# Patient Record
Sex: Female | Born: 1945
Health system: Southern US, Community
[De-identification: ages and names within clinical notes are randomized; demographics above are authoritative.]

## PROBLEM LIST (undated history)

## (undated) DIAGNOSIS — K529 Noninfective gastroenteritis and colitis, unspecified: Secondary | ICD-10-CM

## (undated) DIAGNOSIS — F419 Anxiety disorder, unspecified: Secondary | ICD-10-CM

## (undated) DIAGNOSIS — K8020 Calculus of gallbladder without cholecystitis without obstruction: Secondary | ICD-10-CM

## (undated) DIAGNOSIS — E785 Hyperlipidemia, unspecified: Secondary | ICD-10-CM

## (undated) DIAGNOSIS — R55 Syncope and collapse: Secondary | ICD-10-CM

## (undated) DIAGNOSIS — M545 Low back pain: Secondary | ICD-10-CM

## (undated) DIAGNOSIS — M8580 Other specified disorders of bone density and structure, unspecified site: Secondary | ICD-10-CM

## (undated) DIAGNOSIS — Z974 Presence of external hearing-aid: Secondary | ICD-10-CM

## (undated) DIAGNOSIS — I739 Peripheral vascular disease, unspecified: Secondary | ICD-10-CM

## (undated) DIAGNOSIS — I779 Disorder of arteries and arterioles, unspecified: Secondary | ICD-10-CM

## (undated) DIAGNOSIS — K589 Irritable bowel syndrome without diarrhea: Secondary | ICD-10-CM

## (undated) DIAGNOSIS — I10 Essential (primary) hypertension: Secondary | ICD-10-CM

## (undated) DIAGNOSIS — J302 Other seasonal allergic rhinitis: Secondary | ICD-10-CM

## (undated) DIAGNOSIS — IMO0001 Reserved for inherently not codable concepts without codable children: Secondary | ICD-10-CM

## (undated) DIAGNOSIS — Z91018 Allergy to other foods: Secondary | ICD-10-CM

## (undated) HISTORY — DX: Peripheral vascular disease, unspecified: I73.9

## (undated) HISTORY — DX: Essential (primary) hypertension: I10

## (undated) HISTORY — DX: Low back pain: M54.5

## (undated) HISTORY — DX: Hyperlipidemia, unspecified: E78.5

## (undated) HISTORY — DX: Anxiety disorder, unspecified: F41.9

## (undated) HISTORY — PX: CARPAL TUNNEL RELEASE: SHX101

## (undated) HISTORY — DX: Noninfective gastroenteritis and colitis, unspecified: K52.9

## (undated) HISTORY — DX: Other specified disorders of bone density and structure, unspecified site: M85.80

## (undated) HISTORY — PX: CHOLECYSTECTOMY: SHX55

## (undated) HISTORY — DX: Reserved for inherently not codable concepts without codable children: IMO0001

## (undated) HISTORY — DX: Irritable bowel syndrome, unspecified: K58.9

## (undated) HISTORY — DX: Syncope and collapse: R55

## (undated) HISTORY — DX: Disorder of arteries and arterioles, unspecified: I77.9

## (undated) HISTORY — DX: Calculus of gallbladder without cholecystitis without obstruction: K80.20

## (undated) HISTORY — DX: Allergy to other foods: Z91.018

---

## 2008-03-04 ENCOUNTER — Encounter: Payer: Self-pay | Admitting: Internal Medicine

## 2008-03-04 LAB — CONVERTED CEMR LAB: Pap Smear: NORMAL

## 2008-03-22 ENCOUNTER — Encounter: Payer: Self-pay | Admitting: Internal Medicine

## 2008-04-18 ENCOUNTER — Encounter: Payer: Self-pay | Admitting: Internal Medicine

## 2008-04-18 LAB — HM COLONOSCOPY: HM Colonoscopy: NORMAL

## 2008-04-30 ENCOUNTER — Emergency Department (HOSPITAL_BASED_OUTPATIENT_CLINIC_OR_DEPARTMENT_OTHER): Admission: EM | Admit: 2008-04-30 | Discharge: 2008-04-30 | Payer: Self-pay | Admitting: Emergency Medicine

## 2008-05-19 ENCOUNTER — Encounter: Payer: Self-pay | Admitting: Internal Medicine

## 2008-05-19 LAB — HM MAMMOGRAPHY: HM Mammogram: NORMAL

## 2008-05-20 ENCOUNTER — Ambulatory Visit: Payer: Self-pay | Admitting: Internal Medicine

## 2008-05-20 DIAGNOSIS — I10 Essential (primary) hypertension: Secondary | ICD-10-CM | POA: Insufficient documentation

## 2008-05-23 LAB — CONVERTED CEMR LAB
BUN: 17 mg/dL (ref 6–23)
CO2: 31 meq/L (ref 19–32)
Calcium: 10.2 mg/dL (ref 8.4–10.5)
Chloride: 95 meq/L — ABNORMAL LOW (ref 96–112)
Creatinine, Ser: 0.9 mg/dL (ref 0.4–1.2)
GFR calc Af Amer: 81 mL/min
GFR calc non Af Amer: 67 mL/min
Glucose, Bld: 105 mg/dL — ABNORMAL HIGH (ref 70–99)
Potassium: 3.9 meq/L (ref 3.5–5.1)
Sodium: 136 meq/L (ref 135–145)

## 2008-06-22 ENCOUNTER — Telehealth (INDEPENDENT_AMBULATORY_CARE_PROVIDER_SITE_OTHER): Payer: Self-pay | Admitting: *Deleted

## 2008-08-19 ENCOUNTER — Encounter (INDEPENDENT_AMBULATORY_CARE_PROVIDER_SITE_OTHER): Payer: Self-pay | Admitting: *Deleted

## 2008-09-06 ENCOUNTER — Telehealth (INDEPENDENT_AMBULATORY_CARE_PROVIDER_SITE_OTHER): Payer: Self-pay | Admitting: *Deleted

## 2008-09-07 ENCOUNTER — Ambulatory Visit: Payer: Self-pay | Admitting: Internal Medicine

## 2008-09-09 ENCOUNTER — Encounter (INDEPENDENT_AMBULATORY_CARE_PROVIDER_SITE_OTHER): Payer: Self-pay | Admitting: *Deleted

## 2008-09-09 LAB — CONVERTED CEMR LAB: TSH: 1.35 microintl units/mL (ref 0.35–5.50)

## 2009-01-03 ENCOUNTER — Ambulatory Visit: Payer: Self-pay | Admitting: Internal Medicine

## 2009-01-05 DIAGNOSIS — R55 Syncope and collapse: Secondary | ICD-10-CM

## 2009-01-05 HISTORY — DX: Syncope and collapse: R55

## 2009-01-09 LAB — CONVERTED CEMR LAB
ALT: 24 units/L (ref 0–35)
AST: 32 units/L (ref 0–37)
Albumin: 3.9 g/dL (ref 3.5–5.2)
Alkaline Phosphatase: 57 units/L (ref 39–117)
BUN: 19 mg/dL (ref 6–23)
Basophils Absolute: 0 10*3/uL (ref 0.0–0.1)
Basophils Relative: 0.2 % (ref 0.0–3.0)
Bilirubin, Direct: 0 mg/dL (ref 0.0–0.3)
CO2: 34 meq/L — ABNORMAL HIGH (ref 19–32)
Calcium: 10.3 mg/dL (ref 8.4–10.5)
Chloride: 96 meq/L (ref 96–112)
Cholesterol: 198 mg/dL (ref 0–200)
Creatinine, Ser: 0.7 mg/dL (ref 0.4–1.2)
Eosinophils Absolute: 0.2 10*3/uL (ref 0.0–0.7)
Eosinophils Relative: 4.8 % (ref 0.0–5.0)
GFR calc non Af Amer: 89.58 mL/min (ref >60)
Glucose, Bld: 87 mg/dL (ref 70–99)
HCT: 36.8 % (ref 36.0–46.0)
HDL: 69.6 mg/dL (ref >39.00)
Hemoglobin: 12.5 g/dL (ref 12.0–15.0)
LDL Cholesterol: 114 mg/dL — ABNORMAL HIGH (ref 0–99)
Lymphocytes Relative: 21.1 % (ref 12.0–46.0)
Lymphs Abs: 0.7 10*3/uL (ref 0.7–4.0)
MCHC: 34.1 g/dL (ref 30.0–36.0)
MCV: 91.7 fL (ref 78.0–100.0)
Monocytes Absolute: 0.2 10*3/uL (ref 0.1–1.0)
Monocytes Relative: 5 % (ref 3.0–12.0)
Neutro Abs: 2.3 10*3/uL (ref 1.4–7.7)
Neutrophils Relative %: 68.9 % (ref 43.0–77.0)
Platelets: 343 10*3/uL (ref 150.0–400.0)
Potassium: 3.7 meq/L (ref 3.5–5.1)
RBC: 4.01 M/uL (ref 3.87–5.11)
RDW: 12.2 % (ref 11.5–14.6)
Sodium: 138 meq/L (ref 135–145)
Total Bilirubin: 0.5 mg/dL (ref 0.3–1.2)
Total CHOL/HDL Ratio: 3
Total Protein: 7.3 g/dL (ref 6.0–8.3)
Triglycerides: 74 mg/dL (ref 0.0–149.0)
VLDL: 14.8 mg/dL (ref 0.0–40.0)
WBC: 3.4 10*3/uL — ABNORMAL LOW (ref 4.5–10.5)

## 2009-01-31 ENCOUNTER — Encounter: Payer: Self-pay | Admitting: Internal Medicine

## 2009-01-31 ENCOUNTER — Inpatient Hospital Stay (HOSPITAL_COMMUNITY): Admission: AD | Admit: 2009-01-31 | Discharge: 2009-02-02 | Payer: Self-pay | Admitting: Internal Medicine

## 2009-01-31 ENCOUNTER — Ambulatory Visit: Payer: Self-pay | Admitting: Internal Medicine

## 2009-01-31 ENCOUNTER — Encounter: Payer: Self-pay | Admitting: Emergency Medicine

## 2009-01-31 ENCOUNTER — Ambulatory Visit: Payer: Self-pay | Admitting: Diagnostic Radiology

## 2009-01-31 DIAGNOSIS — R55 Syncope and collapse: Secondary | ICD-10-CM | POA: Insufficient documentation

## 2009-02-01 ENCOUNTER — Encounter: Payer: Self-pay | Admitting: Internal Medicine

## 2009-02-01 ENCOUNTER — Ambulatory Visit: Payer: Self-pay | Admitting: Vascular Surgery

## 2009-02-07 ENCOUNTER — Encounter (INDEPENDENT_AMBULATORY_CARE_PROVIDER_SITE_OTHER): Payer: Self-pay | Admitting: *Deleted

## 2009-02-07 ENCOUNTER — Ambulatory Visit: Payer: Self-pay | Admitting: Internal Medicine

## 2009-02-07 LAB — CONVERTED CEMR LAB: Hemoglobin: 10.4 g/dL

## 2009-02-08 ENCOUNTER — Telehealth: Payer: Self-pay | Admitting: Internal Medicine

## 2009-02-15 ENCOUNTER — Ambulatory Visit: Payer: Self-pay | Admitting: Diagnostic Radiology

## 2009-02-15 ENCOUNTER — Ambulatory Visit (HOSPITAL_BASED_OUTPATIENT_CLINIC_OR_DEPARTMENT_OTHER): Admission: RE | Admit: 2009-02-15 | Discharge: 2009-02-15 | Payer: Self-pay | Admitting: Internal Medicine

## 2009-02-17 ENCOUNTER — Ambulatory Visit: Payer: Self-pay | Admitting: Internal Medicine

## 2009-02-17 DIAGNOSIS — I779 Disorder of arteries and arterioles, unspecified: Secondary | ICD-10-CM | POA: Insufficient documentation

## 2009-02-17 DIAGNOSIS — I739 Peripheral vascular disease, unspecified: Secondary | ICD-10-CM

## 2009-02-18 DIAGNOSIS — E785 Hyperlipidemia, unspecified: Secondary | ICD-10-CM | POA: Insufficient documentation

## 2009-02-20 ENCOUNTER — Telehealth (INDEPENDENT_AMBULATORY_CARE_PROVIDER_SITE_OTHER): Payer: Self-pay | Admitting: *Deleted

## 2009-02-20 LAB — CONVERTED CEMR LAB
BUN: 12 mg/dL (ref 6–23)
Basophils Absolute: 0 10*3/uL (ref 0.0–0.1)
Basophils Relative: 0.1 % (ref 0.0–3.0)
CO2: 32 meq/L (ref 19–32)
Calcium: 9.7 mg/dL (ref 8.4–10.5)
Chloride: 97 meq/L (ref 96–112)
Creatinine, Ser: 0.8 mg/dL (ref 0.4–1.2)
Eosinophils Absolute: 0.3 10*3/uL (ref 0.0–0.7)
Eosinophils Relative: 6.1 % — ABNORMAL HIGH (ref 0.0–5.0)
GFR calc non Af Amer: 76.75 mL/min (ref >60)
Glucose, Bld: 73 mg/dL (ref 70–99)
HCT: 34.7 % — ABNORMAL LOW (ref 36.0–46.0)
Hemoglobin: 11.8 g/dL — ABNORMAL LOW (ref 12.0–15.0)
Lymphocytes Relative: 19.3 % (ref 12.0–46.0)
Lymphs Abs: 0.8 10*3/uL (ref 0.7–4.0)
MCHC: 34 g/dL (ref 30.0–36.0)
MCV: 90.9 fL (ref 78.0–100.0)
Monocytes Absolute: 0.2 10*3/uL (ref 0.1–1.0)
Monocytes Relative: 3.5 % (ref 3.0–12.0)
Neutro Abs: 3 10*3/uL (ref 1.4–7.7)
Neutrophils Relative %: 71 % (ref 43.0–77.0)
Platelets: 367 10*3/uL (ref 150.0–400.0)
Potassium: 3 meq/L — ABNORMAL LOW (ref 3.5–5.1)
RBC: 3.82 M/uL — ABNORMAL LOW (ref 3.87–5.11)
RDW: 12.8 % (ref 11.5–14.6)
Sodium: 136 meq/L (ref 135–145)
WBC: 4.3 10*3/uL — ABNORMAL LOW (ref 4.5–10.5)

## 2009-02-21 ENCOUNTER — Ambulatory Visit: Payer: Self-pay | Admitting: Internal Medicine

## 2009-02-27 ENCOUNTER — Telehealth: Payer: Self-pay | Admitting: Internal Medicine

## 2009-03-01 ENCOUNTER — Encounter: Payer: Self-pay | Admitting: Internal Medicine

## 2009-03-02 ENCOUNTER — Telehealth: Payer: Self-pay | Admitting: Internal Medicine

## 2009-03-08 ENCOUNTER — Ambulatory Visit: Payer: Self-pay | Admitting: Internal Medicine

## 2009-03-09 ENCOUNTER — Telehealth (INDEPENDENT_AMBULATORY_CARE_PROVIDER_SITE_OTHER): Payer: Self-pay | Admitting: *Deleted

## 2009-03-09 ENCOUNTER — Ambulatory Visit (HOSPITAL_BASED_OUTPATIENT_CLINIC_OR_DEPARTMENT_OTHER): Admission: RE | Admit: 2009-03-09 | Discharge: 2009-03-09 | Payer: Self-pay | Admitting: Internal Medicine

## 2009-03-09 ENCOUNTER — Ambulatory Visit: Payer: Self-pay | Admitting: Interventional Radiology

## 2009-03-10 ENCOUNTER — Ambulatory Visit: Payer: Self-pay | Admitting: Internal Medicine

## 2009-03-10 LAB — CONVERTED CEMR LAB
BUN: 14 mg/dL (ref 6–23)
CO2: 32 meq/L (ref 19–32)
Calcium: 9.8 mg/dL (ref 8.4–10.5)
Chloride: 100 meq/L (ref 96–112)
Creatinine, Ser: 0.7 mg/dL (ref 0.4–1.2)
GFR calc non Af Amer: 89.53 mL/min (ref >60)
Glucose, Bld: 98 mg/dL (ref 70–99)
Hemoglobin: 12.6 g/dL (ref 12.0–15.0)
Potassium: 3.1 meq/L — ABNORMAL LOW (ref 3.5–5.1)
Sodium: 138 meq/L (ref 135–145)

## 2009-03-22 ENCOUNTER — Telehealth: Payer: Self-pay | Admitting: Internal Medicine

## 2009-03-24 ENCOUNTER — Ambulatory Visit: Payer: Self-pay | Admitting: Internal Medicine

## 2009-03-28 LAB — CONVERTED CEMR LAB: Potassium: 3.1 meq/L — ABNORMAL LOW (ref 3.5–5.1)

## 2009-04-06 DIAGNOSIS — IMO0001 Reserved for inherently not codable concepts without codable children: Secondary | ICD-10-CM

## 2009-04-06 HISTORY — DX: Reserved for inherently not codable concepts without codable children: IMO0001

## 2009-04-07 ENCOUNTER — Ambulatory Visit: Payer: Self-pay | Admitting: Internal Medicine

## 2009-04-07 ENCOUNTER — Telehealth: Payer: Self-pay | Admitting: Internal Medicine

## 2009-04-07 LAB — CONVERTED CEMR LAB: Potassium: 5 meq/L (ref 3.5–5.1)

## 2009-04-17 ENCOUNTER — Telehealth (INDEPENDENT_AMBULATORY_CARE_PROVIDER_SITE_OTHER): Payer: Self-pay | Admitting: *Deleted

## 2009-04-17 ENCOUNTER — Ambulatory Visit: Payer: Self-pay | Admitting: Internal Medicine

## 2009-04-17 DIAGNOSIS — I498 Other specified cardiac arrhythmias: Secondary | ICD-10-CM | POA: Insufficient documentation

## 2009-04-20 ENCOUNTER — Telehealth (INDEPENDENT_AMBULATORY_CARE_PROVIDER_SITE_OTHER): Payer: Self-pay | Admitting: *Deleted

## 2009-04-21 ENCOUNTER — Ambulatory Visit: Payer: Self-pay | Admitting: Internal Medicine

## 2009-04-24 ENCOUNTER — Encounter: Payer: Self-pay | Admitting: Cardiovascular Disease

## 2009-04-24 ENCOUNTER — Ambulatory Visit: Payer: Self-pay

## 2009-04-26 ENCOUNTER — Telehealth: Payer: Self-pay | Admitting: Internal Medicine

## 2009-04-26 LAB — CONVERTED CEMR LAB: Potassium: 3.6 meq/L (ref 3.5–5.1)

## 2009-05-11 ENCOUNTER — Ambulatory Visit: Payer: Self-pay | Admitting: Internal Medicine

## 2009-05-22 ENCOUNTER — Ambulatory Visit: Payer: Self-pay | Admitting: Internal Medicine

## 2009-05-25 LAB — CONVERTED CEMR LAB
BUN: 12 mg/dL (ref 6–23)
CO2: 33 meq/L — ABNORMAL HIGH (ref 19–32)
Calcium: 10.2 mg/dL (ref 8.4–10.5)
Chloride: 97 meq/L (ref 96–112)
Creatinine, Ser: 0.7 mg/dL (ref 0.4–1.2)
GFR calc non Af Amer: 89.47 mL/min (ref >60)
Glucose, Bld: 87 mg/dL (ref 70–99)
Potassium: 4.5 meq/L (ref 3.5–5.1)
Sodium: 137 meq/L (ref 135–145)

## 2009-05-29 ENCOUNTER — Encounter: Payer: Self-pay | Admitting: Internal Medicine

## 2009-07-03 ENCOUNTER — Encounter: Payer: Self-pay | Admitting: Internal Medicine

## 2009-07-19 ENCOUNTER — Ambulatory Visit: Payer: Self-pay | Admitting: Internal Medicine

## 2009-07-28 ENCOUNTER — Telehealth: Payer: Self-pay | Admitting: Internal Medicine

## 2009-08-04 ENCOUNTER — Ambulatory Visit: Payer: Self-pay | Admitting: Internal Medicine

## 2009-08-07 LAB — CONVERTED CEMR LAB
ALT: 23 units/L (ref 0–35)
AST: 31 units/L (ref 0–37)
Cholesterol: 148 mg/dL (ref 0–200)
HDL: 58.9 mg/dL (ref >39.00)
LDL Cholesterol: 75 mg/dL (ref 0–99)
Total CHOL/HDL Ratio: 3
Triglycerides: 70 mg/dL (ref 0.0–149.0)
VLDL: 14 mg/dL (ref 0.0–40.0)

## 2009-09-13 IMAGING — US US AORTA SCREENING (MEDICARE)
1 series · 14 of 25 positions shown · non-contrast
Comparison: 04/30/2008

CLINICAL DATA: Abdominal mass

AORTA ULTRASOUND SCREENING

[Series 1: us aorta screening (medicare) · 0.22mm/px · 14 of 30 slices shown]
[im 1/30]
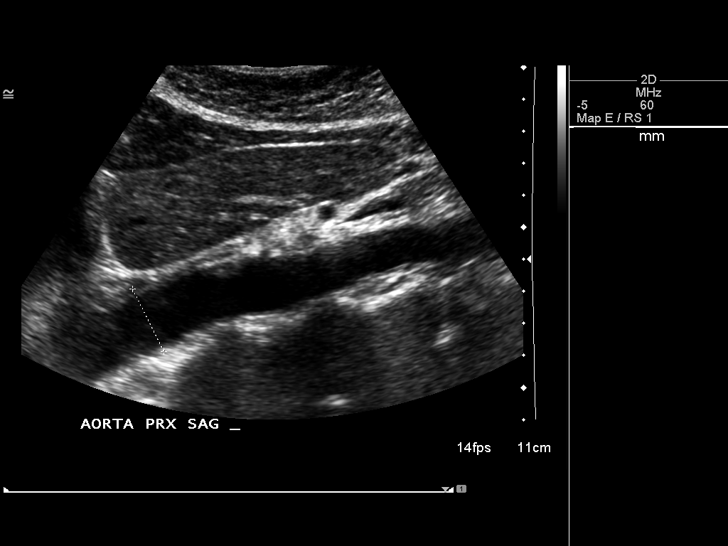
[im 3/30]
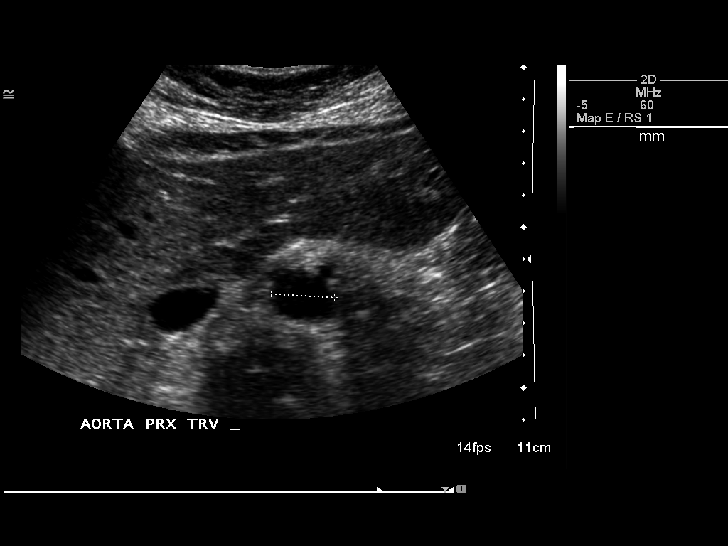
[im 5/30]
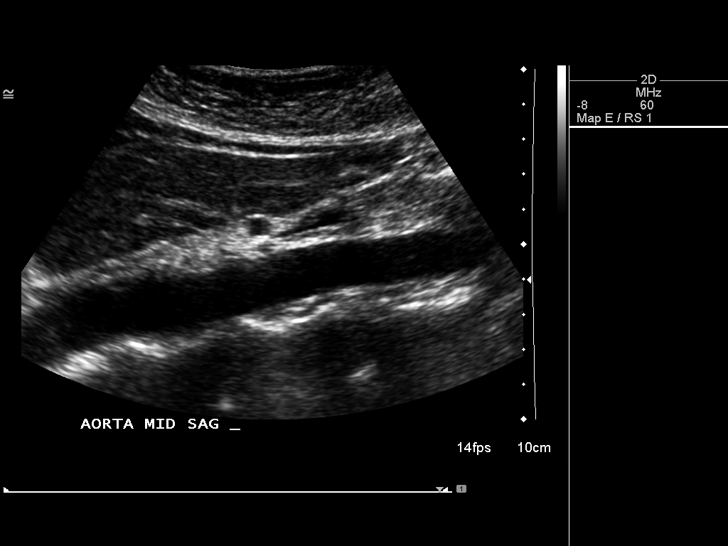
[im 8/30]
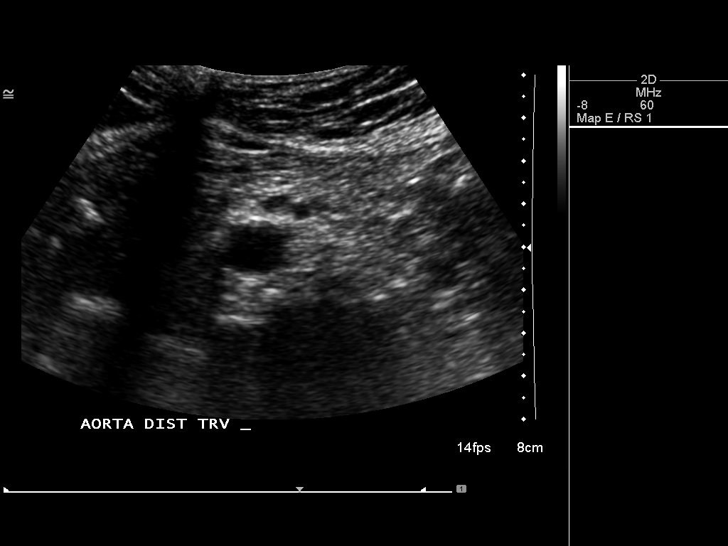
[im 10/30]
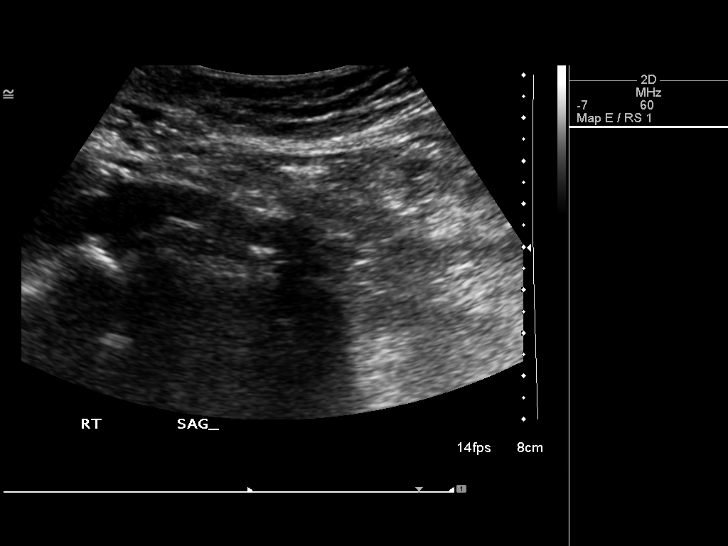
[im 11/30]
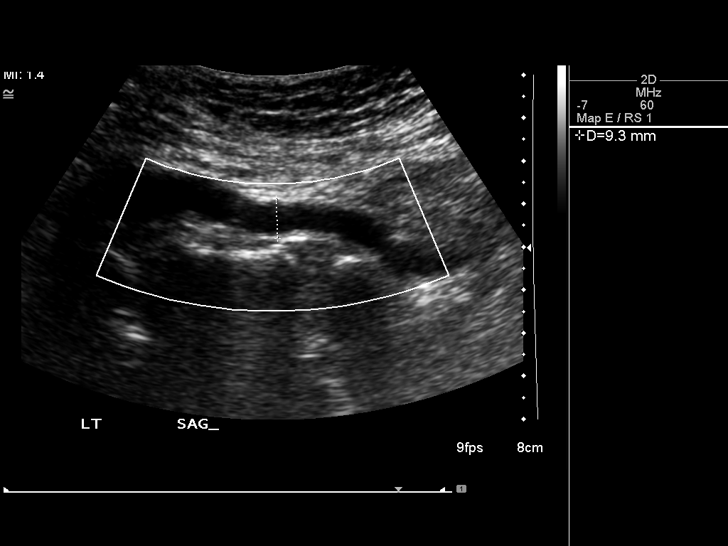
[im 14/30]
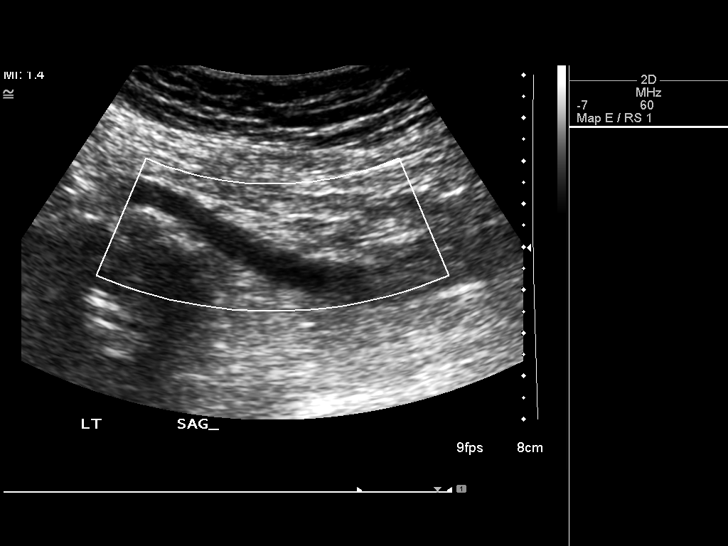
[im 16/30]
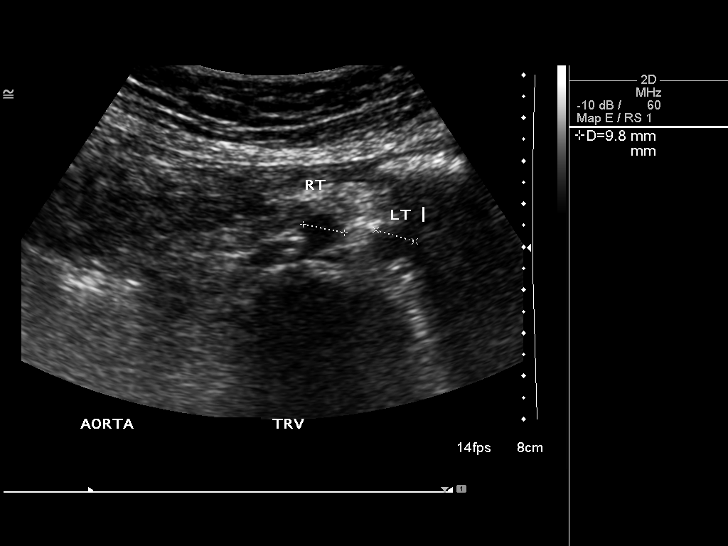
[im 19/30]
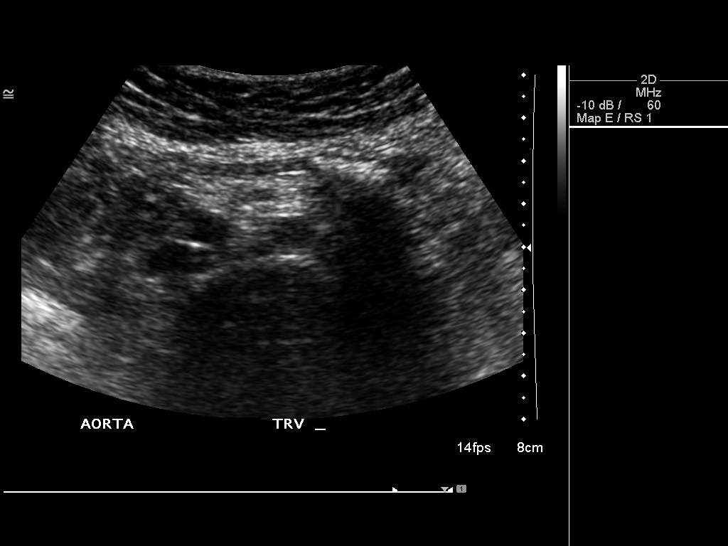
[im 20/30]
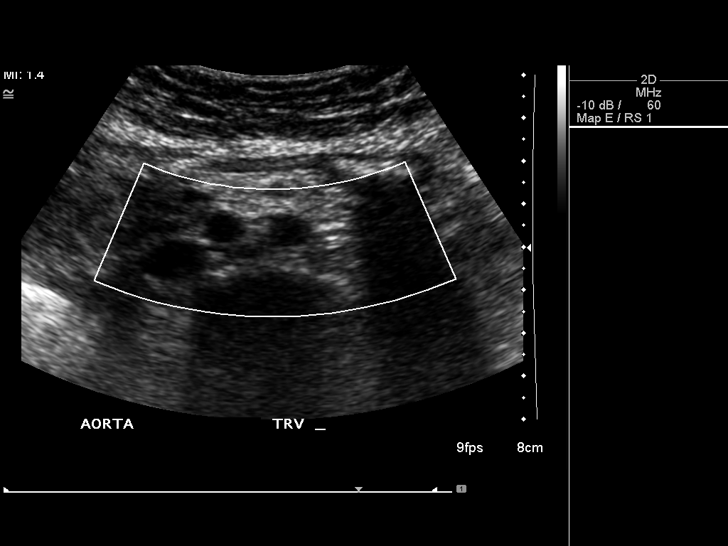
[im 22/30]
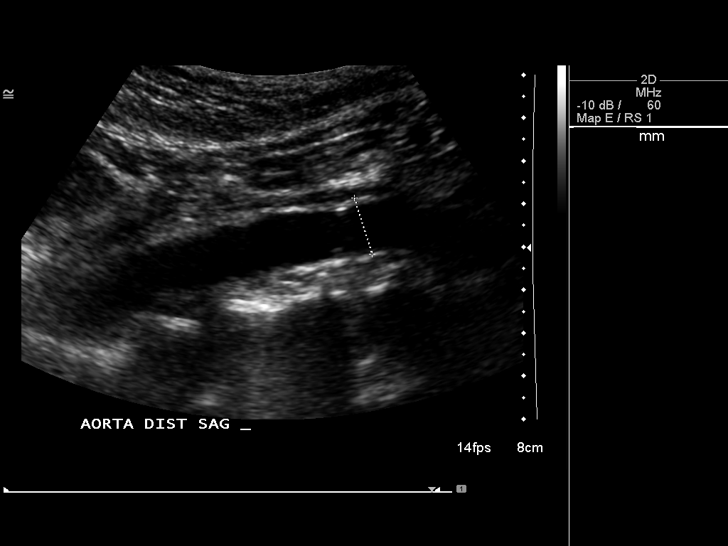
[im 25/30]
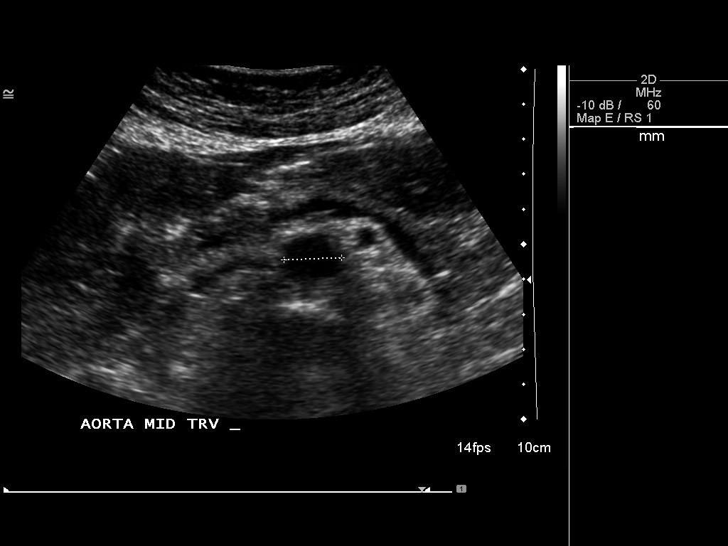
[im 27/30]
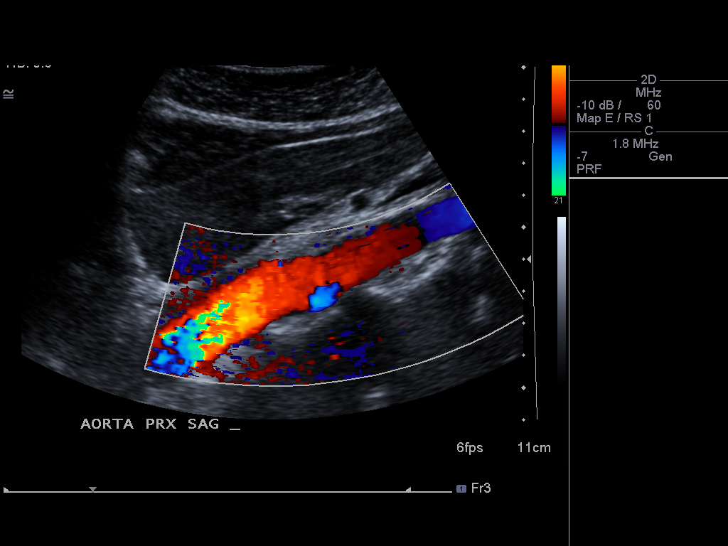
[im 30/30]
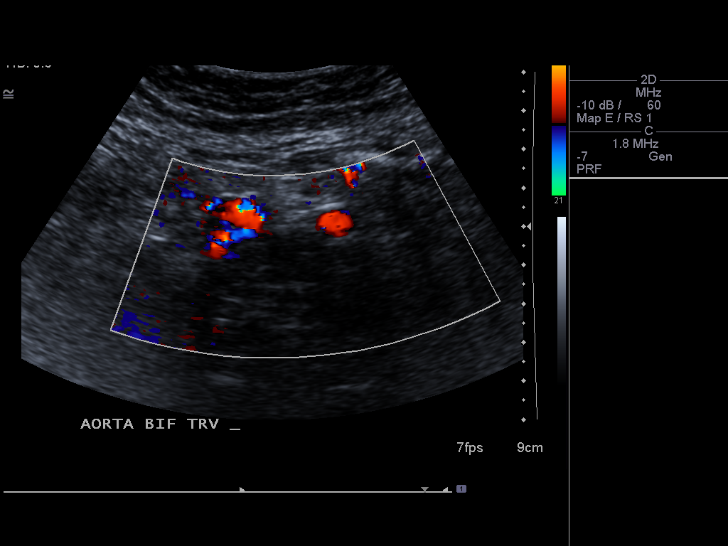

[14 of 25 positions shown; findings below may reference images not displayed]

FINDINGS: Abdominal Aorta:
              Maximum AP diameter:   2.2 cm

        Maximum TRV diameter:  2.0 cm.

Right Common Iliac Artery:  0.9 cm

Left Common Iliac Artery:  0.9 cm
IMPRESSION: Negative for abdominal aortic aneurysm

## 2009-10-02 ENCOUNTER — Telehealth: Payer: Self-pay | Admitting: Internal Medicine

## 2009-11-29 ENCOUNTER — Encounter (INDEPENDENT_AMBULATORY_CARE_PROVIDER_SITE_OTHER): Payer: Self-pay | Admitting: *Deleted

## 2009-11-29 ENCOUNTER — Telehealth: Payer: Self-pay | Admitting: Internal Medicine

## 2009-12-05 DIAGNOSIS — M545 Low back pain, unspecified: Secondary | ICD-10-CM

## 2009-12-05 HISTORY — DX: Low back pain, unspecified: M54.50

## 2009-12-11 ENCOUNTER — Telehealth: Payer: Self-pay | Admitting: Internal Medicine

## 2009-12-29 ENCOUNTER — Ambulatory Visit: Payer: Self-pay | Admitting: Internal Medicine

## 2009-12-29 ENCOUNTER — Telehealth: Payer: Self-pay | Admitting: Internal Medicine

## 2010-01-01 ENCOUNTER — Telehealth (INDEPENDENT_AMBULATORY_CARE_PROVIDER_SITE_OTHER): Payer: Self-pay | Admitting: *Deleted

## 2010-01-01 ENCOUNTER — Telehealth: Payer: Self-pay | Admitting: Internal Medicine

## 2010-01-02 ENCOUNTER — Ambulatory Visit: Payer: Self-pay | Admitting: Diagnostic Radiology

## 2010-01-02 ENCOUNTER — Telehealth (INDEPENDENT_AMBULATORY_CARE_PROVIDER_SITE_OTHER): Payer: Self-pay | Admitting: *Deleted

## 2010-01-02 ENCOUNTER — Ambulatory Visit (HOSPITAL_BASED_OUTPATIENT_CLINIC_OR_DEPARTMENT_OTHER): Admission: RE | Admit: 2010-01-02 | Discharge: 2010-01-02 | Payer: Self-pay | Admitting: Internal Medicine

## 2010-01-03 ENCOUNTER — Encounter (INDEPENDENT_AMBULATORY_CARE_PROVIDER_SITE_OTHER): Payer: Self-pay | Admitting: *Deleted

## 2010-01-10 ENCOUNTER — Telehealth: Payer: Self-pay | Admitting: Internal Medicine

## 2010-01-12 ENCOUNTER — Ambulatory Visit: Payer: Self-pay | Admitting: Diagnostic Radiology

## 2010-01-12 ENCOUNTER — Ambulatory Visit (HOSPITAL_BASED_OUTPATIENT_CLINIC_OR_DEPARTMENT_OTHER): Admission: RE | Admit: 2010-01-12 | Discharge: 2010-01-12 | Payer: Self-pay | Admitting: Internal Medicine

## 2010-01-19 ENCOUNTER — Telehealth (INDEPENDENT_AMBULATORY_CARE_PROVIDER_SITE_OTHER): Payer: Self-pay | Admitting: *Deleted

## 2010-02-02 ENCOUNTER — Telehealth (INDEPENDENT_AMBULATORY_CARE_PROVIDER_SITE_OTHER): Payer: Self-pay | Admitting: *Deleted

## 2010-02-10 ENCOUNTER — Ambulatory Visit (HOSPITAL_BASED_OUTPATIENT_CLINIC_OR_DEPARTMENT_OTHER): Admission: RE | Admit: 2010-02-10 | Discharge: 2010-02-10 | Payer: Self-pay | Admitting: Unknown Physician Specialty

## 2010-02-10 ENCOUNTER — Ambulatory Visit: Payer: Self-pay | Admitting: Diagnostic Radiology

## 2010-03-29 ENCOUNTER — Telehealth (INDEPENDENT_AMBULATORY_CARE_PROVIDER_SITE_OTHER): Payer: Self-pay | Admitting: *Deleted

## 2010-04-06 ENCOUNTER — Encounter: Payer: Self-pay | Admitting: Internal Medicine

## 2010-04-13 ENCOUNTER — Ambulatory Visit: Payer: Self-pay | Admitting: Internal Medicine

## 2010-04-16 ENCOUNTER — Encounter: Admission: RE | Admit: 2010-04-16 | Discharge: 2010-05-23 | Payer: Self-pay | Admitting: Sports Medicine

## 2010-04-17 ENCOUNTER — Ambulatory Visit (HOSPITAL_BASED_OUTPATIENT_CLINIC_OR_DEPARTMENT_OTHER): Admission: RE | Admit: 2010-04-17 | Discharge: 2010-04-17 | Payer: Self-pay | Admitting: Internal Medicine

## 2010-04-17 ENCOUNTER — Ambulatory Visit: Payer: Self-pay | Admitting: Diagnostic Radiology

## 2010-04-18 LAB — CONVERTED CEMR LAB
ALT: 28 units/L (ref 0–35)
AST: 39 units/L — ABNORMAL HIGH (ref 0–37)
BUN: 17 mg/dL (ref 6–23)
Basophils Absolute: 0 10*3/uL (ref 0.0–0.1)
Basophils Relative: 0 % (ref 0–1)
CO2: 24 meq/L (ref 19–32)
Calcium: 10 mg/dL (ref 8.4–10.5)
Chloride: 97 meq/L (ref 96–112)
Cholesterol: 181 mg/dL (ref 0–200)
Creatinine, Ser: 0.73 mg/dL (ref 0.40–1.20)
Eosinophils Absolute: 0.1 10*3/uL (ref 0.0–0.7)
Eosinophils Relative: 2 % (ref 0–5)
Glucose, Bld: 87 mg/dL (ref 70–99)
HCT: 36.9 % (ref 36.0–46.0)
HDL: 78 mg/dL (ref >39)
Hemoglobin: 12 g/dL (ref 12.0–15.0)
LDL Cholesterol: 86 mg/dL (ref 0–99)
Lymphocytes Relative: 22 % (ref 12–46)
Lymphs Abs: 1.3 10*3/uL (ref 0.7–4.0)
MCHC: 32.5 g/dL (ref 30.0–36.0)
MCV: 93.2 fL (ref 78.0–100.0)
Monocytes Absolute: 0.4 10*3/uL (ref 0.1–1.0)
Monocytes Relative: 6 % (ref 3–12)
Neutro Abs: 4.3 10*3/uL (ref 1.7–7.7)
Neutrophils Relative %: 70 % (ref 43–77)
Platelets: 359 10*3/uL (ref 150–400)
Potassium: 5 meq/L (ref 3.5–5.3)
RBC: 3.96 M/uL (ref 3.87–5.11)
RDW: 14.2 % (ref 11.5–15.5)
Sodium: 135 meq/L (ref 135–145)
TSH: 2.663 microintl units/mL (ref 0.350–4.500)
Total CHOL/HDL Ratio: 2.3
Triglycerides: 87 mg/dL (ref <150)
VLDL: 17 mg/dL (ref 0–40)
Vit D, 25-Hydroxy: 54 ng/mL (ref 30–89)
WBC: 6.1 10*3/uL (ref 4.0–10.5)

## 2010-04-26 ENCOUNTER — Encounter: Payer: Self-pay | Admitting: Internal Medicine

## 2010-04-26 ENCOUNTER — Encounter: Admission: RE | Admit: 2010-04-26 | Discharge: 2010-04-26 | Payer: Self-pay | Admitting: Internal Medicine

## 2010-05-09 ENCOUNTER — Telehealth: Payer: Self-pay | Admitting: Internal Medicine

## 2010-05-18 ENCOUNTER — Telehealth (INDEPENDENT_AMBULATORY_CARE_PROVIDER_SITE_OTHER): Payer: Self-pay | Admitting: *Deleted

## 2010-10-18 ENCOUNTER — Telehealth (INDEPENDENT_AMBULATORY_CARE_PROVIDER_SITE_OTHER): Payer: Self-pay | Admitting: *Deleted

## 2010-10-22 ENCOUNTER — Encounter: Payer: Self-pay | Admitting: Internal Medicine

## 2010-10-22 ENCOUNTER — Ambulatory Visit
Admission: RE | Admit: 2010-10-22 | Discharge: 2010-10-22 | Payer: Self-pay | Source: Home / Self Care | Attending: Internal Medicine | Admitting: Internal Medicine

## 2010-10-22 ENCOUNTER — Other Ambulatory Visit: Payer: Self-pay | Admitting: Internal Medicine

## 2010-10-22 DIAGNOSIS — M8580 Other specified disorders of bone density and structure, unspecified site: Secondary | ICD-10-CM | POA: Insufficient documentation

## 2010-10-22 LAB — BASIC METABOLIC PANEL
BUN: 24 mg/dL — ABNORMAL HIGH (ref 6–23)
CO2: 29 mEq/L (ref 19–32)
Calcium: 9.7 mg/dL (ref 8.4–10.5)
Chloride: 98 mEq/L (ref 96–112)
Creatinine, Ser: 0.9 mg/dL (ref 0.4–1.2)
GFR: 65.8 mL/min (ref >60.00)
Glucose, Bld: 92 mg/dL (ref 70–99)
Potassium: 4.7 mEq/L (ref 3.5–5.1)
Sodium: 134 mEq/L — ABNORMAL LOW (ref 135–145)

## 2010-10-29 ENCOUNTER — Encounter: Payer: Self-pay | Admitting: Internal Medicine

## 2010-11-06 NOTE — Assessment & Plan Note (Signed)
Summary: CPX//KN--Rm 11   Vital Signs:  Patient profile:   65 year old female Height:      65 inches Weight:      134.38 pounds BMI:     22.44 Temp:     97.2 degrees F oral Pulse rate:   84 / minute Pulse rhythm:   regular Resp:     16 per minute BP sitting:   148 / 98  (right arm) Cuff size:   regular  Vitals Entered By: Mervin Kung CMA Duncan Dull) (April 13, 2010 1:58 PM) CC: Room 11  Physical, fasting.   Comments Pt has completed Prednisone and Vicodin.  Pt states she starts physical therapy on Monday for her shoulder and back. Pt states she had bone density ?years ago. Does not remember date of last mammogram but it was sometime last year.  Just had pap smear in 04/11/10 but doesn't have result yet.   History of Present Illness: CPX was seen 3-11 w/  back pain, MRI 5-11, showed 2 disks w/  problems, s/p 2 shots pain is gone also had shoulder pain  will start PT for both soon      Allergies: 1)  ! * Nyquil 2)  ! * Flecainide  Past History:  Past Medical History: Syncope 01-2009 ----> holter atrial tachycardia normal stress test 04-2009 G3 P3 Hypertension Carotid Dz per u/s 01-2009 Hyperlipidemia LBP 3-11, better after a local injection  Past Surgical History: Reviewed history from 05/20/2008 and no changes required. Cholecystectomy Carpal tunnel release  Family History: Reviewed history from 01/03/2009 and no changes required. CAD - M onset on her 65s  HTN - M family Stroke - M DM - M breast Ca - M,  Aunt, M colon Ca - no father deceased  - cerebral hemorrhage  Social History: Married 3 children Never Smoked Alcohol use-yes occupation-- Engineer, site diet-- healthy  exerc ise-- limited d/t pain  Review of Systems CV:  Denies chest pain or discomfort and swelling of feet. Resp:  Denies shortness of breath; occasionally cough, better  usually after treatment for allergies . GI:  Denies bloody stools, nausea, and vomiting. GU:  no vag d/c or  bleed . Psych:  Denies anxiety and depression.  Physical Exam  General:  alert, well-developed, and well-nourished.   Neck:  no masses, no thyromegaly, and normal carotid upstroke.   Lungs:  normal respiratory effort, no intercostal retractions, no accessory muscle use, and normal breath sounds.   Heart:  normal rate, regular rhythm, and no murmur.   Abdomen:  soft, non-tender, normal bowel sounds, no distention, no masses, no guarding, and no rigidity.   Extremities:  no pretibial edema bilaterally  Neurologic:  alert & oriented X3, strength normal in all extremities, and gait normal.   Psych:  Cognition and judgment appear intact. Alert and cooperative with normal attention span and concentration.     Impression & Recommendations:  Problem # 1:  HEALTH SCREENING (ICD-V70.0) TD 5-10 pneumonia shot -- declined diet and  exercise discussed  PAP per gyn last MMG 9-10  Colonoscopy:  04/18/2008, hemorrhoids. GI rec iFOB starting 2012, Cscope 2019   last DEXA?  ordering one --- [facility] to go to [facility]  Orders: Venipuncture ([address]) T-Vitamin D (25-Hydroxy) [phone]) Radiology Referral (Radiology)  Problem # 2:  CAROTID ARTERY DISEASE (ICD-433.10) carotid ultrasound  01/2009------->   Right: Mild to moderate, homogeneous plaque noted in the proximal  ICA with 40-60% stenosis. Left: Mild, mixed plaque noted in the  proximal ICA with 60-80% (lower end of range) stenosis. ECA stenosis  noted.  plan: Ordered in a  carotid ultrasound----- likes it done at [facility]  Her updated medication list for this problem includes:    Aspirin Ec 325 Mg Tbec (Aspirin) .Marland Kitchen... 1 a day  Orders: Radiology Referral (Radiology)  Problem # 3:  chronic medical problems ordering labs Followup in 4 months  Complete Medication List: 1)  Lotrel 5-10 Mg Caps (Amlodipine besy-benazepril hcl) .Marland Kitchen.. 1 by mouth once daily 2)  Potassium Chloride 100  .... Take 1 tab once daily as directed 3)   Hydrochlorothiazide 12.5 Mg Caps (Hydrochlorothiazide) .Marland Kitchen.. 1 by mouth once daily 4)  Evista 60 Mg Tabs (Raloxifene hcl) .... Once daily 5)  Simvastatin 20 Mg Tabs (Simvastatin) .Marland Kitchen.. 1 by mouth qhs 6)  Clonazepam 0.5 Mg Tabs (Clonazepam) .... Take 1 tab p.o. t.i.d. p.r.n. 7)  Flonase 50 Mcg/act Susp (Fluticasone propionate) .... As needed 8)  Loratadine 10 Mg Tabs (Loratadine) .... As needed 9)  Aspirin Ec 325 Mg Tbec (Aspirin) .Marland Kitchen.. 1 a day 10)  One Source Mvi/d  11)  Propecia 1 Mg Tabs (Finasteride) .... Once daily 12)  Colon Health Probiotic Supp  13)  Anamantle Hc 3-0.5 % Crea (Lidocaine-hydrocortisone ace) .... Apply as needed  Patient Instructions: 1)  Please schedule a follow-up appointment in 4 months .   Current Allergies (reviewed today): ! * NYQUIL ! * FLECAINIDE   Preventive Care Screening  Last Tetanus Booster:    Date:  02/04/2009    Results:  Historical

## 2010-11-06 NOTE — Progress Notes (Signed)
Summary: MRI   Phone Note Call from Patient   Caller: Patient Complaint: Urinary/GYN Problems Summary of Call: Pt was seen by orth and was given a injection which help some but now pain has return. PT is request a MRI of rt hip to be order. pt states that she was told by dr kindle if pain return and it went into her back she would need a MRI. Pt states that our office is closer and would like to know if dr Drue Novel is willing to order test versus her having to drive all the way to orth. pls advise.................Marland KitchenFelecia Deloach CMA  February 02, 2010 1:27 PM    Follow-up for Phone Call        pt advise it would be better for orth to order MRI since it was suggested by them and they are aware of pt current condition. pt ok........................Marland KitchenFelecia Deloach CMA  February 02, 2010 1:33 PM

## 2010-11-06 NOTE — Progress Notes (Signed)
Summary: refill  Phone Note Refill Request Message from:  Fax from Pharmacy on May 09, 2010 9:30 AM  Refills Requested: Medication #1:  CLONAZEPAM 0.5 MG  TABS take 1 tab p.o. t.i.d. p.r.n. [facility] - fax [phone]  Initial call taken by: Dannielle Karvonen,  May 09, 2010 9:30 AM  Follow-up for Phone Call        okay #90, 3 refills Follow-up by: [facility] E. Paz MD,  May 09, 2010 1:13 PM    Prescriptions: CLONAZEPAM 0.5 MG  TABS (CLONAZEPAM) take 1 tab p.o. t.i.d. p.r.n.  #90 x 3   Entered by:   Army Fossa CMA   Authorized by:   Nolon Rod. Paz MD   Signed by:   Army Fossa CMA on 05/09/2010   Method used:   Printed then faxed to ...       [facility]  [address] [phone]* (retail)       [address]       [place], [place]  [address]       Ph: [phone] or [phone]       Fax: [phone]   RxID:   [id number]

## 2010-11-06 NOTE — Assessment & Plan Note (Signed)
Summary: pain in leg/kdc   Vital Signs:  Patient profile:   65 year old female Height:      65 inches Weight:      135.4 pounds Temp:     99 degrees F BP sitting:   112 / 80  Vitals Entered By: Shary Decamp (December 29, 2009 9:07 AM) CC: acute only - WALK IN Comments  - rt leg pain (thigh - front), left arm pain  - achy feeling  - no known injury Shary Decamp  December 29, 2009 9:21 AM    History of Present Illness: acute only - WALK IN one week history of pain in the right leg, like a ache  pain starts at the right hip  by the trochanteric bursa and run down to the anterior thigh   x the  last few days goes also down to the right pretibial area pain is worse with walking slightly getting worse Tylenol did not help  review of systems  no known injury, no recent overuse denies fever or rash no back pain no bladder or bowel incontinence  Current Medications (verified): 1)  Lotrel 10-20 Mg Caps (Amlodipine Besy-Benazepril Hcl) .Marland Kitchen.. 1 By Mouth Once Daily 2)  Potassium Chloride 100 .... Take 1 Tab Once Daily As Directed 3)  Hydrochlorothiazide 12.5 Mg Caps (Hydrochlorothiazide) .Marland Kitchen.. 1 By Mouth Once Daily 4)  Evista 60 Mg  Tabs (Raloxifene Hcl) .... Once Daily 5)  Simvastatin 20 Mg Tabs (Simvastatin) .Marland Kitchen.. 1 By Mouth Qhs 6)  Clonazepam 0.5 Mg  Tabs (Clonazepam) .... Take 1 Tab P.o. T.i.d. P.r.n. 7)  Flonase 50 Mcg/act  Susp (Fluticasone Propionate) .... As Needed 8)  Loratadine 10 Mg  Tabs (Loratadine) .... As Needed 9)  Aspirin Ec 325 Mg Tbec (Aspirin) .Marland Kitchen.. 1 A Day 10)  One Source Mvi/d 11)  Propecia 1 Mg  Tabs (Finasteride) .... Once Daily 12)  Colon Health Probiotic Supp 13)  Anamantle Hc 3-0.5 % Crea (Lidocaine-Hydrocortisone Ace) .... Apply As Needed  Allergies (verified): 1)  ! * Nyquil 2)  ! * Flecainide  Past History:  Past Medical History: Reviewed history from 05/11/2009 and no changes required. Syncope 01-2009 ----> holter atrial tachycardia normal stress  test 04-2009 G3 P3 Hypertension Carotid Dz per u/s 01-2009 Hyperlipidemia  Past Surgical History: Reviewed history from 05/20/2008 and no changes required. Cholecystectomy Carpal tunnel release  Social History: Reviewed history from 01/03/2009 and no changes required. Married 3 children Never Smoked Alcohol use-yes  Review of Systems      See HPI  Physical Exam  General:  alert and well-developed.   Abdomen:  soft and non-tender.   Msk:  inspection and palpation of the legs is normal except for mild tenderness in the anterior right thigh. no tender to palpation in the back of Extremities:  no lower extremity edema tender at the right trochanteric area Neurologic:  generalized hypo-reflexia except for the reasons motor exam symmetric and normal Skin:  no rash   Impression & Recommendations:  Problem # 1:  LEG PAIN, RIGHT (ICD-729.5) right leg pain with tenderness over the right trochanteric bursa neurological exam is symmetric, she does have generalized hyporeflexia plan treat as trochanteric bursitis, see instructions  Problem # 2:  HYPOREFLEXIA (ICD-796.1) likely not related to leg pain, unclear etiology neurology referral  Complete Medication List: 1)  Lotrel 10-20 Mg Caps (Amlodipine besy-benazepril hcl) .Marland Kitchen.. 1 by mouth once daily 2)  Potassium Chloride 100  .... Take 1 tab once daily as directed 3)  Hydrochlorothiazide 12.5 Mg Caps (Hydrochlorothiazide) .Marland Kitchen.. 1 by mouth once daily 4)  Evista 60 Mg Tabs (Raloxifene hcl) .... Once daily 5)  Simvastatin 20 Mg Tabs (Simvastatin) .Marland Kitchen.. 1 by mouth qhs 6)  Clonazepam 0.5 Mg Tabs (Clonazepam) .... Take 1 tab p.o. t.i.d. p.r.n. 7)  Flonase 50 Mcg/act Susp (Fluticasone propionate) .... As needed 8)  Loratadine 10 Mg Tabs (Loratadine) .... As needed 9)  Aspirin Ec 325 Mg Tbec (Aspirin) .Marland Kitchen.. 1 a day 10)  One Source Mvi/d  11)  Propecia 1 Mg Tabs (Finasteride) .... Once daily 12)  Colon Health Probiotic Supp  13)   Anamantle Hc 3-0.5 % Crea (Lidocaine-hydrocortisone ace) .... Apply as needed 14)  Prednisone 10 Mg Tabs (Prednisone) .... 4x2,3x2,2x2,1x2 15)  Vicodin 5-500 Mg Tabs (Hydrocodone-acetaminophen) .... One or two by mouth every 8 hours as needed for pain  Patient Instructions: 1)  rest 2)  cold compress 3)  prednisone as prescribed 4)  Vicodin as needed, watch for somnolence  5)  call if no better in two weeks Prescriptions: VICODIN 5-500 MG TABS (HYDROCODONE-ACETAMINOPHEN) one or two by mouth every 8 hours as needed for pain  #40 x 0   Entered and Authorized by:   Nolon Rod. Jahron Hunsinger MD   Signed by:   Nolon Rod. Deshane Cotroneo MD on 12/29/2009   Method used:   Print then Give to Patient   RxID:   [id number] PREDNISONE 10 MG TABS (PREDNISONE) 4x2,3x2,2x2,1x2  #20 x 0   Entered and Authorized by:   Nolon Rod. Derreon Consalvo MD   Signed by:   Nolon Rod. Lucely Leard MD on 12/29/2009   Method used:   Print then Give to Patient   RxID:   [id number]

## 2010-11-06 NOTE — Progress Notes (Signed)
Summary: fyi -   Phone Note Call from Patient   Summary of Call: Patient states that she has only been taking lotrel 5/10 not 10/20.  spoke with pharmacist -- has been getting 5/10 the last several refills.  BP ok - will leave @ 5/10 -- advised to monitor bp Shary Decamp  January 01, 2010 4:15 PM     New/Updated Medications: LOTREL 5-10 MG CAPS (AMLODIPINE BESY-BENAZEPRIL HCL) 1 by mouth once daily Prescriptions: LOTREL 5-10 MG CAPS (AMLODIPINE BESY-BENAZEPRIL HCL) 1 by mouth once daily  #30 x 3   Entered by:   Shary Decamp   Authorized by:   Nolon Rod. Simcha Farrington MD   Signed by:   Shary Decamp on 01/01/2010   Method used:   [facility]        [facility]  [address] [phone]* (retail)       [address]       [place], [place]  [address]       Ph: [phone] or [phone]       Fax: [phone]   RxID:   [id number]

## 2010-11-06 NOTE — Assessment & Plan Note (Signed)
Summary: new pt   Vital Signs:  Patient Profile:   65 Years Old Female Weight:      122.6 pounds Pulse rate:   90 / minute BP sitting:   120 / 70  Vitals Entered By: Shary Decamp (May 20, 2008 9:53 AM)                 Chief Complaint:  new pt to est; pt states that since having colonoscopy on 7/13 -- she has been having abd cramps/diarrhea -- she started taking probiotic supplement.  History of Present Illness: new pt to est;  pt states that since having colonoscopy on 04/18/08 she has been having abd cramps/diarrhea/gas/bloated   went to the ER 7-25....Marland KitchenCHART REVIEWED    CT abd-pelvis:  Abnormal appearance of the colon from the transverse to the   sigmoid is compatible with colitis likely infectious or   inflammatory.  Ischemic colitis is within the differential but felt   much less likely.  WBC was normal, Cdiff was neg  Sodium (NA)                              130        l      135-145          mEq/L  Potassium (K)                            2.8        l      3.5-5.1          mEq/L  Chloride                                 88         l      96-112           mEq/L  CO2                                      34         h      19-32            mEq/L  Glucose                                  110        h      70-99            mg/dL  BUN                                      10                6-23             mg/dL  Creatinine                               .8                0.4-1.2  mg/dL  Bilirubin, Total                         0.6               0.3-1.2          mg/dL  Alkaline Phosphatase                     99                39-117           U/L  SGOT (AST)                               46         h      0-37             U/L  SGPT (ALT)                               50         h      0-35             U/L  Total  Protein                           7.2               6.0-8.3          g/dL  Albumin-Blood                            4.2               3.5-5.2           g/dL  Calcium                                  9.7               8.4-10.5         mg/dL  was Rx Flagyl-cipro-K Saw GI, K was recheck and it was normal GI Rx a low fiber diet has used Lomotil, she started taking probiotic supplement    Updated Prior Medication List: EVISTA 60 MG  TABS (RALOXIFENE HCL) once daily LOTREL 10-20 MG  CAPS (AMLODIPINE BESY-BENAZEPRIL HCL) once daily HYDROCHLOROTHIAZIDE 25 MG  TABS (HYDROCHLOROTHIAZIDE) 2 once daily CLONAZEPAM 0.5 MG  TABS (CLONAZEPAM) 2-3 once daily FLONASE 50 MCG/ACT  SUSP (FLUTICASONE PROPIONATE) as needed LORATADINE 10 MG  TABS (LORATADINE) as needed BAYER ASPIRIN EC LOW DOSE 81 MG  TBEC (ASPIRIN) once daily * ONE SOURCE MVI/D  PROPECIA 1 MG  TABS (FINASTERIDE) once daily * COLON HEALTH PROBIOTIC SUPP   Current Allergies (reviewed today): ! * NYQUIL  Past Medical History:    G3 P3    Hypertension  Past Surgical History:    Reviewed history and no changes required:       Cholecystectomy       Carpal tunnel release   Family History:    CAD - M    HTN - M family    Stroke - M    DM - M  breast Ca - M. Aunt, M    colon Ca - no    father deceased  - cerebral hemorrhage  Social History:    Married    Never Smoked    Alcohol use-yes   Risk Factors:  Tobacco use:  never Alcohol use:  yes  Mammogram History:     Date of Last Mammogram:  05/19/2008    Results:  normal   Colonoscopy History:     Date of Last Colonoscopy:  04/18/2008    Results:  normal   PAP Smear History:     Date of Last PAP Smear:  03/04/2008    Results:  normal    Review of Systems       never had fever At this point appetite better, cramp resolved still has diarrhea: was bloody but now w/o blood, it is watery lonh h/o hemorrhoids, started w/ pain 1 wek ago, no bleed (Wonders if needs surgery for them?)  GI      Denies nausea.   Physical Exam  General:     alert, well-developed, and well-nourished.   Lungs:     normal  respiratory effort, no intercostal retractions, no accessory muscle use, and normal breath sounds.   Heart:     normal rate, regular rhythm, and no murmur.   Abdomen:     soft, non-tender, no distention, no hepatomegaly, and no splenomegaly.  Increased BS Rectal:     normal sphincter tone, no masses, no tenderness, no perianal rash, and  + external hemorrhoid(s), slt erythema , no bleed Extremities:     no pretibial edema bilaterally     Impression & Recommendations:  Problem # 1:  HYPERTENSION (ICD-401.9) no change Her updated medication list for this problem includes:    Lotrel 10-20 Mg Caps (Amlodipine besy-benazepril hcl) ..... Once daily    Hydrochlorothiazide 25 Mg Tabs (Hydrochlorothiazide) .Marland Kitchen... 2 once daily  BP today: 120/70  Orders: Venipuncture (04540) TLB-BMP (Basic Metabolic Panel-BMET) (80048-METABOL)   Problem # 2:  HEMORRHOIDS, EXTERNAL (ICD-455.3) pt concern about the need for surgery Rx anamantle no need for surgery at this point  Problem # 3:  ABDOMINAL PAIN (ICD-789.00) improved rec.  to d/w GI if sx increased but  pt states she will call me  Problem # 4:  multiple questions answer  Problem # 5:  HEALTH SCREENING (ICD-V70.0) Mammogram History:     Date of Last Mammogram:  05/19/2008    Results:  normal   Colonoscopy History:     Date of Last Colonoscopy:  04/18/2008    Results:  normal   PAP Smear History:     Date of Last PAP Smear:  03/04/2008    Results:  normal   Complete Medication List: 1)  Evista 60 Mg Tabs (Raloxifene hcl) .... Once daily 2)  Lotrel 10-20 Mg Caps (Amlodipine besy-benazepril hcl) .... Once daily 3)  Hydrochlorothiazide 25 Mg Tabs (Hydrochlorothiazide) .... 2 once daily 4)  Clonazepam 0.5 Mg Tabs (Clonazepam) .... 2-3 once daily 5)  Flonase 50 Mcg/act Susp (Fluticasone propionate) .... As needed 6)  Loratadine 10 Mg Tabs (Loratadine) .... As needed 7)  Bayer Aspirin Ec Low Dose 81 Mg Tbec (Aspirin) .... Once  daily 8)  One Source Mvi/d  9)  Propecia 1 Mg Tabs (Finasteride) .... Once daily 10)  Colon Health Probiotic Supp  11)  Anamantle Hc 3-0.5 % Crea (Lidocaine-hydrocortisone ace) .... Apply two times a day x 7 days   Patient Instructions: 1)  Please schedule a  follow-up appointment in 6 months.   Prescriptions: ANAMANTLE HC 3-0.5 %  CREA (LIDOCAINE-HYDROCORTISONE ACE) apply two times a day x 7 days  #1 x 0   Entered and Authorized by:   Nolon Rod. Kaige Whistler MD   Signed by:   Nolon Rod. Grettel Rames MD on 05/20/2008   Method used:   Print then Give to Patient   RxID:   [id number]  ]  Preventive Care Screening  Colonoscopy:    Date:  04/18/2008    Next Due:  04/2018    Results:  normal   Mammogram:    Date:  05/19/2008    Results:  normal   Pap Smear:    Date:  03/04/2008    Results:  normal

## 2010-11-06 NOTE — Progress Notes (Signed)
Summary:    FYI; WANTS TO HOLD OFF NEURO REFERRAL  Phone Note Call from Patient   Caller: Patient Summary of Call: pt called and left msg on VM; she wants to hold off on Neurology referral, has a cpx on 01/24/10 will discuss then. Kandice Hams  December 29, 2009 11:18 AM  Initial call taken by: Kandice Hams,  December 29, 2009 11:18 AM

## 2010-11-06 NOTE — Progress Notes (Signed)
Summary: Refill Request  Phone Note Refill Request Message from:  Pharmacy on [facility] [address] Valinda Hoar #: [phone]  Refills Requested: Medication #1:  CLONAZEPAM 0.5 MG  TABS take 1 tab p.o. t.i.d. p.r.n.   Dosage confirmed as above?Dosage Confirmed   Supply Requested: 3 months   Last Refilled: 10/30/2009 Initial call taken by: Harold Barban,  November 29, 2009 8:37 AM  Follow-up for Phone Call        was rx'd #90 with 1 refill on 10/02/09.  Last ov 08/04/09.... no pending appt.  Patient is due for a cpx - will mail letter to have pt contact office Shary Decamp  November 29, 2009 9:08 AM     Prescriptions: CLONAZEPAM 0.5 MG  TABS (CLONAZEPAM) take 1 tab p.o. t.i.d. p.r.n.  #90 x 0   Entered by:   Shary Decamp   Authorized by:   Nolon Rod. Negin Hegg MD   Signed by:   Shary Decamp on 11/29/2009   Method used:   Printed then faxed to ...       [facility]  [address] [phone]* (retail)       [address]       [place], [place]  [address]       Ph: [phone] or [phone]       Fax: [phone]   RxID:   [id number]

## 2010-11-06 NOTE — Progress Notes (Signed)
Summary: refill denied  Phone Note Refill Request Message from:  Fax from Pharmacy on December 11, 2009 8:42 AM  Refills Requested: Medication #1:  CLONAZEPAM 0.5 MG  TABS take 1 tab p.o. t.i.d. p.r.n. [facility] [address] fax [phone]   Method Requested: Fax to Local Pharmacy Next Appointment Scheduled: 01/24/2010 Initial call taken by: Barb Merino,  December 11, 2009 8:43 AM  Follow-up for Phone Call        DENIED Patient was rx'd #90 on 11/29/09 Linda Jefferson  December 11, 2009 9:34 AM

## 2010-11-06 NOTE — Assessment & Plan Note (Signed)
Summary: F/U PER PT/RH......   Vital Signs:  Patient profile:   65 year old female Height:      65 inches Weight:      134.4 pounds Pulse rate:   74 / minute Pulse rhythm:   regular BP sitting:   134 / 80  (left arm) Cuff size:   regular  Vitals Entered By: Shary Decamp (August 04, 2009 8:09 AM) CC: rov - fasting Is Patient Diabetic? No   History of Present Illness: cough-- better  h/o Atrial tachyc. note from cards [facility]) reviewed   Current Medications (verified): 1)  Lotrel 10-20 Mg Caps (Amlodipine Besy-Benazepril Hcl) .Marland Kitchen.. 1 By Mouth Once Daily 2)  Potassium Chloride 100 .... Take 1 Tab Once Daily As Directed 3)  Hydrochlorothiazide 12.5 Mg Caps (Hydrochlorothiazide) .Marland Kitchen.. 1 By Mouth Once Daily 4)  Evista 60 Mg  Tabs (Raloxifene Hcl) .... Once Daily 5)  Simvastatin 20 Mg Tabs (Simvastatin) .Marland Kitchen.. 1 By Mouth Qhs 6)  Clonazepam 0.5 Mg  Tabs (Clonazepam) .... Take 1 Tab P.o. T.i.d. P.r.n. 7)  Flonase 50 Mcg/act  Susp (Fluticasone Propionate) .... As Needed 8)  Loratadine 10 Mg  Tabs (Loratadine) .... As Needed 9)  Aspirin Ec 325 Mg Tbec (Aspirin) .Marland Kitchen.. 1 A Day 10)  One Source Mvi/d 11)  Propecia 1 Mg  Tabs (Finasteride) .... Once Daily 12)  Colon Health Probiotic Supp 13)  Anamantle Hc 3-0.5 % Crea (Lidocaine-Hydrocortisone Ace) .... Apply As Needed 14)  Guiatuss Ac 100-10 Mg/30ml Syrp (Guaifenesin-Codeine) .Marland Kitchen.. 1 or 2 Tsp Pp At Bedtime As Needed Severe Cough  Allergies (verified): 1)  ! * Nyquil 2)  ! * Flecainide  Comments:  Nurse/Medical Assistant: The patient's medications were reviewed with the patient and were updated in the Medication List. Shary Decamp (August 04, 2009 8:13 AM)  Past History:  Past Medical History: Reviewed history from 05/11/2009 and no changes required. Syncope 01-2009 ----> holter atrial tachycardia normal stress test 04-2009 G3 P3 Hypertension Carotid Dz per u/s 01-2009 Hyperlipidemia  Past Surgical History: Reviewed history  from 05/20/2008 and no changes required. Cholecystectomy Carpal tunnel release  Social History: Reviewed history from 01/03/2009 and no changes required. Married 3 children Never Smoked Alcohol use-yes  Review of Systems General:  Denies chills and fever. CV:  Denies chest pain or discomfort, near fainting, palpitations, and swelling of feet. Endo:  good medication compliance w/ chol meds .  Physical Exam  General:  alert, well-developed, and well-nourished.   Lungs:  normal respiratory effort, no intercostal retractions, no accessory muscle use, and normal breath sounds.   Heart:  normal rate, regular rhythm, and no murmur.   Extremities:  no pretibial edema bilaterally    Impression & Recommendations:  Problem # 1:  HYPERLIPIDEMIA (ICD-272.4)  Her updated medication list for this problem includes:    Simvastatin 20 Mg Tabs (Simvastatin) .Marland Kitchen... 1 by mouth qhs  Orders: Venipuncture (62703) TLB-ALT (SGPT) (84460-ALT) TLB-AST (SGOT) (84450-SGOT) TLB-Lipid Panel (80061-LIPID)  Problem # 2:  HYPERTENSION (ICD-401.9) no change, last K+ ok Her updated medication list for this problem includes:    Lotrel 10-20 Mg Caps (Amlodipine besy-benazepril hcl) .Marland Kitchen... 1 by mouth once daily    Hydrochlorothiazide 12.5 Mg Caps (Hydrochlorothiazide) .Marland Kitchen... 1 by mouth once daily  BP today: 134/80 Prior BP: 110/80 (07/19/2009)  Labs Reviewed: K+: 4.5 (05/22/2009) Creat: : 0.7 (05/22/2009)   Chol: 198 (01/03/2009)   HDL: 69.60 (01/03/2009)   LDL: 114 (01/03/2009)   TG: 74.0 (01/03/2009)  Problem #  3:  ATRIAL TACHYCARDIA (ICD-427.89) no further events s/p eval @ [facility], they rec observation/no change for now  Her updated medication list for this problem includes:    Aspirin Ec 325 Mg Tbec (Aspirin) .Marland Kitchen... 1 a day  Complete Medication List: 1)  Lotrel 10-20 Mg Caps (Amlodipine besy-benazepril hcl) .Marland Kitchen.. 1 by mouth once daily 2)  Potassium Chloride 100  .... Take 1 tab once daily as  directed 3)  Hydrochlorothiazide 12.5 Mg Caps (Hydrochlorothiazide) .Marland Kitchen.. 1 by mouth once daily 4)  Evista 60 Mg Tabs (Raloxifene hcl) .... Once daily 5)  Simvastatin 20 Mg Tabs (Simvastatin) .Marland Kitchen.. 1 by mouth qhs 6)  Clonazepam 0.5 Mg Tabs (Clonazepam) .... Take 1 tab p.o. t.i.d. p.r.n. 7)  Flonase 50 Mcg/act Susp (Fluticasone propionate) .... As needed 8)  Loratadine 10 Mg Tabs (Loratadine) .... As needed 9)  Aspirin Ec 325 Mg Tbec (Aspirin) .Marland Kitchen.. 1 a day 10)  One Source Mvi/d  11)  Propecia 1 Mg Tabs (Finasteride) .... Once daily 12)  Colon Health Probiotic Supp  13)  Anamantle Hc 3-0.5 % Crea (Lidocaine-hydrocortisone ace) .... Apply as needed 14)  Guiatuss Ac 100-10 Mg/5ml Syrp (Guaifenesin-codeine) .Marland Kitchen.. 1 or 2 tsp pp at bedtime as needed severe cough  Patient Instructions: 1)  Please schedule a follow-up appointment in 4 months (yearly)

## 2010-11-06 NOTE — Progress Notes (Signed)
Summary: would like xray/Dr Drue Novel see please  Call back at [phone] cell    Follow-up for Phone Call       Follow-up by: Kandice Hams,  January 01, 2010 8:29 AM  New Problems: ARM PAIN, LEFT (ICD-729.5)   Additional Follow-up for Phone Call Additional follow up Details #2::    Spoke with pt this am  who called nurse over weekend see below, who says  she would like to get an xray of her right hip and upper left arm, before the MRI and cpx on 01/24/10.  --would like to r/o arthritis, requesting the xray at [facility] [place] --pt is taking the prednisone which is helping the hip, not doing much for the arm, says vicodin makes her loopy --Pt is off tomorrow and if possible would like to go tomorrow. Follow-up by: Kandice Hams,  January 01, 2010 8:37 AM  Additional Follow-up for Phone Call Additional follow up Details #3:: Details for Additional Follow-up Action Taken: ok to do [employer] E. Paz MD  January 01, 2010 11:32 AM   xray order faxed to [facility] pt informed.Kandice Hams  January 01, 2010 12:18 PM  Additional Follow-up by: Kandice Hams,  January 01, 2010 12:20 PM  New Problems: ARM PAIN, LEFT (ICD-729.5) Triage Record Num: [id number] Operator: Geanie Berlin Patient Name: Linda Jefferson Call Date & Time: 12/31/2009 9:15:50AM Patient Phone: [phone] PCP: Marga Melnick Patient Gender: Female PCP Fax : Patient DOB: 12/06/1945 Practice Name: [facility] Reason for Call: 65 yo. Calling to request X-ray of R hip and L upper arm pain to r/o arthritis. Hx fall > 1 yo ago. Seen in office 12/29/09; diagnosed with bursitis. Currently on Prednisone X 8 days & Vicodan for pain. Declined traige. Advised to call office when office is open for routine appt for xray. OFFICE; REQUESTS XRAY BE ORDERED FOR TUESDAY. 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 Protocol(s) Used: PCP Calls, No Triage (Adult) Recommended Outcome per Protocol: Call Provider within 72 Hours Override  Outcome if Used in Protocol: Call Provider When Office is Open RN Reason for Override Outcome: Office Is Closed. Reason for Outcome: Caller requesting an appointment, triage offered and declined Care  Appended Document: would like xray/Dr Drue Novel see please    Clinical Lists Changes  Orders: Added new Test order of T-Humerus Left 2 Views (73060TC) - Signed

## 2010-11-06 NOTE — Progress Notes (Signed)
Summary: referral  Phone Note Call from Patient Call back at Home Phone [phone]   Summary of Call: patient has an appt for second opinion with dr Drue Stager [facility] - appt is [id number] @ 10:00 - phone # [phone] - she needs referral  Initial call taken by: Okey Regal [place],  March 29, 2010 10:43 AM  Follow-up for Phone Call        Does patient need a new referral or can I fax the old referral? Follow-up by: Harold Barban,  March 29, 2010 11:31 AM  Additional Follow-up for Phone Call Additional follow up Details #1::        ok to use old referral as long as it is not to old for new physician. pls check with facility to see if referral is ok to use...............Marland KitchenFelecia Deloach CMA  March 29, 2010 3:30 PM     Additional Follow-up for Phone Call Additional follow up Details #2::    Information faxed. Follow-up by: Harold Barban,  March 29, 2010 4:04 PM

## 2010-11-06 NOTE — Progress Notes (Signed)
Summary: requesting xray  Phone Note Call from Patient Call back at Home Phone [phone] Call back at Work Phone [phone]   Summary of Call: PATIENT LEFT MESSAGE ON VM: Patient now having rt heel pain & requesting xray Shary Decamp  January 10, 2010 2:00 PM   Follow-up for Phone Call        ok XR, if symptoms persist ----> RTC Jose E. Paz MD  January 10, 2010 4:47 PM   discussed with pt Shary Decamp  January 10, 2010 4:58 PM   New Problems: HEEL PAIN, RIGHT (ICD-729.5)   New Problems: HEEL PAIN, RIGHT (ICD-729.5)

## 2010-11-06 NOTE — Progress Notes (Signed)
Summary: how many calcium  Phone Note Call from Patient Call back at Home Phone [phone]   Caller: Patient Summary of Call: patient was told to take caltrate she wants to know how many to take she also takes centrum silver that has calcium Initial call taken by: Okey Regal [place],  May 18, 2010 1:46 PM  Follow-up for Phone Call        Spoke with pt and she is aware that the Caltrate with 600 mg is good. Army Fossa CMA  May 18, 2010 1:50 PM

## 2010-11-06 NOTE — Progress Notes (Signed)
Summary: xray-lmom  Phone Note Outgoing Call   Call placed by: Doristine Devoid,  January 02, 2010 1:56 PM Call placed to: Patient Summary of Call: advise patient: XR ok  Follow-up for Phone Call        left message on machine .....Marland KitchenMarland KitchenDoristine Devoid  January 02, 2010 1:56 PM   discussed with pt Shary Decamp  January 02, 2010 4:17 PM

## 2010-11-06 NOTE — Letter (Signed)
Summary: Primary Care Appointment Letter  [facility] [place]  [address] [place], [place] [address]   Phone: [phone]  Fax: [phone]    11/29/2009 MRN: [id number]  Linda Jefferson [address] [place], [place]  [address]  Dear [name],   Your Primary Care Physician [place] E. Paz MD has indicated that:    ___x____it is time to schedule an appointment.  Please call our office @ [phone] ext [phone] to schedule a complete physical with Dr. Drue Novel.      Thank you,    [place] Primary Care Scheduler

## 2010-11-06 NOTE — Progress Notes (Signed)
Summary: refill  Phone Note Refill Request Message from:  Fax from Pharmacy on January 19, 2010 2:15 PM  hydrocodon-acetaminophen 5-500/[facility] [address] fax [phone]   Method Requested: Fax to Local Pharmacy Next Appointment Scheduled: 03/20/2010 Initial call taken by: Barb Merino,  January 19, 2010 2:17 PM  Follow-up for Phone Call        #40 with no refills on 12/29/09 [facility]  January 19, 2010 3:12 PM     Prescriptions: VICODIN 5-500 MG TABS (HYDROCODONE-ACETAMINOPHEN) one or two by mouth every 8 hours as needed for pain  #40 x 0   Entered and Authorized by:   Shary Decamp   Signed by:   Shary Decamp on 01/19/2010   Method used:   Printed then faxed to ...       [facility]  [address] [phone]* (retail)       [address]       [place], [place]  [address]       Ph: [phone] or [phone]       Fax: [phone]   RxID:   [id number]

## 2010-11-06 NOTE — Progress Notes (Signed)
Summary: paz--refill  Phone Note Call from Patient Call back at Home Phone [phone]   Caller: Patient Reason for Call: Refill Medication Summary of Call: refill on evista, hydrochlorothiazide --pharm[facility] [address] -[phone] Initial call taken by: Freddy Jaksch,  June 22, 2008 10:55 AM      Prescriptions: HYDROCHLOROTHIAZIDE 25 MG  TABS (HYDROCHLOROTHIAZIDE) 2 once daily  #60 x 5   Entered by:   Kandice Hams   Authorized by:   Nolon Rod. Paz MD   Signed by:   Kandice Hams on 06/22/2008   Method used:   Faxed to ...       [facility]  [address] [phone]* (retail)       [address]       [place], [place]  [address]       Ph: [phone] or [phone]       Fax: [phone]   RxID:   [id number] EVISTA 60 MG  TABS (RALOXIFENE HCL) once daily  #30 x 5   Entered by:   Kandice Hams   Authorized by:   Nolon Rod. Paz MD   Signed by:   Kandice Hams on 06/22/2008   Method used:   Faxed to ...       [facility]  [address] [phone]* (retail)       [address]       [place], [place]  [address]       Ph: [phone] or [phone]       Fax: [phone]   RxID:   [id number]

## 2010-11-06 NOTE — Letter (Signed)
Summary: back pain evaluation----[facility]  [facility]   Imported By: Lanelle Bal 04/16/2010 12:32:15  _____________________________________________________________________  External Attachment:    Type:   Image     Comment:   External Document

## 2010-11-06 NOTE — Letter (Signed)
Summary: Primary Care Appointment Letter  [name] [place]  [address] [place], [place] [address]   Phone: [phone]  Fax: [phone]    01/03/2010 MRN: [id number]  [name] [address] [place], [place]  [address]  Dear [name],   Your Primary Care Physician [place] E. Paz MD has indicated that:    _______it is time to schedule an appointment.    _______you missed your appointment on______ and need to call and          reschedule.    _______you need to have lab work done.    _______you need to schedule an appointment discuss lab or test results.    ____x___you need to call to reschedule your appointment that is                       scheduled on __APRIL 20,2011 @ 12:40PM_______.     Please call our office as soon as possible. Our phone number is [phone]________. Please press option 1. Our office is open 8a-12noon and 1p-5p, Monday through Friday.     Thank you,    Linda Jefferson Primary Care Scheduler

## 2010-11-06 NOTE — Progress Notes (Signed)
Summary: REFILL  Phone Note Refill Request Message from:  Fax from Pharmacy on January 01, 2010 4:15 PM  Refills Requested: Medication #1:  CLONAZEPAM 0.5 MG  TABS take 1 tab p.o. t.i.d. p.r.n. [facility] [address] Val[facility] Hoar [phone]   Method Requested: Fax to Local Pharmacy Next Appointment Scheduled: NO APPT Initial call taken by: Barb Merino,  January 01, 2010 4:16 PM  Follow-up for Phone Call        #90 on 11/29/09 Shary Decamp  January 01, 2010 4:56 PM ok 90 and 3 RF Jose E. Paz MD  January 02, 2010 10:22 AM     Prescriptions: CLONAZEPAM 0.5 MG  TABS (CLONAZEPAM) take 1 tab p.o. t.i.d. p.r.n.  #90 x 3   Entered by:   Kandice Hams   Authorized by:   Nolon Rod. Paz MD   Signed by:   Kandice Hams on 01/02/2010   Method used:   Printed then faxed to ...       [facility]  [address] [phone]* (retail)       [address]       [place], [place]  [address]       Ph: [phone] or [phone]       Fax: [phone]   RxID:   [id number]

## 2010-11-06 NOTE — Progress Notes (Signed)
Summary: WANTS RX FOR HEMORRHOIDS GOING OUT OF TOWN  Phone Note Call from Patient Call back at Home Phone [phone]   Caller: Patient Call For: [place] E. Paz MD Reason for Call: Refill Medication Summary of Call: PT CALLING SAYS HEMORRHOIDS ARE VERY SWOLLEN, NO BLEEDING, PT REQUESTING TUBE OF ANAMANTLE HC, STRESSES SHE ONLY WANTS THE TUBE, NO APPLICATORS, AS TOO MUCH MED IS WASTED USING APPLICATOR.  PT WAS LAST GIVEN THIS ABOUT 1 YEAR AGO BY DR. PAZ.  [facility]  PT SAYS SHE NEEDS RX TODAY BECAUSE SHE IS GOING OUT OF TOWN. LAST REFILL #1 X 0 ON 05/20/08, LAST OV 03/09/09, RX REMOVED 02/07/09 [facility] REGIMEN COMPLETED Initial call taken by: Magdalen Spatz [facility],  April 17, 2009 1:56 PM  Follow-up for Phone Call        ok call 1 tube, no RF Call or UC if hemorrhoids worsen or are  no better  Jose E. Paz MD  April 17, 2009 5:48 PM   Additional Follow-up for Phone Call Additional follow up Details #1::        rx faxed left msg for pt .Kandice Hams  April 18, 2009 8:30 AM  Additional Follow-up by: Kandice Hams,  April 18, 2009 8:30 AM    New/Updated Medications: ANAMANTLE HC 3-0.5 % CREA (LIDOCAINE-HYDROCORTISONE ACE) apply as needed Prescriptions: ANAMANTLE HC 3-0.5 % CREA (LIDOCAINE-HYDROCORTISONE ACE) apply as needed  #1 tube x 0   Entered by:   Kandice Hams   Authorized by:   Nolon Rod. Paz MD   Signed by:   Kandice Hams on 04/18/2009   Method used:   Faxed to ...       [facility]  [address] [phone]* (retail)       [address]       [place], [place]  [address]       Ph: [phone] or [phone]       Fax: [phone]   RxID:   [id number]

## 2010-11-08 NOTE — Progress Notes (Signed)
Summary: appt--lmom   1/12--made appt  Phone Note Outgoing Call   Call placed by: Army Fossa CMA,  October 18, 2010 1:04 PM Summary of Call: please call pt and have pt schedule a follow up visit w/ Dr.Paz.   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

## 2010-11-08 NOTE — Assessment & Plan Note (Signed)
Summary: per phone note=followup appt///sph   Vital Signs:  Patient profile:   65 year old female Weight:      134.38 pounds Pulse rate:   75 / minute Pulse rhythm:   regular BP sitting:   126 / 80  (left arm) Cuff size:   large  Vitals Entered By: Army Fossa CMA (October 22, 2010 9:33 AM) CC: follow up visit- fasting  Comments [facility]   History of Present Illness: ROV feeling well few days ago, had  heartburn which isn't usual for her;  along with the heartburn had a cramp on  the distal left chest. Would like a  EKG, she has a history of atrial tachycardia  osteopenia diagnosis per DEXA last year, not taking calcium, takes vitamin D 3 times a week she has taken Evista for years, her previous doctor prescribed it due to her family history of breast cancer  ROS No chest pain, no palpitations No shortness of breath No lower extremity edema   Current Medications (verified): 1)  Lotrel 5-10 Mg Caps (Amlodipine Besy-Benazepril Hcl) .Marland Kitchen.. 1 By Mouth Once Daily 2)  Klor-Con M20 20 Meq Cr-Tabs (Potassium Chloride Crys Cr) .Marland Kitchen.. 1 By Mouth Daily. 3)  Hydrochlorothiazide 12.5 Mg Caps (Hydrochlorothiazide) .Marland Kitchen.. 1 By Mouth Once Daily 4)  Evista 60 Mg  Tabs (Raloxifene Hcl) .... Once Daily 5)  Simvastatin 20 Mg Tabs (Simvastatin) .Marland Kitchen.. 1 By Mouth At Bedtime. 6)  Clonazepam 0.5 Mg  Tabs (Clonazepam) .... Take 1 Tab P.o. T.i.d. P.r.n. 7)  Loratadine 10 Mg  Tabs (Loratadine) .... As Needed 8)  Aspirin Ec 325 Mg Tbec (Aspirin) .Marland Kitchen.. 1 A Day 9)  One Source Mvi/d 10)  Propecia 1 Mg  Tabs (Finasteride) .... Once Daily 11)  Colon Health Probiotic Supp 12)  Anamantle Hc 3-0.5 % Crea (Lidocaine-Hydrocortisone Ace) .... Apply As Needed 13)  Caltrate 600+d 600-400 Mg-Unit Tabs (Calcium Carbonate-Vitamin D) .... Daily  Allergies (verified): 1)  ! * Nyquil 2)  ! * Flecainide  Past History:  Past Medical History: Syncope 01-2009 ----> holter atrial tachycardia normal stress test  04-2009 G3 P3 Hypertension Carotid Dz per u/s 01-2009-- repeated u/s 7-11 "mild dz, no stenosis" Hyperlipidemia LBP 3-11, better after a local injection  Past Surgical History: Reviewed history from 05/20/2008 and no changes required. Cholecystectomy Carpal tunnel release  Physical Exam  General:  alert, well-developed, and well-nourished.   Lungs:  normal respiratory effort, no intercostal retractions, no accessory muscle use, and normal breath sounds.   Heart:  normal rate, regular rhythm, and no murmur.   Extremities:  no pretibial edema bilaterally  Psych:  not anxious appearing and not depressed appearing.     Impression & Recommendations:  Problem # 1:  ATRIAL TACHYCARDIA (ICD-427.89) history of atrial tachycardia, patient concerned about recent heartburn and a "cramp like" feeling in the left chest one time.  EKG today-- NSR advised to call any chest pain, palpitations or more "heartburn"  Her updated medication list for this problem includes:    Aspirin Ec 325 Mg Tbec (Aspirin) .Marland Kitchen... 1 a day  Orders: EKG w/ Interpretation (93000)  Problem # 2:  OSTEOPENIA (ICD-733.90) dx per Dexa 7-11  has been on Evista for years for her  family history of breast cancer has not been taking calcium regularly plan: Exercise, calcium and vitamin D daily Her updated medication list for this problem includes:    Evista 60 Mg Tabs (Raloxifene hcl) ..... Once daily    Caltrate 600+d 600-400 Mg-unit  Tabs (Calcium carbonate-vitamin d) .Marland Kitchen... Daily  Problem # 3:  HYPERLIPIDEMIA (ICD-272.4)  Her updated medication list for this problem includes:    Simvastatin 20 Mg Tabs (Simvastatin) .Marland Kitchen... 1 by mouth at bedtime.  Labs Reviewed: SGOT: 39 (04/13/2010)   SGPT: 28 (04/13/2010)   HDL:78 (04/13/2010), 58.90 (08/04/2009)  LDL:86 (04/13/2010), 75 (08/04/2009)  Chol:181 (04/13/2010), 148 (08/04/2009)  Trig:87 (04/13/2010), 70.0 (08/04/2009)  Problem # 4:  HYPERTENSION (ICD-401.9) at goal    Her updated medication list for this problem includes:    Lotrel 5-10 Mg Caps (Amlodipine besy-benazepril hcl) .Marland Kitchen... 1 by mouth once daily    Hydrochlorothiazide 12.5 Mg Caps (Hydrochlorothiazide) .Marland Kitchen... 1 by mouth once daily  BP today: 126/80 Prior BP: 148/98 (04/13/2010)  Labs Reviewed: K+: 5.0 (04/13/2010) Creat: : 0.73 (04/13/2010)   Chol: 181 (04/13/2010)   HDL: 78 (04/13/2010)   LDL: 86 (04/13/2010)   TG: 87 (04/13/2010)  Orders: Venipuncture (16109) TLB-BMP (Basic Metabolic Panel-BMET) (80048-METABOL)  Complete Medication List: 1)  Lotrel 5-10 Mg Caps (Amlodipine besy-benazepril hcl) .Marland Kitchen.. 1 by mouth once daily 2)  Klor-con M20 20 Meq Cr-tabs (Potassium chloride crys cr) .Marland Kitchen.. 1 by mouth daily. 3)  Hydrochlorothiazide 12.5 Mg Caps (Hydrochlorothiazide) .Marland Kitchen.. 1 by mouth once daily 4)  Evista 60 Mg Tabs (Raloxifene hcl) .... Once daily 5)  Simvastatin 20 Mg Tabs (Simvastatin) .Marland Kitchen.. 1 by mouth at bedtime. 6)  Clonazepam 0.5 Mg Tabs (Clonazepam) .... Take 1 tab p.o. t.i.d. p.r.n. 7)  Loratadine 10 Mg Tabs (Loratadine) .... As needed 8)  Aspirin Ec 325 Mg Tbec (Aspirin) .Marland Kitchen.. 1 a day 9)  One Source Mvi/d  10)  Propecia 1 Mg Tabs (Finasteride) .... Once daily 11)  Colon Health Probiotic Supp  12)  Anamantle Hc 3-0.5 % Crea (Lidocaine-hydrocortisone ace) .... Apply as needed 13)  Caltrate 600+d 600-400 Mg-unit Tabs (Calcium carbonate-vitamin d) .... Daily  Patient Instructions: 1)  Please schedule a follow-up appointment in 6 months . fasting, physical exam Prescriptions: CLONAZEPAM 0.5 MG  TABS (CLONAZEPAM) take 1 tab p.o. t.i.d. p.r.n.  #90 x 6   Entered and Authorized by:   Nolon Rod. Paz MD   Signed by:   Nolon Rod. Paz MD on 10/22/2010   Method used:   Print then Give to Patient   RxID:   [id number] SIMVASTATIN 20 MG TABS (SIMVASTATIN) 1 by mouth at bedtime.  #30 x 12   Entered and Authorized by:   Nolon Rod. Paz MD   Signed by:   Nolon Rod. Paz MD on 10/22/2010   Method used:    Electronically to        [facility] [phone]* (retail)       [address]       [place], [place]  [address]       Ph: [phone] or [phone]       Fax: [phone]   RxID:   [id number] HYDROCHLOROTHIAZIDE 12.5 MG CAPS (HYDROCHLOROTHIAZIDE) 1 by mouth once daily  #60 x 6   Entered and Authorized by:   Nolon Rod. Paz MD   Signed by:   Nolon Rod. Paz MD on 10/22/2010   Method used:   [facility]        [facility] [phone]* (retail)       [address]       [place], [place]  [address]  Ph: [phone] or [phone]       Fax: [phone]   RxID:   [id number] KLOR-CON M20 20 MEQ CR-TABS (POTASSIUM CHLORIDE CRYS CR) 1 by mouth daily.  #30 Tablet x 12   Entered and Authorized by:   Nolon Rod. Paz MD   Signed by:   Nolon Rod. Paz MD on 10/22/2010   Method used:   [facility]ron[facility]        [facility] [address] [phone]* (retail)       [address]       [facility]       [place], [place]  [address]       Ph: [phone] or [phone]       Fax: [phone]   RxID:   [id number]    Orders Added: 1)  EKG w/ Interpretation [93000] 2)  Venipuncture [55732] 3)  TLB-BMP (Basic Metabolic Panel-BMET) [80048-METABOL] 4)  Est. Patient Level III [20254]

## 2011-01-13 ENCOUNTER — Other Ambulatory Visit: Payer: Self-pay | Admitting: Internal Medicine

## 2011-01-16 LAB — CARDIAC PANEL(CRET KIN+CKTOT+MB+TROPI)
CK, MB: 2 ng/mL (ref 0.3–4.0)
CK, MB: 2.2 ng/mL (ref 0.3–4.0)
CK, MB: 2.7 ng/mL (ref 0.3–4.0)
Relative Index: 1.7 (ref 0.0–2.5)
Relative Index: 1.7 (ref 0.0–2.5)
Relative Index: 1.9 (ref 0.0–2.5)
Total CK: 118 U/L (ref 7–177)
Total CK: 126 U/L (ref 7–177)
Total CK: 144 U/L (ref 7–177)
Troponin I: 0.01 ng/mL (ref 0.00–0.06)
Troponin I: 0.01 ng/mL (ref 0.00–0.06)
Troponin I: <0.01 ng/mL (ref 0.00–0.06)

## 2011-01-16 LAB — CBC
HCT: 39.1 % (ref 36.0–46.0)
HCT: 30.7 % — ABNORMAL LOW (ref 36.0–46.0)
Hemoglobin: 13.2 g/dL (ref 12.0–15.0)
Hemoglobin: 10.6 g/dL — ABNORMAL LOW (ref 12.0–15.0)
MCHC: 33.7 g/dL (ref 30.0–36.0)
MCHC: 34.4 g/dL (ref 30.0–36.0)
MCV: 90.4 fL (ref 78.0–100.0)
MCV: 90 fL (ref 78.0–100.0)
Platelets: 389 10*3/uL (ref 150–400)
Platelets: 293 10*3/uL (ref 150–400)
RBC: 4.33 MIL/uL (ref 3.87–5.11)
RBC: 3.41 MIL/uL — ABNORMAL LOW (ref 3.87–5.11)
RDW: 12.5 % (ref 11.5–15.5)
RDW: 13.3 % (ref 11.5–15.5)
WBC: 6.1 10*3/uL (ref 4.0–10.5)
WBC: 5.6 10*3/uL (ref 4.0–10.5)

## 2011-01-16 LAB — BASIC METABOLIC PANEL
BUN: 19 mg/dL (ref 6–23)
BUN: 13 mg/dL (ref 6–23)
CO2: 30 mEq/L (ref 19–32)
CO2: 27 mEq/L (ref 19–32)
Calcium: 10.2 mg/dL (ref 8.4–10.5)
Calcium: 8.7 mg/dL (ref 8.4–10.5)
Chloride: 96 mEq/L (ref 96–112)
Chloride: 100 mEq/L (ref 96–112)
Creatinine, Ser: 0.7 mg/dL (ref 0.4–1.2)
Creatinine, Ser: 0.67 mg/dL (ref 0.4–1.2)
GFR calc Af Amer: >60 mL/min (ref >60)
GFR calc Af Amer: >60 mL/min (ref >60)
GFR calc non Af Amer: >60 mL/min (ref >60)
GFR calc non Af Amer: >60 mL/min (ref >60)
Glucose, Bld: 106 mg/dL — ABNORMAL HIGH (ref 70–99)
Glucose, Bld: 107 mg/dL — ABNORMAL HIGH (ref 70–99)
Potassium: 3.2 mEq/L — ABNORMAL LOW (ref 3.5–5.1)
Potassium: 3.5 mEq/L (ref 3.5–5.1)
Sodium: 137 mEq/L (ref 135–145)
Sodium: 133 mEq/L — ABNORMAL LOW (ref 135–145)

## 2011-01-16 LAB — T4, FREE: Free T4: 0.95 ng/dL (ref 0.80–1.80)

## 2011-01-16 LAB — TSH: TSH: 2.38 u[IU]/mL (ref 0.350–4.500)

## 2011-01-16 LAB — T3, FREE: T3, Free: 2.9 pg/mL (ref 2.3–4.2)

## 2011-02-19 NOTE — Discharge Summary (Signed)
[name], [name]:  [id number]   MEDICAL RECORD NO.:  [id number]          PATIENT TYPE:  INP   LOCATION:  1412                         FACILITY:  [facility]   PHYSICIAN:  Rosalyn Gess. Norins, MD  DATE OF BIRTH:  January 22, 1946   DATE OF ADMISSION:  01/31/2009  DATE OF DISCHARGE:  02/02/2009                               DISCHARGE SUMMARY   ADMITTING DIAGNOSIS:  Syncope.   DISCHARGE DIAGNOSIS:  Syncope with no recurrence, no abnormalities.   CONSULTANTS:  None.   PROCEDURES:  1. Continuous telemetry monitoring which was negative.  2. CT of the head without contrast April 27 which showed no acute      intracranial abnormality.  Mild cerebral atrophy.  Extensive      periventricular and subcortical white matter decreased attenuation      may be due to small vessel ischemic changes.  Extracranial soft      tissue swelling of probable skin laceration noted left frontal      region.  3.  Diagnostic rib films April 27:  Mild displaced      fracture of the left 6th and 7th rib.  3. Hand x-ray April 27 which showed tiny avulsion fracture at the base      of the distal phalanx which could be acute or chronic.  4. Elbow x-ray April 27 which showed soft tissue swelling posterior      elbow compatible with hemorrhagic bursitis, no fracture noted.   HISTORY OF PRESENT ILLNESS:  [name] is a 65 year old woman followed  for hypertension who reported that 8 a.m. on the morning of admission  while moving around chairs and desks at work she felt herself become  very lightheaded.  She went to rest against the wall, and then found  herself awakening on the floor surrounded by coworkers and first  responders.  She had no recollection of any chest pain or palpitations  prior to her syncopal episode.  She did note that she had left-sided  chest pain, and was bleeding from a small laceration at the left  forehead.  She did have significant discomfort with deep inspiration.  Because  of her symptoms she presented to [facility]  [facility] where she was found to have rib fractures, and was referred  for admission for syncope workup.   Please see the EMR generated H and P for past medical history, family  history, social history, and admission physical exam.   HOSPITAL COURSE:  1. Syncope.  The patient had no recurrent syncopal episodes while in      hospital.  She was on 24-hour telemetry monitoring which revealed      no arrhythmias.  2-D echo was performed, and it was normal with an      ejection fraction of 65-70%, no wall motion abnormalities.  Carotid      Dopplers were performed which showed that patient had a 60-80%      occlusion on the left, a 40 to 60% ICA stenosis on the right.  The      patient's neurologic exams were serial  and negative.  With the      patient having no evidence of arrhythmia with carotid Doppler and 2-      D echo being normal, with her having no recurrent symptoms, she is      felt to be stable and ready for discharge home.  She will be seeing      her primary care physician, Dr. Drue Novel, and they will decide together      about further evaluation and possible EP evaluation.  2. Rib fractures.  The patient did have 2 rib fractures.  She has      ongoing chest wall pain with deep inspiration.  She has been using      incentive spirometry on a regular basis.  She had no respiratory      distress with no increased work of breathing.  Plan:  The patient      to continue to use incentive spirometry every hour while awake.  3. Orthopedics.  The patient has what was considered to be a tiny      avulsion fracture of the thumb.  She has good range of motion.      There is no significant swelling.  No real indication for      orthopedic consult at this time.  4. Hypertension.  The patient reports that her blood pressure at home      had been running low in the 100-110/70 range.  During her      hospitalization while getting  benazepril, amlodipine, and      hydrochlorothiazide, her blood pressure was well-controlled and      perhaps on the low end.  The blood pressure at discharge 114/64.      The 28th her blood pressure was 108/67.  Plan:  The patient is      instructed to hold Lotrel until seen in follow-up.  She will      continue with hydrochlorothiazide.   DISCHARGE PHYSICAL EXAMINATION:  Temperature was 98.6, blood pressure  114/64, heart rate 88, respirations 20, O2 sats 97% on room air.  Telemetry revealed normal sinus rhythm.  GENERAL APPEARANCE:  This is a well-nourished woman.  She is in no acute  distress.  HEENT:  The patient has a laceration at the left forehead with sutures  in place which appear stable.  There is no battle sign or raccoon's eyes  or other signs of trauma.  NECK:  Supple.  CHEST:  Patient is moving air well, although taking deep breaths causes  discomfort with no rales, wheezes or rhonchi.  Chest wall was not  palpated.  CARDIOVASCULAR:  2+ radial pulse.  Her precordium was quiet.  She had a regular rate and rhythm.  ABDOMEN:  Soft.  No guarding or rebound.  No tenderness.   FINAL LABORATORIES:  T3 was normal at 2.9, free T4 was normal at 0.95.  TSH was normal at 2.38.  The patient had cardiac panel x3 with a CK of  144, 126, 118.  Troponin I was 0.01, 0.01, and less than 0.01.  Basic  metabolic panel from the 28th was unremarkable with a sodium of 133,  potassium 3.5, chloride 100, CO2 27, glucose was 107, BUN 13, creatinine  0.67.  CBC from the 28th with a white count of 5600, hemoglobin slightly  low at 10.6 g, platelet count of 293,000.   DISCHARGE MEDICATIONS:  The patient will resume:   1. Evista 60 mg daily.  2. Hydrochlorothiazide 25 mg daily.  3. Clonazepam 0.5 mg b.i.d.  4. Loratadine 10 mg daily.  5. Flonase spray once each nostril daily as needed.  6. Propecia 1 mg daily.  7. Multivitamins daily.  8. She will hold Lotrel until seen in follow-up.  9.  Continue with Nexium 40 mg daily.  10.For pain management will continue the patient on hydrocodone/APAP      5/325 one every 6 hours as needed.   DISPOSITION:  The patient is discharged to home.  She will be seeing Dr.  Willow Ora for follow-up on Tuesday, May 4, at 3:20 p.m.  She will not  return to work until cleared by Dr. Drue Novel.   The patient will need to have sutures removed from her forehead wound at  the return visit to Dr. Drue Novel.  The patient will use incentive spirometry  at home.   PATIENT'S CONDITION AT TIME OF DISCHARGE DICTATION:  Stable and  improved.      Rosalyn Gess Norins, MD  Electronically Signed     MEN/MEDQ  D:  02/02/2009  T:  02/02/2009  Job:  [id number]   cc:   Willow Ora, M.D.  [facility], [place], [place]

## 2011-03-14 ENCOUNTER — Other Ambulatory Visit: Payer: Self-pay | Admitting: Internal Medicine

## 2011-04-12 ENCOUNTER — Encounter: Payer: Self-pay | Admitting: Internal Medicine

## 2011-04-15 ENCOUNTER — Telehealth: Payer: Self-pay | Admitting: Internal Medicine

## 2011-04-15 ENCOUNTER — Encounter: Payer: Self-pay | Admitting: Internal Medicine

## 2011-04-15 ENCOUNTER — Ambulatory Visit (INDEPENDENT_AMBULATORY_CARE_PROVIDER_SITE_OTHER): Payer: BC Managed Care – PPO | Admitting: Internal Medicine

## 2011-04-15 VITALS — BP 122/82 | HR 73 | Wt 133.2 lb

## 2011-04-15 DIAGNOSIS — M79673 Pain in unspecified foot: Secondary | ICD-10-CM | POA: Insufficient documentation

## 2011-04-15 DIAGNOSIS — M79644 Pain in right finger(s): Secondary | ICD-10-CM

## 2011-04-15 DIAGNOSIS — M79609 Pain in unspecified limb: Secondary | ICD-10-CM

## 2011-04-15 NOTE — Assessment & Plan Note (Signed)
morton's neuroma versus a callus. We'll like to see a specialist, will refer to orthopedic surgery

## 2011-04-15 NOTE — Progress Notes (Signed)
  Subjective:    Patient ID: Linda Jefferson, female    DOB: November 06, 1945, 65 y.o.   MRN: [id number]  HPI Right foot pain on and off for a while, thinks he has a callus there. Would like a referral to a specialist.  Past Medical History  Diagnosis Date  . 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 Normal cardiac stress test 04/2009  . Hypertension   . Carotid artery disease     per u/s 01/2009- repeated u/s 7/11 "mild dz, no stenosis"  . Hyperlipidemia   . LBP (low back pain) 3/11    after a local injection    Past Surgical History  Procedure Date  . Cholecystectomy   . Carpal tunnel release      Review of Systems Good medication compliance with cholesterol medication and BP meds. Ambulatory blood pressure was within normal. No lower extremity edema.     Objective:   Physical Exam  Constitutional: She appears well-developed and well-nourished. No distress.  Cardiovascular: Normal rate, regular rhythm and normal heart sounds.   No murmur heard. Pulmonary/Chest: Effort normal and breath sounds normal. No respiratory distress. She has no wheezes. She has no rales.  Musculoskeletal:       Right plantar area proximal and from the third toe he has a bony prominence as well as a 4x3 mm callus. The area slightly tender but not red-warm. He has a symmetric bony prominence of the left without the callus.          Assessment & Plan:

## 2011-04-15 NOTE — Telephone Encounter (Signed)
Dr.Paz please advise  

## 2011-04-15 NOTE — Patient Instructions (Signed)
Please schedule a fasting physical exam at your convenience

## 2011-04-16 ENCOUNTER — Ambulatory Visit (INDEPENDENT_AMBULATORY_CARE_PROVIDER_SITE_OTHER)
Admission: RE | Admit: 2011-04-16 | Discharge: 2011-04-16 | Disposition: A | Discharge status: Home / Self Care | Payer: BC Managed Care – PPO | Source: Ambulatory Visit | Attending: Internal Medicine | Admitting: Internal Medicine

## 2011-04-16 ENCOUNTER — Telehealth: Payer: Self-pay | Admitting: Internal Medicine

## 2011-04-16 DIAGNOSIS — M79646 Pain in unspecified finger(s): Secondary | ICD-10-CM

## 2011-04-16 DIAGNOSIS — M79644 Pain in right finger(s): Secondary | ICD-10-CM

## 2011-04-16 DIAGNOSIS — M79609 Pain in unspecified limb: Secondary | ICD-10-CM

## 2011-04-16 NOTE — Telephone Encounter (Signed)
Left detailed message for pt that xray was ordered and she could walk in at [facility] in between the times of 830-430. Asked to call back w/ any concerns or questions.

## 2011-04-16 NOTE — Telephone Encounter (Signed)
Dr. Drue Novel,  In reference to Orthopaedic referral, the DX is Foot Pain, but in the comments you have added Finger Pain. Is she going to Ortho for foot & finger, if so please add dx to referral for the finger.  Please advise.

## 2011-04-16 NOTE — Telephone Encounter (Signed)
Arrange for a Right hand x-ray, dx  finger pain

## 2011-04-17 ENCOUNTER — Telehealth: Payer: Self-pay | Admitting: Internal Medicine

## 2011-04-17 NOTE — Telephone Encounter (Signed)
Yes, please add.

## 2011-04-17 NOTE — Telephone Encounter (Signed)
Ok will cancel referral

## 2011-04-17 NOTE — Progress Notes (Signed)
Addended by: Doristine Devoid on: 04/17/2011 11:41 AM   Modules accepted: Orders

## 2011-04-18 ENCOUNTER — Telehealth: Payer: Self-pay | Admitting: *Deleted

## 2011-04-18 ENCOUNTER — Telehealth: Payer: Self-pay | Admitting: Internal Medicine

## 2011-04-18 NOTE — Telephone Encounter (Signed)
Message copied by Leanne Lovely on Thu Apr 18, 2011  2:51 PM ------      Message from: Willow Ora E      Created: Wed Apr 17, 2011  6:03 PM       advise patient, x-rays show osteoarthritis (as expected). Plan is the same

## 2011-04-18 NOTE — Telephone Encounter (Signed)
noted 

## 2011-04-18 NOTE — Telephone Encounter (Signed)
Message left for patient to return my call.  

## 2011-04-18 NOTE — Telephone Encounter (Signed)
After referring the patient to [facility] (pt already established there), I rec'd fax from them stating they called the patient and she refused to schedule with them.  I have called patient who states she is holding off on being seen for the Foot Pain, and that she didn't schedule appt for the finger pain because she was never notified of the xray results.  She wants to speak with her spouse prior to deciding who and if she wants to be seen for her fingers.

## 2011-04-18 NOTE — Telephone Encounter (Signed)
Pt is aware.  

## 2011-05-06 ENCOUNTER — Other Ambulatory Visit: Payer: Self-pay | Admitting: Internal Medicine

## 2011-05-06 MED ORDER — CLONAZEPAM 0.5 MG PO TABS: 0.5000 mg | ORAL_TABLET | Freq: Three times a day (TID) | ORAL | Status: DC | PRN

## 2011-05-06 NOTE — Telephone Encounter (Signed)
OK x 1 

## 2011-05-06 NOTE — Telephone Encounter (Signed)
Last ov- 04/15/11 Last refill 10/12/10 #90, 6 rfs.

## 2011-05-13 ENCOUNTER — Telehealth: Payer: Self-pay | Admitting: *Deleted

## 2011-05-13 NOTE — Telephone Encounter (Signed)
Pt is wanting to have fasting labs done. She could just come in for the labs while still on summer break. Then she could make an appointment for after school one day. Otherwise, she is going to wait until the end of Oct or sometime in Nov/Dec.

## 2011-05-15 ENCOUNTER — Encounter: Payer: BC Managed Care – PPO | Admitting: Internal Medicine

## 2011-05-21 NOTE — Telephone Encounter (Signed)
She is overdue for a physical exam, please  Arrange a physical ; if there is no slots available in the morning she can come anytime during the day and have labs done at a later time

## 2011-05-22 ENCOUNTER — Ambulatory Visit (HOSPITAL_BASED_OUTPATIENT_CLINIC_OR_DEPARTMENT_OTHER)
Admission: RE | Admit: 2011-05-22 | Discharge: 2011-05-22 | Disposition: A | Discharge status: Home / Self Care | Payer: BC Managed Care – PPO | Source: Ambulatory Visit | Attending: Orthopedic Surgery | Admitting: Orthopedic Surgery

## 2011-05-22 ENCOUNTER — Other Ambulatory Visit (HOSPITAL_BASED_OUTPATIENT_CLINIC_OR_DEPARTMENT_OTHER): Payer: Self-pay | Admitting: Orthopedic Surgery

## 2011-05-22 DIAGNOSIS — M545 Low back pain, unspecified: Secondary | ICD-10-CM

## 2011-05-22 DIAGNOSIS — M25559 Pain in unspecified hip: Secondary | ICD-10-CM | POA: Insufficient documentation

## 2011-05-22 DIAGNOSIS — M5137 Other intervertebral disc degeneration, lumbosacral region: Secondary | ICD-10-CM

## 2011-05-22 DIAGNOSIS — R29898 Other symptoms and signs involving the musculoskeletal system: Secondary | ICD-10-CM

## 2011-05-22 DIAGNOSIS — M5126 Other intervertebral disc displacement, lumbar region: Secondary | ICD-10-CM

## 2011-05-22 NOTE — Telephone Encounter (Signed)
Entered last msg in error--patient already has appt for Oct 31 cpx

## 2011-05-22 NOTE — Telephone Encounter (Signed)
lmom for patient to call and schedule appt---since she is overdue, Alfonse Flavors wants to see her , then order labs

## 2011-06-27 ENCOUNTER — Other Ambulatory Visit: Payer: Self-pay | Admitting: Internal Medicine

## 2011-06-28 MED ORDER — CLONAZEPAM 0.5 MG PO TABS: 0.5000 mg | ORAL_TABLET | Freq: Three times a day (TID) | ORAL | Status: DC | PRN

## 2011-06-28 NOTE — Telephone Encounter (Signed)
Ok 90, no RF 

## 2011-07-01 NOTE — Telephone Encounter (Signed)
Forwarding to [place] not sure why refill came to me

## 2011-07-05 LAB — COMPREHENSIVE METABOLIC PANEL
ALT: 50 — ABNORMAL HIGH
AST: 46 — ABNORMAL HIGH
Albumin: 4.2
Alkaline Phosphatase: 99
BUN: 10
CO2: 34 — ABNORMAL HIGH
Calcium: 9.7
Chloride: 88 — ABNORMAL LOW
Creatinine, Ser: 0.8
GFR calc Af Amer: >60
GFR calc non Af Amer: >60
Glucose, Bld: 110 — ABNORMAL HIGH
Potassium: 2.8 — ABNORMAL LOW
Sodium: 130 — ABNORMAL LOW
Total Bilirubin: 0.6
Total Protein: 7.2

## 2011-07-05 LAB — CLOSTRIDIUM DIFFICILE EIA: C difficile Toxins A+B, EIA: NEGATIVE

## 2011-07-05 LAB — CBC
HCT: 35.7 — ABNORMAL LOW
Hemoglobin: 12.4
MCHC: 34.6
MCV: 88.6
Platelets: 328
RBC: 4.03
RDW: 13
WBC: 7

## 2011-07-05 LAB — DIFFERENTIAL
Basophils Absolute: 0.1
Basophils Relative: 2 — ABNORMAL HIGH
Eosinophils Absolute: 0.1
Eosinophils Relative: 1
Lymphocytes Relative: 10 — ABNORMAL LOW
Lymphs Abs: 0.7
Monocytes Absolute: 0.4
Monocytes Relative: 5
Neutro Abs: 5.7
Neutrophils Relative %: 82 — ABNORMAL HIGH

## 2011-07-05 LAB — LIPASE, BLOOD: Lipase: 31

## 2011-07-05 LAB — OCCULT BLOOD X 1 CARD TO LAB, STOOL: Fecal Occult Bld: POSITIVE

## 2011-08-07 ENCOUNTER — Ambulatory Visit (INDEPENDENT_AMBULATORY_CARE_PROVIDER_SITE_OTHER): Payer: BC Managed Care – PPO | Admitting: Internal Medicine

## 2011-08-07 ENCOUNTER — Encounter: Payer: Self-pay | Admitting: Internal Medicine

## 2011-08-07 DIAGNOSIS — Z0000 Encounter for general adult medical examination without abnormal findings: Secondary | ICD-10-CM

## 2011-08-07 DIAGNOSIS — M949 Disorder of cartilage, unspecified: Secondary | ICD-10-CM

## 2011-08-07 DIAGNOSIS — M899 Disorder of bone, unspecified: Secondary | ICD-10-CM

## 2011-08-07 LAB — CBC WITH DIFFERENTIAL/PLATELET
Basophils Absolute: 0 10*3/uL (ref 0.0–0.1)
Basophils Relative: 0.1 % (ref 0.0–3.0)
Eosinophils Absolute: 0.2 10*3/uL (ref 0.0–0.7)
Eosinophils Relative: 2.4 % (ref 0.0–5.0)
HCT: 37.5 % (ref 36.0–46.0)
Hemoglobin: 12.7 g/dL (ref 12.0–15.0)
Lymphocytes Relative: 10.9 % — ABNORMAL LOW (ref 12.0–46.0)
Lymphs Abs: 0.8 10*3/uL (ref 0.7–4.0)
MCHC: 33.8 g/dL (ref 30.0–36.0)
MCV: 92.8 fl (ref 78.0–100.0)
Monocytes Absolute: 0.2 10*3/uL (ref 0.1–1.0)
Monocytes Relative: 3.3 % (ref 3.0–12.0)
Neutro Abs: 6.2 10*3/uL (ref 1.4–7.7)
Neutrophils Relative %: 83.3 % — ABNORMAL HIGH (ref 43.0–77.0)
Platelets: 353 10*3/uL (ref 150.0–400.0)
RBC: 4.03 Mil/uL (ref 3.87–5.11)
RDW: 13.4 % (ref 11.5–14.6)
WBC: 7.4 10*3/uL (ref 4.5–10.5)

## 2011-08-07 LAB — COMPREHENSIVE METABOLIC PANEL
ALT: 24 U/L (ref 0–35)
AST: 31 U/L (ref 0–37)
Albumin: 4.1 g/dL (ref 3.5–5.2)
Alkaline Phosphatase: 87 U/L (ref 39–117)
BUN: 17 mg/dL (ref 6–23)
CO2: 28 mEq/L (ref 19–32)
Calcium: 9.8 mg/dL (ref 8.4–10.5)
Chloride: 100 mEq/L (ref 96–112)
Creatinine, Ser: 0.8 mg/dL (ref 0.4–1.2)
GFR: 74.03 mL/min (ref >60.00)
Glucose, Bld: 88 mg/dL (ref 70–99)
Potassium: 5.1 mEq/L (ref 3.5–5.1)
Sodium: 136 mEq/L (ref 135–145)
Total Bilirubin: 0.1 mg/dL — ABNORMAL LOW (ref 0.3–1.2)
Total Protein: 7.4 g/dL (ref 6.0–8.3)

## 2011-08-07 LAB — LIPID PANEL
Cholesterol: 141 mg/dL (ref 0–200)
HDL: 55.4 mg/dL (ref >39.00)
LDL Cholesterol: 64 mg/dL (ref 0–99)
Total CHOL/HDL Ratio: 3
Triglycerides: 109 mg/dL (ref 0.0–149.0)
VLDL: 21.8 mg/dL (ref 0.0–40.0)

## 2011-08-07 MED ORDER — AMLODIPINE BESY-BENAZEPRIL HCL 5-10 MG PO CAPS: 1.0000 | ORAL_CAPSULE | Freq: Every day | ORAL | Status: DC

## 2011-08-07 MED ORDER — SIMVASTATIN 20 MG PO TABS: 20.0000 mg | ORAL_TABLET | Freq: Every day | ORAL | Status: DC

## 2011-08-07 MED ORDER — FINASTERIDE 1 MG PO TABS: 1.0000 mg | ORAL_TABLET | Freq: Every day | ORAL | Status: DC

## 2011-08-07 MED ORDER — RALOXIFENE HCL 60 MG PO TABS: 60.0000 mg | ORAL_TABLET | Freq: Every day | ORAL | Status: DC

## 2011-08-07 MED ORDER — HYDROCHLOROTHIAZIDE 12.5 MG PO CAPS: 12.5000 mg | ORAL_CAPSULE | Freq: Every day | ORAL | Status: DC

## 2011-08-07 MED ORDER — CLONAZEPAM 0.5 MG PO TABS: 0.5000 mg | ORAL_TABLET | Freq: Three times a day (TID) | ORAL | Status: DC | PRN

## 2011-08-07 MED ORDER — POTASSIUM CHLORIDE CRYS ER 20 MEQ PO TBCR: 20.0000 meq | EXTENDED_RELEASE_TABLET | Freq: Every day | ORAL | Status: DC

## 2011-08-07 NOTE — Assessment & Plan Note (Addendum)
Td 5-10 Flu shot--- declined , "never get them" pneumonia shot -- declined Shingles immunization? Had shingles summer 2012  diet and  exercise discussed  PAP per gyn (sees gyn q 2 years) Strong FH breast ca, her gyn did a genetic testing per pt  last MMG 9-10, states had one 2011; due for one 2012: strongly recommend to go! Breast exam done today, does SBE  Colonoscopy:  04/18/2008, hemorrhoids. GI rec iFOB starting 2012, Cscope 2019 ; iFOB provided  Palpable Ao: U?S neg for AAA 6-10 Diet-exercise discussed

## 2011-08-07 NOTE — Progress Notes (Signed)
  Subjective:    Patient ID: Linda Jefferson, female    DOB: 1946/06/19, 65 y.o.   MRN: [id number]  HPI CPX  Past Medical History  Diagnosis Date  . 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 Normal cardiac stress test 04/2009  . Hypertension   . Carotid artery disease     per u/s 01/2009- repeated u/s 7/11 "mild dz, no stenosis"  . Hyperlipidemia   . LBP (low back pain) 3/11    after a local injection, f/u [facility]  . Osteopenia    Past Surgical History  Procedure Date  . Cholecystectomy   . Carpal tunnel release    History   Social History  . Marital Status: Married    Spouse Name: N/A    Number of Children: 3  . Years of Education: N/A   Occupational History  . TEACHER    Social History Main Topics  . Smoking status: Never Smoker   . Smokeless tobacco: Never Used  . Alcohol Use: Yes     socially   . Drug Use: No  . Sexually Active: Not on file   Other Topics Concern  . Not on file   Social History Narrative   Diet: usually healthy----Exercise: not regularly ---   Family History  Problem Relation Age of Onset  . Coronary artery disease Mother 9  . Hypertension      M family  . Stroke Mother   . Diabetes Mother   . Breast cancer Mother     M, 2 sisters, 1 aunt  . Colon cancer Neg Hx      Review of Systems  Constitutional: Negative for fever and fatigue.  Respiratory: Negative for cough and shortness of breath.   Cardiovascular: Negative for chest pain and leg swelling.  Gastrointestinal: Negative for abdominal pain and blood in stool.  Genitourinary: Negative for dysuria, hematuria and difficulty urinating.  Musculoskeletal:       Had back pain 2011, better this year       Objective:   Physical Exam  Constitutional: She is oriented to person, place, and time. She appears well-developed and well-nourished. No distress.  HENT:  Head: Normocephalic and atraumatic.  Neck: No thyromegaly present.  Cardiovascular: Normal rate, regular  rhythm and normal heart sounds.   No murmur heard. Pulmonary/Chest: Effort normal and breath sounds normal. No respiratory distress. She has no wheezes. She has no rales.  Abdominal: Bowel sounds are normal. She exhibits no distension. There is no tenderness. There is no rebound and no guarding.  Genitourinary:       Breasts exam bilaterally normal, no axillary lymphadenopathy   Musculoskeletal: She exhibits no edema.  Neurological: She is alert and oriented to person, place, and time.  Skin: She is not diaphoretic.  Psychiatric: She has a normal mood and affect. Her behavior is normal. Judgment and thought content normal.          Assessment & Plan:

## 2011-08-07 NOTE — Assessment & Plan Note (Signed)
Last bone density test 04-2010, showed osteopenia, not taking calcium and vitamin D.  Has been on Evista for many years due to her family history of breast cancer  See instructions.

## 2011-08-07 NOTE — Patient Instructions (Signed)
Next visit in 1 year if labs normal , BP at home normal and you are feeling well  otherwise came back in 6 months  Get your mammogram! Take supplemental calcium 1000 mg  and vit D 600 u Exercise (walk) 3 hours a week

## 2011-08-11 ENCOUNTER — Encounter: Payer: Self-pay | Admitting: Internal Medicine

## 2011-08-12 ENCOUNTER — Telehealth: Payer: Self-pay | Admitting: Internal Medicine

## 2011-08-12 MED ORDER — HYDROCODONE-HOMATROPINE 5-1.5 MG/5ML PO SYRP: 5.0000 mL | ORAL_SOLUTION | Freq: Every evening | ORAL | Status: AC | PRN

## 2011-08-12 NOTE — Telephone Encounter (Signed)
See  Prescription:  I recommend Mucinex DM for daytime, hydrocodone for bedtime. Office visit if not improving or if symptoms severe

## 2011-08-12 NOTE — Telephone Encounter (Signed)
Pt c/o cough, post nasal drip and some congestion and unable to sleep at night due to cough. Pt states that she would like to get a cough med call. 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 Pt last seen on 08-07-11 for CPX.Please advise

## 2011-08-16 ENCOUNTER — Other Ambulatory Visit: Payer: BC Managed Care – PPO

## 2011-08-21 ENCOUNTER — Other Ambulatory Visit: Payer: Self-pay | Admitting: Internal Medicine

## 2011-08-21 DIAGNOSIS — Z5181 Encounter for therapeutic drug level monitoring: Secondary | ICD-10-CM

## 2011-08-21 DIAGNOSIS — Z1211 Encounter for screening for malignant neoplasm of colon: Secondary | ICD-10-CM

## 2011-08-21 LAB — FECAL OCCULT BLOOD, IMMUNOCHEMICAL: Fecal Occult Bld: NEGATIVE

## 2012-03-03 ENCOUNTER — Other Ambulatory Visit: Payer: Self-pay | Admitting: Internal Medicine

## 2012-03-03 MED ORDER — CLONAZEPAM 0.5 MG PO TABS: 0.5000 mg | ORAL_TABLET | Freq: Three times a day (TID) | ORAL | Status: DC | PRN

## 2012-03-03 NOTE — Telephone Encounter (Signed)
90, 1 RF 

## 2012-03-03 NOTE — Telephone Encounter (Signed)
Ok to refill 

## 2012-03-03 NOTE — Telephone Encounter (Signed)
Refill done.  

## 2012-03-03 NOTE — Telephone Encounter (Signed)
refill clonazepam 0.5mg  tablet Take 1-tablet by mouth 3 times a day as needed Last written 10.31.12, qty 90 wt/5-refills  Last ov 10.31.12

## 2012-03-26 ENCOUNTER — Other Ambulatory Visit: Payer: Self-pay | Admitting: Orthopedic Surgery

## 2012-03-27 ENCOUNTER — Encounter (HOSPITAL_BASED_OUTPATIENT_CLINIC_OR_DEPARTMENT_OTHER): Payer: Self-pay | Admitting: *Deleted

## 2012-03-27 ENCOUNTER — Encounter (HOSPITAL_BASED_OUTPATIENT_CLINIC_OR_DEPARTMENT_OTHER)
Admission: RE | Admit: 2012-03-27 | Discharge: 2012-03-27 | Disposition: A | Discharge status: Home / Self Care | Payer: BC Managed Care – PPO | Source: Ambulatory Visit | Attending: Orthopedic Surgery | Admitting: Orthopedic Surgery

## 2012-03-27 LAB — BASIC METABOLIC PANEL
BUN: 28 mg/dL — ABNORMAL HIGH (ref 6–23)
CO2: 30 mEq/L (ref 19–32)
Calcium: 10.2 mg/dL (ref 8.4–10.5)
Chloride: 98 mEq/L (ref 96–112)
Creatinine, Ser: 0.93 mg/dL (ref 0.50–1.10)
GFR calc Af Amer: 72 mL/min — ABNORMAL LOW (ref >90)
GFR calc non Af Amer: 62 mL/min — ABNORMAL LOW (ref >90)
Glucose, Bld: 100 mg/dL — ABNORMAL HIGH (ref 70–99)
Potassium: 4.5 mEq/L (ref 3.5–5.1)
Sodium: 138 mEq/L (ref 135–145)

## 2012-03-27 NOTE — Progress Notes (Signed)
Will need bmet-ekg-

## 2012-03-30 NOTE — H&P (Signed)
  Linda Jefferson is an 66 y.o. female.   Chief Complaint: c/o chronic and progressive STS symptoms of the right index finger HPI:  Patient is a 66 y/o female who presented in August of 2012 for evaluation of pain with flexion of her right index finger. She had seen her primary care MD and was referred for evaluation and treatment. She underwent two injections with the last being about 6 months ago. Approximately 1 month ago her symptoms recurred and she returned for evaluation. She wishes to proceed with surgical intervention.  Past Medical History  Diagnosis Date  . 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 Normal cardiac stress test 04/2009  . Hypertension   . Carotid artery disease     per u/s 01/2009- repeated u/s 7/11 "mild dz, no stenosis"  . Hyperlipidemia   . LBP (low back pain) 3/11    after a local injection, f/u [facility]  . Osteopenia   . Wears hearing aid   . Seasonal allergies     Past Surgical History  Procedure Date  . Cholecystectomy   . Carpal tunnel release     Family History  Problem Relation Age of Onset  . Coronary artery disease Mother 4  . Hypertension      M family  . Stroke Mother   . Diabetes Mother   . Breast cancer Mother     M, 2 sisters, 1 aunt  . Colon cancer Neg Hx    Social History:  reports that she has never smoked. She has never used smokeless tobacco. She reports that she drinks alcohol. She reports that she does not use illicit drugs.  Allergies:  Allergies  Allergen Reactions  . Pseudoeph-Doxylamine-Dm-Apap     No prescriptions prior to admission    No results found for this or any previous visit (from the past 48 hour(s)).  No results found.   Pertinent items are noted in HPI.  There were no vitals taken for this visit.  General appearance: alert Head: Normocephalic, without obvious abnormality Neck: supple, symmetrical, trachea midline Resp: clear to auscultation bilaterally Cardio: regular rate and  rhythm GI: normal findings: bowel sounds normal Extremities: Active stenosing tenosynovitis of the right index finger at the A-1 pulley level. FROM of the other digits, Neurovascularly intact. Pulses: 2+ and symmetric Skin: normal Neurologic: Grossly normal    Assessment/Plan Impression: Chronic STS right index finger  Plan: Patient to the OR for release A-1 pulley right index finger. The procedure, risks and post-op course were discussed with the patient at length and she was in agreement with the plan.  DASNOIT,Lorri Fukuhara J 03/30/2012, 4:50 PM    H&P documentation: 03/31/2012  -History and Physical Reviewed  -Patient has been re-examined  -No change in the plan of care  Wyn Forster, MD

## 2012-03-31 ENCOUNTER — Ambulatory Visit (HOSPITAL_BASED_OUTPATIENT_CLINIC_OR_DEPARTMENT_OTHER): Payer: BC Managed Care – PPO | Admitting: Certified Registered"

## 2012-03-31 ENCOUNTER — Encounter (HOSPITAL_BASED_OUTPATIENT_CLINIC_OR_DEPARTMENT_OTHER): Payer: Self-pay | Admitting: Certified Registered"

## 2012-03-31 ENCOUNTER — Encounter (HOSPITAL_BASED_OUTPATIENT_CLINIC_OR_DEPARTMENT_OTHER)
Admission: RE | Disposition: A | Discharge status: Home / Self Care | Payer: Self-pay | Source: Ambulatory Visit | Attending: Orthopedic Surgery

## 2012-03-31 ENCOUNTER — Ambulatory Visit (HOSPITAL_BASED_OUTPATIENT_CLINIC_OR_DEPARTMENT_OTHER)
Admission: RE | Admit: 2012-03-31 | Discharge: 2012-03-31 | Disposition: A | Discharge status: Home / Self Care | Payer: BC Managed Care – PPO | Source: Ambulatory Visit | Attending: Orthopedic Surgery | Admitting: Orthopedic Surgery

## 2012-03-31 ENCOUNTER — Encounter (HOSPITAL_BASED_OUTPATIENT_CLINIC_OR_DEPARTMENT_OTHER): Payer: Self-pay | Admitting: *Deleted

## 2012-03-31 DIAGNOSIS — I10 Essential (primary) hypertension: Secondary | ICD-10-CM | POA: Insufficient documentation

## 2012-03-31 DIAGNOSIS — H9190 Unspecified hearing loss, unspecified ear: Secondary | ICD-10-CM | POA: Insufficient documentation

## 2012-03-31 DIAGNOSIS — M65839 Other synovitis and tenosynovitis, unspecified forearm: Secondary | ICD-10-CM | POA: Insufficient documentation

## 2012-03-31 DIAGNOSIS — Z01810 Encounter for preprocedural cardiovascular examination: Secondary | ICD-10-CM | POA: Insufficient documentation

## 2012-03-31 DIAGNOSIS — E785 Hyperlipidemia, unspecified: Secondary | ICD-10-CM | POA: Insufficient documentation

## 2012-03-31 DIAGNOSIS — M545 Low back pain, unspecified: Secondary | ICD-10-CM | POA: Insufficient documentation

## 2012-03-31 DIAGNOSIS — M65849 Other synovitis and tenosynovitis, unspecified hand: Secondary | ICD-10-CM | POA: Insufficient documentation

## 2012-03-31 HISTORY — DX: Presence of external hearing-aid: Z97.4

## 2012-03-31 HISTORY — DX: Other seasonal allergic rhinitis: J30.2

## 2012-03-31 HISTORY — PX: TRIGGER FINGER RELEASE: SHX641

## 2012-03-31 LAB — POCT HEMOGLOBIN-HEMACUE: Hemoglobin: 12.2 g/dL (ref 12.0–15.0)

## 2012-03-31 SURGERY — RELEASE, A1 PULLEY, FOR TRIGGER FINGER
Anesthesia: Monitor Anesthesia Care | Site: Hand | Laterality: Right | Wound class: Clean

## 2012-03-31 MED ORDER — LIDOCAINE HCL (CARDIAC) 20 MG/ML IV SOLN
INTRAVENOUS | Status: DC | PRN
  Administered 2012-03-31: 30 mg via INTRAVENOUS

## 2012-03-31 MED ORDER — LACTATED RINGERS IV SOLN
INTRAVENOUS | Status: DC
  Administered 2012-03-31: 07:00:00 via INTRAVENOUS

## 2012-03-31 MED ORDER — CHLORHEXIDINE GLUCONATE 4 % EX LIQD: 60.0000 mL | Freq: Once | CUTANEOUS | Status: DC

## 2012-03-31 MED ORDER — ONDANSETRON HCL 4 MG/2ML IJ SOLN: 4.0000 mg | Freq: Once | INTRAMUSCULAR | Status: DC | PRN

## 2012-03-31 MED ORDER — FENTANYL CITRATE 0.05 MG/ML IJ SOLN
INTRAMUSCULAR | Status: DC | PRN
  Administered 2012-03-31: 100 ug via INTRAVENOUS

## 2012-03-31 MED ORDER — ONDANSETRON HCL 4 MG/2ML IJ SOLN
INTRAMUSCULAR | Status: DC | PRN
  Administered 2012-03-31: 4 mg via INTRAVENOUS

## 2012-03-31 MED ORDER — HYDROMORPHONE HCL PF 1 MG/ML IJ SOLN: 0.2500 mg | INTRAMUSCULAR | Status: DC | PRN

## 2012-03-31 MED ORDER — HYDROCODONE-ACETAMINOPHEN 5-325 MG PO TABS: ORAL_TABLET | ORAL | Status: AC

## 2012-03-31 MED ORDER — MIDAZOLAM HCL 5 MG/5ML IJ SOLN
INTRAMUSCULAR | Status: DC | PRN
  Administered 2012-03-31: 1 mg via INTRAVENOUS

## 2012-03-31 MED ORDER — LIDOCAINE HCL 2 % IJ SOLN
INTRAMUSCULAR | Status: DC | PRN
  Administered 2012-03-31: 1 mL via INTRADERMAL

## 2012-03-31 MED ORDER — PROPOFOL 10 MG/ML IV EMUL
INTRAVENOUS | Status: DC | PRN
  Administered 2012-03-31: 100 ug/kg/min via INTRAVENOUS

## 2012-03-31 SURGICAL SUPPLY — 31 items
BLADE SURG 15 STRL LF DISP TIS (BLADE) ×1 IMPLANT
BLADE SURG 15 STRL SS (BLADE) ×2
BNDG CMPR 9X4 STRL LF SNTH (GAUZE/BANDAGES/DRESSINGS)
BNDG CMPR MD 5X2 ELC HKLP STRL (GAUZE/BANDAGES/DRESSINGS) ×1
BNDG ELASTIC 2 VLCR STRL LF (GAUZE/BANDAGES/DRESSINGS) ×2 IMPLANT
BNDG ESMARK 4X9 LF (GAUZE/BANDAGES/DRESSINGS) IMPLANT
BRUSH SCRUB EZ PLAIN DRY (MISCELLANEOUS) ×2 IMPLANT
CLOTH BEACON ORANGE TIMEOUT ST (SAFETY) ×2 IMPLANT
CORDS BIPOLAR (ELECTRODE) ×2 IMPLANT
COVER MAYO STAND STRL (DRAPES) ×2 IMPLANT
COVER TABLE BACK 60X90 (DRAPES) ×2 IMPLANT
CUFF TOURNIQUET SINGLE 18IN (TOURNIQUET CUFF) ×2 IMPLANT
DECANTER SPIKE VIAL GLASS SM (MISCELLANEOUS) IMPLANT
DRAPE EXTREMITY T 121X128X90 (DRAPE) ×2 IMPLANT
DRAPE SURG 17X23 STRL (DRAPES) ×2 IMPLANT
GAUZE SPONGE 4X4 12PLY STRL LF (GAUZE/BANDAGES/DRESSINGS) ×4 IMPLANT
GAUZE XEROFORM 1X8 LF (GAUZE/BANDAGES/DRESSINGS) ×2 IMPLANT
GLOVE BIOGEL M STRL SZ7.5 (GLOVE) ×2 IMPLANT
GLOVE ORTHO TXT STRL SZ7.5 (GLOVE) ×2 IMPLANT
GOWN PREVENTION PLUS XLARGE (GOWN DISPOSABLE) ×2 IMPLANT
GOWN STRL REIN XL XLG (GOWN DISPOSABLE) ×4 IMPLANT
NEEDLE 27GAX1X1/2 (NEEDLE) IMPLANT
PACK BASIN DAY SURGERY FS (CUSTOM PROCEDURE TRAY) ×2 IMPLANT
PAD CAST 4YDX4 CTTN HI CHSV (CAST SUPPLIES) ×1 IMPLANT
PADDING CAST COTTON 4X4 STRL (CAST SUPPLIES) ×2
SPONGE GAUZE 4X4 12PLY (GAUZE/BANDAGES/DRESSINGS) ×2 IMPLANT
STOCKINETTE 4X48 STRL (DRAPES) ×2 IMPLANT
SYR CONTROL 10ML LL (SYRINGE) IMPLANT
TOWEL OR 17X24 6PK STRL BLUE (TOWEL DISPOSABLE) ×2 IMPLANT
UNDERPAD 30X30 INCONTINENT (UNDERPADS AND DIAPERS) ×2 IMPLANT
WATER STERILE IRR 1000ML POUR (IV SOLUTION) IMPLANT

## 2012-03-31 NOTE — Transfer of Care (Signed)
Immediate Anesthesia Transfer of Care Note  Patient: Linda Jefferson  Procedure(s) Performed: Procedure(s) (LRB): RELEASE TRIGGER FINGER/A-1 PULLEY (Right)  Patient Location: PACU  Anesthesia Type: General  Level of Consciousness: awake, alert , oriented and patient cooperative  Airway & Oxygen Therapy: Patient Spontanous Breathing  Post-op Assessment: Report given to PACU RN and Post -op Vital signs reviewed and stable  Post vital signs: Reviewed and stable  Complications: No apparent anesthesia complications

## 2012-03-31 NOTE — Discharge Instructions (Signed)

## 2012-03-31 NOTE — Op Note (Signed)
NAMEKJERSTI, DITTMER NO.:  [id number]  MEDICAL RECORD NO.:  [id number]  LOCATION:                                 FACILITY:  PHYSICIAN:  Katy Fitch. Nickcole Bralley, M.D.      DATE OF BIRTH:  DATE OF PROCEDURE:  03/31/2012 DATE OF DISCHARGE:                              OPERATIVE REPORT   PREOPERATIVE DIAGNOSES:  Chronic stenosing tenosynovitis, right index finger A1 pulley.  POSTOPERATIVE DIAGNOSES:  Chronic stenosing tenosynovitis, right index finger A1 pulley.  OPERATION:  Release of right index finger A1 pulley.  OPERATING SURGEON:  Katy Fitch. Maila Dukes, M.D.  ASSISTANT:  Marveen Reeks Dasnoit, PA-C  ANESTHESIA:  General sedation/2% lidocaine flexor sheath block and metacarpal head level block of right index finger.  SUPERVISING ANESTHESIOLOGIST:  Sheldon Silvan, M.D.  INDICATIONS:  The patient is a 66 year old woman we followed for many months, who has chronic stenosing spinous of her right index finger at the A1 pulley.  Due to failure to respond to nonoperative measures.  She is brought to the operative room at this time for release of her right index finger A1 pulley.  After informed consent, she is brought to the operating room at this time.  PROCEDURE:  The patient was brought to room #8 of the [facility] and placed supine position on operating table.  Following anesthesia and informed consent.  IV sedation was provided. When the satisfactory level of sedation was achieved, the palm of the right hand was prepped with alcohol Betadine followed by infiltration of 1 mL of 2% lidocaine into the skin and flexor sheath.  After few moments, excellent anesthesia was achieved.  The right hand and arm were then prepped with Betadine soap solution, sterilely draped.  A pneumatic tourniquet was applied proximal brachium.  Following exsanguination right arm with Esmarch bandage, arterial tourniquet was inflated to 250 mmHg.  Following routine surgical  time- out procedure commenced with oblique incision in the distal palmar crease directly over the A1 pulley.  Subcu tissues were carefully divided taking care to gently retract the neurovascular bundles.  The palmar fascia was released with scissors followed by identification release of the A1 pulley.  A small A0 pulley was identified and released.  The tendons delivered thereafter the patient demonstrated full active range of motion of the finger in flexion and extension.  The wound was repaired with intradermal 3-0 Prolene suture.  Steri- Strips applied.  A compressive dressing was applied with sterile gauze and an Ace wrap.  For aftercare, the patient is advised to begin immediate range of motion exercises.  She will return to our office for followup in 1 week for suture removal.  If her dressing becomes soiled, to contact our office, and return sooner for dressing change.     Katy Fitch Khaila Velarde, M.D.     RVS/MEDQ  D:  03/31/2012  T:  03/31/2012  Job:  [id number]

## 2012-03-31 NOTE — Brief Op Note (Signed)
03/31/2012  7:50 AM  PATIENT:  Linda Jefferson  66 y.o. female  PRE-OPERATIVE DIAGNOSIS:  trigger finger right index  POST-OPERATIVE DIAGNOSIS:  trigger finger right index  PROCEDURE:  Procedure(s) (LRB): RELEASE TRIGGER FINGER/A-1 PULLEY right index  SURGEON:  Surgeon(s) and Role:    * Wyn Forster., MD - Primary  PHYSICIAN ASSISTANT:   ASSISTANT:  Mallory Shirk.A-C    ANESTHESIA:   local  EBL:  Total I/O In: 50 [I.V.:50] Out: -   BLOOD ADMINISTERED:none  DRAINS: none   LOCAL MEDICATIONS USED:  XYLOCAINE   SPECIMEN:  No Specimen  DISPOSITION OF SPECIMEN:  N/A  COUNTS:  YES  TOURNIQUET:   Total Tourniquet Time Documented: Upper Arm (Right) - 4 minutes  DICTATION: .Other Dictation: Dictation Number [id number]  PLAN OF CARE: Discharge to home after PACU  PATIENT DISPOSITION:  PACU - hemodynamically stable.

## 2012-03-31 NOTE — Anesthesia Preprocedure Evaluation (Signed)
Anesthesia Evaluation  Patient identified by MRN, date of birth, ID band Patient awake    Reviewed: Allergy & Precautions, H&P , NPO status , Patient's Chart, lab work & pertinent test results  Airway Mallampati: I TM Distance: >3 FB Neck ROM: Full    Dental  (+) Teeth Intact   Pulmonary  breath sounds clear to auscultation        Cardiovascular hypertension, Pt. on medications Rhythm:Regular Rate:Normal     Neuro/Psych    GI/Hepatic   Endo/Other    Renal/GU      Musculoskeletal   Abdominal   Peds  Hematology   Anesthesia Other Findings   Reproductive/Obstetrics                           Anesthesia Physical Anesthesia Plan  ASA: II  Anesthesia Plan: MAC   Post-op Pain Management:    Induction: Intravenous  Airway Management Planned: Simple Face Mask  Additional Equipment:   Intra-op Plan:   Post-operative Plan:   Informed Consent: I have reviewed the patients History and Physical, chart, labs and discussed the procedure including the risks, benefits and alternatives for the proposed anesthesia with the patient or authorized representative who has indicated his/her understanding and acceptance.     Plan Discussed with: CRNA, Anesthesiologist and Surgeon  Anesthesia Plan Comments:         Anesthesia Quick Evaluation

## 2012-03-31 NOTE — Anesthesia Procedure Notes (Signed)
Procedure Name: MAC Date/Time: 03/31/2012 7:33 AM Performed by: Verlan Friends Pre-anesthesia Checklist: Patient identified, Timeout performed, Emergency Drugs available, Suction available and Patient being monitored Patient Re-evaluated:Patient Re-evaluated prior to inductionOxygen Delivery Method: Simple face mask Placement Confirmation: positive ETCO2

## 2012-03-31 NOTE — Anesthesia Postprocedure Evaluation (Signed)
  Anesthesia Post-op Note  Patient: Linda Jefferson  Procedure(s) Performed: Procedure(s) (LRB): RELEASE TRIGGER FINGER/A-1 PULLEY (Right)  Patient Location: PACU  Anesthesia Type: General  Level of Consciousness: awake, alert  and oriented  Airway and Oxygen Therapy: Patient Spontanous Breathing  Post-op Pain: mild  Post-op Assessment: Post-op Vital signs reviewed  Post-op Vital Signs: Reviewed  Complications: No apparent anesthesia complications

## 2012-04-01 ENCOUNTER — Encounter (HOSPITAL_BASED_OUTPATIENT_CLINIC_OR_DEPARTMENT_OTHER): Payer: Self-pay | Admitting: Orthopedic Surgery

## 2012-04-27 ENCOUNTER — Telehealth: Payer: Self-pay | Admitting: Internal Medicine

## 2012-04-27 MED ORDER — CLONAZEPAM 0.5 MG PO TABS: 0.5000 mg | ORAL_TABLET | Freq: Three times a day (TID) | ORAL | Status: DC | PRN

## 2012-04-27 NOTE — Telephone Encounter (Signed)
90, 3 RF 

## 2012-04-27 NOTE — Telephone Encounter (Signed)
Ok to refill 

## 2012-04-27 NOTE — Telephone Encounter (Signed)
Refill done.  

## 2012-04-27 NOTE — Telephone Encounter (Signed)
Refill: Clonazepam 0.5mg  tablet. Take 1 tablet by mouth 3 times a day as needed.

## 2012-07-30 ENCOUNTER — Other Ambulatory Visit: Payer: Self-pay | Admitting: Internal Medicine

## 2012-07-31 ENCOUNTER — Encounter: Payer: Self-pay | Admitting: Internal Medicine

## 2012-07-31 NOTE — Telephone Encounter (Signed)
Refill done.  

## 2012-08-21 ENCOUNTER — Other Ambulatory Visit: Payer: Self-pay | Admitting: Internal Medicine

## 2012-08-24 ENCOUNTER — Other Ambulatory Visit: Payer: Self-pay | Admitting: Internal Medicine

## 2012-08-24 NOTE — Telephone Encounter (Signed)
Refill done. Pt must schedule OV for future refills.

## 2012-08-24 NOTE — Telephone Encounter (Signed)
Refill done. Pt must schedule OV for future refills.  

## 2012-09-01 ENCOUNTER — Other Ambulatory Visit: Payer: Self-pay | Admitting: Internal Medicine

## 2012-09-01 NOTE — Telephone Encounter (Signed)
Discussed with pt. Scheduled appt for 12.4.13

## 2012-09-01 NOTE — Telephone Encounter (Signed)
Pt has not been seen within a year & no pending appt. OK to refill?

## 2012-09-01 NOTE — Telephone Encounter (Signed)
Arrange a office visit within 2 weeks. Send a 2 week supply only.

## 2012-09-02 ENCOUNTER — Encounter: Payer: Self-pay | Admitting: Internal Medicine

## 2012-09-02 ENCOUNTER — Ambulatory Visit (INDEPENDENT_AMBULATORY_CARE_PROVIDER_SITE_OTHER): Payer: BC Managed Care – PPO | Admitting: Internal Medicine

## 2012-09-02 VITALS — BP 128/74 | HR 60 | Temp 97.7°F | Wt 119.0 lb

## 2012-09-02 DIAGNOSIS — I6529 Occlusion and stenosis of unspecified carotid artery: Secondary | ICD-10-CM

## 2012-09-02 DIAGNOSIS — M949 Disorder of cartilage, unspecified: Secondary | ICD-10-CM

## 2012-09-02 DIAGNOSIS — M899 Disorder of bone, unspecified: Secondary | ICD-10-CM

## 2012-09-02 DIAGNOSIS — Z0000 Encounter for general adult medical examination without abnormal findings: Secondary | ICD-10-CM

## 2012-09-02 LAB — CBC WITH DIFFERENTIAL/PLATELET
Basophils Absolute: 0 10*3/uL (ref 0.0–0.1)
Basophils Relative: 0 % (ref 0–1)
Eosinophils Absolute: 0.2 10*3/uL (ref 0.0–0.7)
Eosinophils Relative: 4 % (ref 0–5)
HCT: 38.9 % (ref 36.0–46.0)
Hemoglobin: 13.1 g/dL (ref 12.0–15.0)
Lymphocytes Relative: 25 % (ref 12–46)
Lymphs Abs: 1.4 10*3/uL (ref 0.7–4.0)
MCH: 29.6 pg (ref 26.0–34.0)
MCHC: 33.7 g/dL (ref 30.0–36.0)
MCV: 88 fL (ref 78.0–100.0)
Monocytes Absolute: 0.4 10*3/uL (ref 0.1–1.0)
Monocytes Relative: 7 % (ref 3–12)
Neutro Abs: 3.6 10*3/uL (ref 1.7–7.7)
Neutrophils Relative %: 64 % (ref 43–77)
Platelets: 419 10*3/uL — ABNORMAL HIGH (ref 150–400)
RBC: 4.42 MIL/uL (ref 3.87–5.11)
RDW: 13.8 % (ref 11.5–15.5)
WBC: 5.6 10*3/uL (ref 4.0–10.5)

## 2012-09-02 LAB — LIPID PANEL
Cholesterol: 121 mg/dL (ref 0–200)
HDL: 56 mg/dL (ref >39)
LDL Cholesterol: 50 mg/dL (ref 0–99)
Total CHOL/HDL Ratio: 2.2 Ratio
Triglycerides: 73 mg/dL (ref <150)
VLDL: 15 mg/dL (ref 0–40)

## 2012-09-02 LAB — COMPREHENSIVE METABOLIC PANEL
ALT: 20 U/L (ref 0–35)
AST: 32 U/L (ref 0–37)
Albumin: 4.6 g/dL (ref 3.5–5.2)
Alkaline Phosphatase: 89 U/L (ref 39–117)
BUN: 12 mg/dL (ref 6–23)
CO2: 32 mEq/L (ref 19–32)
Calcium: 10.3 mg/dL (ref 8.4–10.5)
Chloride: 97 mEq/L (ref 96–112)
Creat: 0.73 mg/dL (ref 0.50–1.10)
Glucose, Bld: 88 mg/dL (ref 70–99)
Potassium: 5.1 mEq/L (ref 3.5–5.3)
Sodium: 139 mEq/L (ref 135–145)
Total Bilirubin: 0.5 mg/dL (ref 0.3–1.2)
Total Protein: 7 g/dL (ref 6.0–8.3)

## 2012-09-02 LAB — TSH: TSH: 1.633 u[IU]/mL (ref 0.350–4.500)

## 2012-09-02 MED ORDER — SIMVASTATIN 20 MG PO TABS: 20.0000 mg | ORAL_TABLET | Freq: Every day | ORAL | Status: DC

## 2012-09-02 MED ORDER — POTASSIUM CHLORIDE CRYS ER 20 MEQ PO TBCR: 20.0000 meq | EXTENDED_RELEASE_TABLET | Freq: Every day | ORAL | Status: DC

## 2012-09-02 MED ORDER — FINASTERIDE 1 MG PO TABS: 1.0000 mg | ORAL_TABLET | Freq: Every day | ORAL | Status: DC

## 2012-09-02 MED ORDER — RALOXIFENE HCL 60 MG PO TABS: 60.0000 mg | ORAL_TABLET | Freq: Every day | ORAL | Status: DC

## 2012-09-02 MED ORDER — CLONAZEPAM 0.5 MG PO TABS: 0.5000 mg | ORAL_TABLET | Freq: Three times a day (TID) | ORAL | Status: DC | PRN

## 2012-09-02 MED ORDER — AMLODIPINE BESY-BENAZEPRIL HCL 5-10 MG PO CAPS: 1.0000 | ORAL_CAPSULE | Freq: Every day | ORAL | Status: DC

## 2012-09-02 MED ORDER — HYDROCHLOROTHIAZIDE 12.5 MG PO CAPS: 12.5000 mg | ORAL_CAPSULE | Freq: Every day | ORAL | Status: DC

## 2012-09-02 NOTE — Patient Instructions (Addendum)
Check the  blood pressure 2 or 3 times a week, be sure it is between 110/60 and 140/85. If it is consistently higher or lower, let me know  

## 2012-09-02 NOTE — Assessment & Plan Note (Addendum)
Td 5-10 Flu shot--- declined , "never get them" pneumonia shot -- declined Shingles immunization? Had shingles summer 2012 , declined "It doesn't guarantee I won't get it again" diet and  Exercise-- doing great  PAP per gyn, Dr Gigi Gin, last PAP ~ 2012, not sure if she will go back to them. Pt is low risk (married once, never had an abnormal PAPs) Strong FH breast ca, her gyn did a genetic testing per pt ; reports last MMG 08-2011, has one schedule for 10-2012  Breast exam done today, does SBE  Colonoscopy:  04/18/2008, hemorrhoids. Next 2019 Also, GI rec iFOB starting 2012  ----> iFOB provided  Palpable Ao: [place] neg for AAA 6-10

## 2012-09-02 NOTE — Assessment & Plan Note (Addendum)
See previous entry. Taking multivitamin that contains calcium and vitamin D. Very active. Recommend to repeat a bone density test--- request to hold off till next year

## 2012-09-02 NOTE — Progress Notes (Signed)
  Subjective:    Patient ID: Linda Jefferson, female    DOB: 1946-05-10, 66 y.o.   MRN: [id number]  HPI  CPX  Past Medical History  Diagnosis Date  . 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 Normal cardiac stress test 04/2009  . Hypertension   . Carotid artery disease     per u/s 01/2009- repeated u/s 7/11 "mild dz, no stenosis"  . Hyperlipidemia   . LBP (low back pain) 3/11    after a local injection, f/u [facility]  . Osteopenia   . Wears hearing aid   . Seasonal allergies    Past Surgical History  Procedure Date  . Cholecystectomy   . Carpal tunnel release   . Trigger finger release 03/31/2012    Procedure: RELEASE TRIGGER FINGER/A-1 PULLEY;  Surgeon: Wyn Forster., MD;  Location: [facility];  Service: Orthopedics;  Laterality: Right;  right index   Family History  Problem Relation Age of Onset  . Coronary artery disease Mother 39  . Hypertension      M family  . Stroke Mother   . Diabetes Mother   . Breast cancer Mother     M, 2 sisters, 1 aunt  . Colon cancer Neg Hx    History   Social History  . Marital Status: Married    Spouse Name: N/A    Number of Children: 3  . Years of Education: N/A   Occupational History  . TEACHER    Social History Main Topics  . Smoking status: Never Smoker   . Smokeless tobacco: Never Used  . Alcohol Use: Yes     Comment: socially   . Drug Use: No  . Sexually Active: Not on file   Other Topics Concern  . Not on file   Social History Narrative   Diet: doing W W, lost ~ 20 punds ----Exercise: improved, now exercises  regularly  ---    Review of Systems Feels well Medications reviewed, good compliance. Ambulatory BPs 120s. Takes Klonopin on average twice a day No chest pain or shortness of breath No nausea, vomiting, diarrhea or blood in the stools. No vaginal discharge or vaginal bleeding.     Objective:   Physical Exam  General -- alert, well-developed, and well-nourished.     Neck --no thyromegaly , normal carotid pulse Breasts-- No mass, nodules, thickening, tenderness, bulging, retraction, inflamation, nipple discharge or skin changes noted.  no axillary lymph nodes Lungs -- normal respiratory effort, no intercostal retractions, no accessory muscle use, and normal breath sounds.   Heart-- normal rate, regular rhythm, no murmur, and no gallop.   Abdomen--soft, non-tender, no distention; palpable nontender aorta, mild bruit?.   Extremities-- no pretibial edema bilaterally  Neurologic-- alert & oriented X3 and strength normal in all extremities. Psych-- Cognition and judgment appear intact. Alert and cooperative with normal attention span and concentration.  not anxious appearing and not depressed appearing.       Assessment & Plan:

## 2012-09-02 NOTE — Assessment & Plan Note (Signed)
Carotid ultrasound 2011: Mild atherosclerotic disease involving the carotid arteries. No significant carotid artery stenosis. Estimated degree of stenosis in the internal carotid arteries is less than 50%. Plan: Control her cardiovascular risk factors

## 2012-09-05 ENCOUNTER — Other Ambulatory Visit: Payer: Self-pay | Admitting: Internal Medicine

## 2012-09-08 ENCOUNTER — Ambulatory Visit: Payer: BC Managed Care – PPO | Admitting: Internal Medicine

## 2012-09-09 ENCOUNTER — Ambulatory Visit: Payer: BC Managed Care – PPO | Admitting: Internal Medicine

## 2012-10-03 ENCOUNTER — Other Ambulatory Visit: Payer: Self-pay | Admitting: Internal Medicine

## 2012-10-05 NOTE — Telephone Encounter (Signed)
Refill done.  

## 2012-10-25 ENCOUNTER — Telehealth: Payer: Self-pay | Admitting: Internal Medicine

## 2012-10-26 NOTE — Telephone Encounter (Signed)
Ok to refill 

## 2012-10-28 NOTE — Telephone Encounter (Signed)
Spoke to [employer] @ pharmacy & she confirmed pt still has refills left on file.  Refill request sent in error.

## 2012-11-03 ENCOUNTER — Encounter: Payer: Self-pay | Admitting: Internal Medicine

## 2012-11-21 ENCOUNTER — Other Ambulatory Visit: Payer: Self-pay

## 2013-03-07 HISTORY — PX: BUNIONECTOMY: SHX129

## 2013-04-02 ENCOUNTER — Other Ambulatory Visit: Payer: Self-pay | Admitting: Internal Medicine

## 2013-04-02 NOTE — Telephone Encounter (Signed)
Ok to refill?  Last OV 09/02/2012.  Last filled 09/02/2012 #90 RF5

## 2013-04-02 NOTE — Telephone Encounter (Signed)
#   30 ; Dr Drue Novel needs to review upon his return

## 2013-04-02 NOTE — Telephone Encounter (Signed)
Refill done.  

## 2013-04-06 ENCOUNTER — Telehealth: Payer: Self-pay | Admitting: Internal Medicine

## 2013-04-07 NOTE — Telephone Encounter (Signed)
Dr. Alwyn Ren filled on 04/02/13 for #30 no refills. Spoke with pharmacy pt. Picked up rx. Spoke with patient and she states that she normally receives a 90 day supply and wanted to address this when Dr. Drue Novel returns to office on 04/12/13. Pt very upset that she received a 30 day Rx.

## 2013-04-11 NOTE — Telephone Encounter (Signed)
Ok to RF 90 days next time

## 2013-04-15 ENCOUNTER — Other Ambulatory Visit: Payer: Self-pay | Admitting: Internal Medicine

## 2013-04-15 NOTE — Telephone Encounter (Signed)
Per previous phone note OK #90.  Refill done.

## 2013-05-12 ENCOUNTER — Other Ambulatory Visit: Payer: Self-pay

## 2013-06-14 ENCOUNTER — Telehealth: Payer: Self-pay | Admitting: Internal Medicine

## 2013-06-14 MED ORDER — CLONAZEPAM 0.5 MG PO TABS: ORAL_TABLET | ORAL | Status: DC

## 2013-06-14 NOTE — Telephone Encounter (Signed)
Advise patient: I am refilling 90, she's due for a physical in November. Please make an appointment .`

## 2013-06-14 NOTE — Telephone Encounter (Signed)
Please advise if ok for pt to receive #90.   Last OV 09-02-12 Med last filled on 7-10 #30.   No UDS on file.

## 2013-06-14 NOTE — Telephone Encounter (Signed)
06/14/2013  Pt came by office, needs refill on Klonopin.  Prescription is for 3xday, but last several have only been filled for 30 pills.  Please correct and fill.  [facility] on [address].  Thank you. bw

## 2013-06-15 ENCOUNTER — Telehealth: Payer: Self-pay | Admitting: *Deleted

## 2013-06-15 NOTE — Telephone Encounter (Signed)
erorr

## 2013-06-15 NOTE — Telephone Encounter (Signed)
Pt notified and appt made for Oct 17th.

## 2013-07-21 ENCOUNTER — Telehealth: Payer: Self-pay

## 2013-07-21 NOTE — Telephone Encounter (Signed)
Medication and allergies: done  [facility] [address] Allegra for local pharmacy   Immunizations due: PNA, Zostavax, cannot have flu vaccine due to egg allergy  A/P:  Last:  PAP:      Gyn       MMG:     UTD 10/2012   Dexa: Not on file CCS:      UTD 04/2008 DM: na HTN:    Due 07/2012 Lipids: Due 07/2012  To Discuss with Provider: Concerns over Rx for Klonopin when you were unavailable to refill Only received 10 instead of the whole Rx-states that what's in her chart should prove that she takes it regularly Has to pay full price and insurance will not cover the remainder Apologized for the confusion and advised that would help in the future needs Retired in June, [place]

## 2013-07-22 ENCOUNTER — Encounter: Payer: Self-pay | Admitting: Lab

## 2013-07-23 ENCOUNTER — Ambulatory Visit (INDEPENDENT_AMBULATORY_CARE_PROVIDER_SITE_OTHER): Payer: Medicare HMO | Admitting: Internal Medicine

## 2013-07-23 ENCOUNTER — Encounter: Payer: Self-pay | Admitting: Internal Medicine

## 2013-07-23 VITALS — BP 132/77 | HR 80 | Temp 98.3°F | Ht 64.2 in | Wt 126.8 lb

## 2013-07-23 DIAGNOSIS — E785 Hyperlipidemia, unspecified: Secondary | ICD-10-CM

## 2013-07-23 DIAGNOSIS — M899 Disorder of bone, unspecified: Secondary | ICD-10-CM

## 2013-07-23 DIAGNOSIS — F419 Anxiety disorder, unspecified: Secondary | ICD-10-CM | POA: Insufficient documentation

## 2013-07-23 DIAGNOSIS — I10 Essential (primary) hypertension: Secondary | ICD-10-CM

## 2013-07-23 DIAGNOSIS — Z0000 Encounter for general adult medical examination without abnormal findings: Secondary | ICD-10-CM

## 2013-07-23 DIAGNOSIS — I6529 Occlusion and stenosis of unspecified carotid artery: Secondary | ICD-10-CM

## 2013-07-23 DIAGNOSIS — F411 Generalized anxiety disorder: Secondary | ICD-10-CM

## 2013-07-23 LAB — LIPID PANEL
Cholesterol: 162 mg/dL (ref 0–200)
HDL: 71.1 mg/dL (ref >39.00)
LDL Cholesterol: 75 mg/dL (ref 0–99)
Total CHOL/HDL Ratio: 2
Triglycerides: 82 mg/dL (ref 0.0–149.0)
VLDL: 16.4 mg/dL (ref 0.0–40.0)

## 2013-07-23 LAB — CBC WITH DIFFERENTIAL/PLATELET
Basophils Absolute: 0 10*3/uL (ref 0.0–0.1)
Basophils Relative: 0.5 % (ref 0.0–3.0)
Eosinophils Absolute: 0.1 10*3/uL (ref 0.0–0.7)
Eosinophils Relative: 2.6 % (ref 0.0–5.0)
HCT: 37.4 % (ref 36.0–46.0)
Hemoglobin: 12.7 g/dL (ref 12.0–15.0)
Lymphocytes Relative: 18.2 % (ref 12.0–46.0)
Lymphs Abs: 0.7 10*3/uL (ref 0.7–4.0)
MCHC: 33.9 g/dL (ref 30.0–36.0)
MCV: 90.1 fl (ref 78.0–100.0)
Monocytes Absolute: 0.2 10*3/uL (ref 0.1–1.0)
Monocytes Relative: 5.8 % (ref 3.0–12.0)
Neutro Abs: 3 10*3/uL (ref 1.4–7.7)
Neutrophils Relative %: 72.9 % (ref 43.0–77.0)
Platelets: 346 10*3/uL (ref 150.0–400.0)
RBC: 4.15 Mil/uL (ref 3.87–5.11)
RDW: 13.4 % (ref 11.5–14.6)
WBC: 4.1 10*3/uL — ABNORMAL LOW (ref 4.5–10.5)

## 2013-07-23 LAB — COMPREHENSIVE METABOLIC PANEL
ALT: 19 U/L (ref 0–35)
AST: 30 U/L (ref 0–37)
Albumin: 4.3 g/dL (ref 3.5–5.2)
Alkaline Phosphatase: 69 U/L (ref 39–117)
BUN: 15 mg/dL (ref 6–23)
CO2: 29 mEq/L (ref 19–32)
Calcium: 10 mg/dL (ref 8.4–10.5)
Chloride: 97 mEq/L (ref 96–112)
Creatinine, Ser: 0.7 mg/dL (ref 0.4–1.2)
GFR: 89.81 mL/min (ref >60.00)
Glucose, Bld: 89 mg/dL (ref 70–99)
Potassium: 3.8 mEq/L (ref 3.5–5.1)
Sodium: 136 mEq/L (ref 135–145)
Total Bilirubin: 0.3 mg/dL (ref 0.3–1.2)
Total Protein: 7.5 g/dL (ref 6.0–8.3)

## 2013-07-23 LAB — TSH: TSH: 2.45 u[IU]/mL (ref 0.35–5.50)

## 2013-07-23 MED ORDER — POTASSIUM CHLORIDE CRYS ER 20 MEQ PO TBCR: 20.0000 meq | EXTENDED_RELEASE_TABLET | Freq: Every day | ORAL | Status: DC

## 2013-07-23 MED ORDER — FINASTERIDE 1 MG PO TABS: ORAL_TABLET | ORAL | Status: DC

## 2013-07-23 MED ORDER — AMLODIPINE BESY-BENAZEPRIL HCL 5-10 MG PO CAPS: 1.0000 | ORAL_CAPSULE | Freq: Every day | ORAL | Status: DC

## 2013-07-23 MED ORDER — RALOXIFENE HCL 60 MG PO TABS: ORAL_TABLET | ORAL | Status: DC

## 2013-07-23 MED ORDER — CLONAZEPAM 0.5 MG PO TABS: ORAL_TABLET | ORAL | Status: DC

## 2013-07-23 NOTE — Assessment & Plan Note (Addendum)
controlling cardiovascular risk factors, we discussed repeating a carotid ultrasound-- will arrange

## 2013-07-23 NOTE — Progress Notes (Signed)
Subjective:    Patient ID: Linda Jefferson, female    DOB: [birth year], 67 y.o.   MRN: [id number]  HPI Here for Medicare AWV: 1. Risk factors based on Past M, S, F history: reviewed 2. Physical Activities: very active  3. Depression/mood: neg screening  4. Hearing:  has hearing aids   5. ADL's: independent   6. Fall Risk: no recent falls  7. home Safety: does feel safe at home  8. Height, weight, & visual acuity: see VS, has glasses , sees the eye doctor regulalrly 9. Counseling: provided 10. Labs ordered based on risk factors: if needed  11. Referral Coordination: if needed 12. Care Plan, see assessment and plan  13. Cognitive Assessment: Motor skills and cognition appropriate  In addition, today we discussed the following: Hypertension, good medication compliance, ambulatory BPs in the 120s/70s High cholesterol, taking medications without problems. Anxiety, on clonazepam, symptoms well controlled Alopecia, good compliance with finasteride Osteopenia, on Evista  Past Medical History  Diagnosis Date  . 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 Normal cardiac stress test 04/2009  . Hypertension   . Carotid artery disease     per u/s 01/2009- repeated u/s 7/11 "mild dz, no stenosis"  . Hyperlipidemia   . LBP (low back pain) 3/11    after a local injection, f/u [facility]  . Osteopenia   . Wears hearing aid   . Seasonal allergies    Past Surgical History  Procedure Laterality Date  . Cholecystectomy    . Carpal tunnel release    . Trigger finger release  03/31/2012    Procedure: RELEASE TRIGGER FINGER/A-1 PULLEY;  Surgeon: Wyn Forster., MD;  Location: [facility];  Service: Orthopedics;  Laterality: Right;  right index  . Foot surgery  june 2014    B bunion    History   Social History  . Marital Status: Married    Spouse Name: N/A    Number of Children: 3  . Years of Education: N/A   Occupational History  . TEACHER-- retired    .   [employer]   Social History Main Topics  . Smoking status: Never Smoker   . Smokeless tobacco: Never Used  . Alcohol Use: Yes     Comment: socially   . Drug Use: No  . Sexual Activity: Not on file   Other Topics Concern  . Not on file   Social History Narrative                     Family History  Problem Relation Age of Onset  . Coronary artery disease Mother 71  . Hypertension      M family  . Stroke Mother   . Diabetes Mother   . Breast cancer Mother     M, 2 sisters, 1 aunt  . Colon cancer Neg Hx     Review of Systems No  CP, SOB, lower extremity edema Denies  nausea, vomiting diarrhea Denies  blood in the stools (-) wheezing, chest congestion No dysuria, gross hematuria, difficulty urinating       Objective:   Physical Exam BP 132/77  Pulse 80  Temp(Src) 98.3 F (36.8 C)  Ht 5' 4.2" (1.631 m)  Wt 126 lb 12.8 oz (57.516 kg)  BMI 21.62 kg/m2  SpO2 99% General -- alert, well-developed, NAD.  Neck --no thyromegaly , normal carotid pulse Breast-- no dominant mass, skin and nipples normal to  inspection on palpation, axillary areas without mass or lymphadenopathy Lungs -- normal respiratory effort, no intercostal retractions, no accessory muscle use, and normal breath sounds.  Heart-- normal rate, regular rhythm, no murmur.  Abdomen-- Not distended, good bowel sounds,soft, non-tender. Palpable non tender Ao Extremities-- no pretibial edema bilaterally  Neurologic--  alert & oriented X3. Speech normal, gait normal, strength normal in all extremities.  Psych-- Cognition and judgment appear intact. Cooperative with normal attention span and concentration. No anxious appearing , no depressed appearing.      Assessment & Plan:

## 2013-07-23 NOTE — Assessment & Plan Note (Signed)
Seems to be well controlled, continue with current meds, labs

## 2013-07-23 NOTE — Patient Instructions (Signed)
Get your blood work before you leave  Next visit in  6 months  for a check up    Fall Prevention and Home Safety Falls cause injuries and can affect all age groups. It is possible to use preventive measures to significantly decrease the likelihood of falls. There are many simple measures which can make your home safer and prevent falls. OUTDOORS  Repair cracks and edges of walkways and driveways.  Remove high doorway thresholds.  Trim shrubbery on the main path into your home.  Have good outside lighting.  Clear walkways of tools, rocks, debris, and clutter.  Check that handrails are not broken and are securely fastened. Both sides of steps should have handrails.  Have leaves, snow, and ice cleared regularly.  Use sand or salt on walkways during winter months.  In the garage, clean up grease or oil spills. BATHROOM  Install night lights.  Install grab bars by the toilet and in the tub and shower.  Use non-skid mats or decals in the tub or shower.  Place a plastic non-slip stool in the shower to sit on, if needed.  Keep floors dry and clean up all water on the floor immediately.  Remove soap buildup in the tub or shower on a regular basis.  Secure bath mats with non-slip, double-sided rug tape.  Remove throw rugs and tripping hazards from the floors. BEDROOMS  Install night lights.  Make sure a bedside light is easy to reach.  Do not use oversized bedding.  Keep a telephone by your bedside.  Have a firm chair with side arms to use for getting dressed.  Remove throw rugs and tripping hazards from the floor. KITCHEN  Keep handles on pots and pans turned toward the center of the stove. Use back burners when possible.  Clean up spills quickly and allow time for drying.  Avoid walking on wet floors.  Avoid hot utensils and knives.  Position shelves so they are not too high or low.  Place commonly used objects within easy reach.  If necessary, use a  sturdy step stool with a grab bar when reaching.  Keep electrical cables out of the way.  Do not use floor polish or wax that makes floors slippery. If you must use wax, use non-skid floor wax.  Remove throw rugs and tripping hazards from the floor. STAIRWAYS  Never leave objects on stairs.  Place handrails on both sides of stairways and use them. Fix any loose handrails. Make sure handrails on both sides of the stairways are as long as the stairs.  Check carpeting to make sure it is firmly attached along stairs. Make repairs to worn or loose carpet promptly.  Avoid placing throw rugs at the top or bottom of stairways, or properly secure the rug with carpet tape to prevent slippage. Get rid of throw rugs, if possible.  Have an electrician put in a light switch at the top and bottom of the stairs. OTHER FALL PREVENTION TIPS  Wear low-heel or rubber-soled shoes that are supportive and fit well. Wear closed toe shoes.  When using a stepladder, make sure it is fully opened and both spreaders are firmly locked. Do not climb a closed stepladder.  Add color or contrast paint or tape to grab bars and handrails in your home. Place contrasting color strips on first and last steps.  Learn and use mobility aids as needed. Install an electrical emergency response system.  Turn on lights to avoid dark areas. Replace light bulbs   that burn out immediately. Get light switches that glow.  Arrange furniture to create clear pathways. Keep furniture in the same place.  Firmly attach carpet with non-skid or double-sided tape.  Eliminate uneven floor surfaces.  Select a carpet pattern that does not visually hide the edge of steps.  Be aware of all pets. OTHER HOME SAFETY TIPS  Set the water temperature for 120 F (48.8 C).  Keep emergency numbers on or near the telephone.  Keep smoke detectors on every level of the home and near sleeping areas. Document Released: 09/13/2002 Document Revised:  03/24/2012 Document Reviewed: 12/13/2011 ExitCare Patient Information 2014 [employer].  

## 2013-07-23 NOTE — Assessment & Plan Note (Signed)
Good medication compliance, labs 

## 2013-07-23 NOTE — Assessment & Plan Note (Signed)
Last bone density test 04-2010, showed osteopenia, on calcium and vitamin D.  Has been on Evista for many years due to her family history of breast cancer  Plan: dexa

## 2013-07-23 NOTE — Assessment & Plan Note (Addendum)
Td 5-10 Flu shot--- declined , "never get them" pneumonia -Shingles immunization-- declined  diet and  Exercise-- doing great  Has not seen gyn since ~  2012, pt is low risk thus she is  hesitant to get further screening but request names of local providers [facility] breast ca, pt reports previous  genetic testing ; last MMG 10-2012  , breast exam (-) today  Colonoscopy:  04/18/2008, hemorrhoids. Next 2019 but GI rec iFOB starting 2012  ----> iFOB provided  Palpable Ao: [place] neg for AAA 6-10

## 2013-07-23 NOTE — Assessment & Plan Note (Signed)
On clonazepam for a while, symptoms well-controlled, needs a contract and a UDS

## 2013-07-24 ENCOUNTER — Encounter: Payer: Self-pay | Admitting: Internal Medicine

## 2013-07-27 ENCOUNTER — Encounter: Payer: Self-pay | Admitting: *Deleted

## 2013-07-28 ENCOUNTER — Other Ambulatory Visit: Payer: Self-pay | Admitting: Internal Medicine

## 2013-07-28 ENCOUNTER — Ambulatory Visit (HOSPITAL_BASED_OUTPATIENT_CLINIC_OR_DEPARTMENT_OTHER)
Admission: RE | Admit: 2013-07-28 | Discharge: 2013-07-28 | Disposition: A | Discharge status: Home / Self Care | Payer: Medicare HMO | Source: Ambulatory Visit | Attending: Internal Medicine | Admitting: Internal Medicine

## 2013-07-28 ENCOUNTER — Telehealth: Payer: Self-pay | Admitting: Internal Medicine

## 2013-07-28 DIAGNOSIS — I10 Essential (primary) hypertension: Secondary | ICD-10-CM | POA: Insufficient documentation

## 2013-07-28 DIAGNOSIS — I6529 Occlusion and stenosis of unspecified carotid artery: Secondary | ICD-10-CM | POA: Insufficient documentation

## 2013-07-28 DIAGNOSIS — E785 Hyperlipidemia, unspecified: Secondary | ICD-10-CM | POA: Insufficient documentation

## 2013-07-28 NOTE — Telephone Encounter (Signed)
Issue was resolved. Looks as though patient was already scheduled. Please disregard.

## 2013-07-28 NOTE — Telephone Encounter (Signed)
thx

## 2013-07-28 NOTE — Telephone Encounter (Signed)
Please advise.Ok to place order?

## 2013-07-28 NOTE — Telephone Encounter (Signed)
Patient states that she was supposed to have an order for an Ultrasound of her Carotid artery. She wanted to have this done the same day as her Bone density which has already been ordered. Unable to locate order for [place] in patient's chart. Please advise.

## 2013-07-30 ENCOUNTER — Ambulatory Visit
Admission: RE | Admit: 2013-07-30 | Discharge: 2013-07-30 | Disposition: A | Discharge status: Home / Self Care | Payer: Medicare HMO | Source: Ambulatory Visit | Attending: Internal Medicine | Admitting: Internal Medicine

## 2013-07-30 DIAGNOSIS — M899 Disorder of bone, unspecified: Secondary | ICD-10-CM

## 2013-08-12 ENCOUNTER — Other Ambulatory Visit: Payer: Self-pay

## 2013-08-28 ENCOUNTER — Other Ambulatory Visit: Payer: Self-pay | Admitting: Internal Medicine

## 2013-08-30 NOTE — Telephone Encounter (Signed)
rx refilled per protocol. DJR  

## 2013-09-25 ENCOUNTER — Other Ambulatory Visit: Payer: Self-pay | Admitting: Internal Medicine

## 2013-09-27 NOTE — Telephone Encounter (Signed)
rx refilled per protocol. DJR  

## 2013-10-19 ENCOUNTER — Other Ambulatory Visit: Payer: Self-pay | Admitting: Internal Medicine

## 2013-10-19 ENCOUNTER — Telehealth: Payer: Self-pay | Admitting: *Deleted

## 2013-10-19 MED ORDER — CLONAZEPAM 0.5 MG PO TABS: ORAL_TABLET | ORAL | Status: DC

## 2013-10-19 NOTE — Telephone Encounter (Signed)
Printed prescription for 90, no refills. I don't see a UDS in the chart, please print UDS if available. If not available, pt needs to come to the office and provide  a urine sample

## 2013-10-19 NOTE — Telephone Encounter (Signed)
rx refill- clonazepam 0.5mg  Last OV- 07/23/13 Last refilled- 07/23/13 #90 / 1 rf  Last UDS- 08/05/13 contract

## 2013-10-19 NOTE — Telephone Encounter (Deleted)
rx refill- clonazepam 0.5mg Last OV- 07/23/13 Last refilled- 07/23/13 #90 / 1 rf  Last UDS- 08/05/13 contract  

## 2013-10-19 NOTE — Addendum Note (Signed)
Addended by: Willow OraPAZ, Ayme Short E on: 10/19/2013 04:57 PM   Modules accepted: Orders

## 2013-10-21 NOTE — Telephone Encounter (Signed)
Addendum: UDS from 07/23/2013 negative for clonazepam, moderate risk, we'll request a UDS w/ next prescription

## 2013-10-28 ENCOUNTER — Encounter: Payer: Self-pay | Admitting: Internal Medicine

## 2013-11-30 ENCOUNTER — Ambulatory Visit: Payer: Medicare HMO | Attending: Orthopedic Surgery | Admitting: Physical Therapy

## 2013-11-30 DIAGNOSIS — M25519 Pain in unspecified shoulder: Secondary | ICD-10-CM | POA: Insufficient documentation

## 2013-11-30 DIAGNOSIS — M25619 Stiffness of unspecified shoulder, not elsewhere classified: Secondary | ICD-10-CM | POA: Insufficient documentation

## 2013-11-30 DIAGNOSIS — M6281 Muscle weakness (generalized): Secondary | ICD-10-CM | POA: Insufficient documentation

## 2013-11-30 DIAGNOSIS — IMO0001 Reserved for inherently not codable concepts without codable children: Secondary | ICD-10-CM | POA: Insufficient documentation

## 2013-12-06 ENCOUNTER — Ambulatory Visit: Payer: Medicare HMO | Attending: Orthopedic Surgery | Admitting: Rehabilitation

## 2013-12-06 DIAGNOSIS — IMO0001 Reserved for inherently not codable concepts without codable children: Secondary | ICD-10-CM | POA: Insufficient documentation

## 2013-12-06 DIAGNOSIS — M25619 Stiffness of unspecified shoulder, not elsewhere classified: Secondary | ICD-10-CM | POA: Insufficient documentation

## 2013-12-06 DIAGNOSIS — M6281 Muscle weakness (generalized): Secondary | ICD-10-CM | POA: Insufficient documentation

## 2013-12-06 DIAGNOSIS — M25519 Pain in unspecified shoulder: Secondary | ICD-10-CM | POA: Insufficient documentation

## 2013-12-13 ENCOUNTER — Other Ambulatory Visit: Payer: Self-pay | Admitting: Internal Medicine

## 2013-12-13 ENCOUNTER — Ambulatory Visit: Payer: Medicare HMO | Admitting: Physical Therapy

## 2013-12-14 ENCOUNTER — Telehealth: Payer: Self-pay | Admitting: *Deleted

## 2013-12-14 ENCOUNTER — Encounter: Payer: Self-pay | Admitting: Internal Medicine

## 2013-12-14 ENCOUNTER — Ambulatory Visit: Payer: Medicare HMO | Admitting: Physical Therapy

## 2013-12-14 MED ORDER — CLONAZEPAM 0.5 MG PO TABS: ORAL_TABLET | ORAL | Status: DC

## 2013-12-14 NOTE — Telephone Encounter (Signed)
rx refill- clonazepam 0.5mg  Last OV- 07/23/13 Last refilled- 10/19/13 #90 / 0 rf Last UDS_ 07/23/13 LOW risk

## 2013-12-14 NOTE — Addendum Note (Signed)
Addended by: Willow OraPAZ, Townes Fuhs E on: 12/14/2013 01:39 PM   Modules accepted: Orders

## 2013-12-14 NOTE — Telephone Encounter (Signed)
rx faxed to - [id number] - [place], [place] - [address] AT [address] FROM [address]

## 2013-12-14 NOTE — Telephone Encounter (Signed)
done

## 2014-01-11 ENCOUNTER — Encounter: Payer: Self-pay | Admitting: Internal Medicine

## 2014-01-12 ENCOUNTER — Telehealth: Payer: Self-pay | Admitting: Internal Medicine

## 2014-01-12 MED ORDER — VALACYCLOVIR HCL 1 G PO TABS: 1000.0000 mg | ORAL_TABLET | Freq: Three times a day (TID) | ORAL | Status: DC

## 2014-01-12 NOTE — Telephone Encounter (Signed)
Patient called and stated that she went to her eye doctor this morning to see Dr Katherina RightJicha. Dr Katherina RightJicha states that she is developing shingles in her eye and wanted her to start on Valtrex. Patient is needing a rx for Valtrex [facility] [address]. Please advise

## 2014-01-12 NOTE — Telephone Encounter (Signed)
Pt notified, scheduled office visit on April 24th. Pt states that she wanted Dr. Drue NovelPaz to know that her [facility] for [facility] and A-Fib.

## 2014-01-12 NOTE — Telephone Encounter (Signed)
Prescription sent. Advise patient to followup with her eye doctor closely as complications may develop. Routine office visit due this month, please make an appointment if patient agreeable

## 2014-01-12 NOTE — Telephone Encounter (Signed)
Thank you, I follow him from the computer

## 2014-01-28 ENCOUNTER — Ambulatory Visit: Payer: Medicare HMO | Admitting: Internal Medicine

## 2014-02-02 ENCOUNTER — Ambulatory Visit (INDEPENDENT_AMBULATORY_CARE_PROVIDER_SITE_OTHER): Payer: Medicare HMO | Admitting: Internal Medicine

## 2014-02-02 ENCOUNTER — Encounter: Payer: Self-pay | Admitting: Internal Medicine

## 2014-02-02 VITALS — BP 145/83 | HR 75 | Temp 98.1°F | Ht 63.8 in | Wt 125.0 lb

## 2014-02-02 DIAGNOSIS — B029 Zoster without complications: Secondary | ICD-10-CM

## 2014-02-02 DIAGNOSIS — M949 Disorder of cartilage, unspecified: Secondary | ICD-10-CM

## 2014-02-02 DIAGNOSIS — M899 Disorder of bone, unspecified: Secondary | ICD-10-CM

## 2014-02-02 DIAGNOSIS — F411 Generalized anxiety disorder: Secondary | ICD-10-CM

## 2014-02-02 MED ORDER — CLONAZEPAM 0.5 MG PO TABS: ORAL_TABLET | ORAL | Status: DC

## 2014-02-02 NOTE — Assessment & Plan Note (Signed)
Bone density test -- 2014, T score -1.7, slightly better than before. Continue with Evista, calcium and vitamin D

## 2014-02-02 NOTE — Assessment & Plan Note (Signed)
Diagnosed and treated for shingles of the right face and eye elsewhere 12-2013, first episode at same location many years ago, happened in the context of stress; completely recovered. We discussed what shingles is, we also talked about Zostavax. Plan: Observation for now

## 2014-02-02 NOTE — Assessment & Plan Note (Signed)
Husband was recently sick, slightly anxious, they both recently retired, lots of changes in their life, thinks would like to talk with a Veterinary surgeoncounselor. Plan-- Recommend to see one of our counselors, refill of clonazepam.

## 2014-02-02 NOTE — Progress Notes (Signed)
Pre visit review using our clinic review tool, if applicable. No additional management support is needed unless otherwise documented below in the visit note. 

## 2014-02-02 NOTE — Patient Instructions (Signed)
Raynelle FanningJulie and Aurther Lofterry are our counselors, you can get more info at the check out desk   Next visit is for a physical exam in 6 months,  fasting Please make an appointment

## 2014-02-02 NOTE — Progress Notes (Signed)
Subjective:    Patient ID: Linda Jefferson, female    DOB: 1945-10-19, 68 y.o.   MRN: [id number]  DOS:  02/02/2014 Type of  visit:  ROV In general doing well, labs reviewed, she is updated on all her routine labs. Good compliance with all medications. Blood pressures in the ambulatory setting around 132/70 Husband was recently sick, slightly anxious, they both recently retired, lots of changes in their life, thinks would like to talk with a Veterinary surgeoncounselor. Needs a refill of clonazepam. Diagnosed with shingles 3-15. Complete recovered  ROS  denies chest pain or difficulty breathing No nausea, vomiting, diarrhea  Past Medical History  Diagnosis Date  . 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 Normal cardiac stress test 04/2009  . Hypertension   . Carotid artery disease     per u/s 01/2009- repeated u/s 7/11 "mild dz, no stenosis"  . Hyperlipidemia   . LBP (low back pain) 3/11    after a local injection, [facility]  . Osteopenia   . Wears hearing aid   . Seasonal allergies     Past Surgical History  Procedure Laterality Date  . Cholecystectomy    . Carpal tunnel release    . Trigger finger release  03/31/2012    Procedure: RELEASE TRIGGER FINGER/A-1 PULLEY;  Surgeon: Wyn Forsterobert V Sypher Jr., MD;  Location: [facility];  Service: Orthopedics;  Laterality: Right;  right index  . Foot surgery  june 2014    B bunion     History   Social History  . Marital Status: Married    Spouse Name: N/A    Number of Children: 3  . Years of Education: N/A   Occupational History  . TEACHER-- retired    .  [employer]   Social History Main Topics  . Smoking status: Never Smoker   . Smokeless tobacco: Never Used  . Alcohol Use: Yes     Comment: socially   . Drug Use: No  . Sexual Activity: Not on file   Other Topics Concern  . Not on file   Social History Narrative                          Medication List       This list is  accurate as of: 02/02/14  6:30 PM.  Always use your most recent med list.               amLODipine-benazepril 5-10 MG per capsule  Commonly known as:  LOTREL  Take 1 capsule by mouth daily.     aspirin 325 MG tablet  Take 325 mg by mouth daily.     cetirizine 10 MG tablet  Commonly known as:  ZYRTEC  Take 10 mg by mouth daily.     clonazePAM 0.5 MG tablet  Commonly known as:  KLONOPIN  TAKE 1 TABLET BY MOUTH 3 TIMES A DAY AS NEEDED     finasteride 1 MG tablet  Commonly known as:  PROPECIA  TAKE 1 TABLET BY MOUTH EVERY DAY     hydrochlorothiazide 12.5 MG capsule  Commonly known as:  MICROZIDE  TAKE 1 CAPSULE (12.5 MG TOTAL) BY MOUTH DAILY.     multivitamin capsule  Take 1 capsule by mouth daily.     NON FORMULARY  Take 1 capsule by mouth daily. [employer]     potassium chloride SA 20 MEQ tablet  Commonly known as:  KLOR-CON M20  Take 1 tablet (20 mEq total) by mouth daily.     raloxifene 60 MG tablet  Commonly known as:  EVISTA  TAKE 1 TABLET BY MOUTH EVERY DAY     simvastatin 20 MG tablet  Commonly known as:  ZOCOR  TAKE 1 TABLET BY MOUTH AT BEDTIME     valACYclovir 1000 MG tablet  Commonly known as:  VALTREX  Take 1 tablet (1,000 mg total) by mouth 3 (three) times daily.           Objective:   Physical Exam BP 145/83  Pulse 75  Temp(Src) 98.1 F (36.7 C)  Ht 5' 3.8" (1.621 m)  Wt 125 lb (56.7 kg)  BMI 21.58 kg/m2  SpO2 97% General -- alert, well-developed, NAD.   Lungs -- normal respiratory effort, no intercostal retractions, no accessory muscle use, and normal breath sounds.  Heart-- normal rate, regular rhythm, no murmur.  Extremities-- no pretibial edema bilaterally  Neurologic--  alert & oriented X3. Speech normal, gait normal, strength normal in all extremities.  Psych-- Cognition and judgment appear intact. Cooperative with normal attention span and concentration. No anxious or depressed appearing.      Assessment & Plan:

## 2014-02-09 ENCOUNTER — Telehealth: Payer: Self-pay | Admitting: Internal Medicine

## 2014-02-09 DIAGNOSIS — E876 Hypokalemia: Secondary | ICD-10-CM

## 2014-02-09 DIAGNOSIS — L659 Nonscarring hair loss, unspecified: Secondary | ICD-10-CM

## 2014-02-09 DIAGNOSIS — E785 Hyperlipidemia, unspecified: Secondary | ICD-10-CM

## 2014-02-09 DIAGNOSIS — I10 Essential (primary) hypertension: Secondary | ICD-10-CM

## 2014-02-09 MED ORDER — FINASTERIDE 1 MG PO TABS: ORAL_TABLET | ORAL | Status: DC

## 2014-02-09 MED ORDER — SIMVASTATIN 20 MG PO TABS: ORAL_TABLET | ORAL | Status: DC

## 2014-02-09 MED ORDER — HYDROCHLOROTHIAZIDE 12.5 MG PO CAPS: ORAL_CAPSULE | ORAL | Status: DC

## 2014-02-09 MED ORDER — AMLODIPINE BESY-BENAZEPRIL HCL 5-10 MG PO CAPS: 1.0000 | ORAL_CAPSULE | Freq: Every day | ORAL | Status: DC

## 2014-02-09 MED ORDER — POTASSIUM CHLORIDE CRYS ER 20 MEQ PO TBCR: 20.0000 meq | EXTENDED_RELEASE_TABLET | Freq: Every day | ORAL | Status: DC

## 2014-02-09 NOTE — Telephone Encounter (Signed)
[facility] the [facility] [address]  Amolodipine-Benazepril Simvastatin HCTZ Fiunasteride Klor-Con

## 2014-02-09 NOTE — Telephone Encounter (Signed)
Patient called stating she needs refills on the following medications: Amlodipine-benazepril  Simvastatin  HCTZ Finasteride  Chlor-con  [facility] [address] [facility] [place]

## 2014-02-14 ENCOUNTER — Other Ambulatory Visit: Payer: Self-pay | Admitting: Internal Medicine

## 2014-02-16 ENCOUNTER — Ambulatory Visit: Payer: Medicare HMO | Admitting: Internal Medicine

## 2014-03-04 ENCOUNTER — Telehealth: Payer: Self-pay | Admitting: Internal Medicine

## 2014-03-04 NOTE — Telephone Encounter (Addendum)
Had a dizzy spell this morning around 3:30 am while laying in bed.  The dizzy episode lasted for about 30 seconds and then went away.  She is currently no longer dizzy or lightheaded.  She has had these spells before approximately 5 years ago, was seen by a cardiologist, and was cleared.   Husband is a retired Development worker, community and suggested that she have an EKG done.   VS at 0830 this morning:  BP: 114/59, P: 71  Currently on the [place] diet---weight watcher points; drinking 60 oz of water per day; no alcoholic beverages Lost 2 lbs since Monday.   No n/v/d, shortness of breath, chest pain, weakness, bleeding, fever, and denies dizziness at this time.  Advice:  Appointment today with another provider was offered as well as Saturday clinic.  Patient stated that she would rather wait until Monday to see Dr. Drue Novel.  Appointment scheduled for Monday @ 1130 am with  Dr. Drue Novel per patient's request.  She was also advised to immediately go to ER or UC if she starts to experience severe dizziness, severe headache, palpitations, sob, bleeding, and/or weakness.  She stated understanding and agreed with plan.

## 2014-03-04 NOTE — Telephone Encounter (Signed)
thx

## 2014-03-04 NOTE — Telephone Encounter (Signed)
Caller name: Airianna Relation to pt: self  Call back [phone]   Reason for call:   Pt states that see woke up at 3:30 am this morning and was dizzy and light headed.  Pt states that she has been losing weight over the past year and she has had issues with low BP in the past.  Pt wants to know if she can come in for an EKG.  Pt does not think it's serious, but I want to make sure she is triaged.  Please contact pt.

## 2014-03-04 NOTE — Telephone Encounter (Signed)
Please call this pt °

## 2014-03-07 ENCOUNTER — Encounter: Payer: Self-pay | Admitting: Internal Medicine

## 2014-03-07 ENCOUNTER — Ambulatory Visit (INDEPENDENT_AMBULATORY_CARE_PROVIDER_SITE_OTHER): Payer: Medicare HMO | Admitting: Internal Medicine

## 2014-03-07 VITALS — BP 139/76 | HR 86 | Temp 98.0°F | Wt 124.0 lb

## 2014-03-07 DIAGNOSIS — R42 Dizziness and giddiness: Secondary | ICD-10-CM

## 2014-03-07 NOTE — Assessment & Plan Note (Addendum)
2 episodes of dizziness as described above, although etiology is not clear, she has no red flag symptoms. EKG today normal Had a carotid ultrasound 07-2013 :no major blockages. I talked w/  the patient that her husband about observation versus neurology referral, she elected observation. Knows to call me if symptoms increase or more frequent.

## 2014-03-07 NOTE — Progress Notes (Signed)
Subjective:    Patient ID: Linda MinersKathleen L Chakraborty, female    DOB: October 04, 1946, 68 y.o.   MRN: [id number]  DOS:  03/07/2014 Type of  Visit: acute, ref. dizziness  History: In the last  3 months had 2 episodes of dizziness,  First episode of dizziness last only a few seconds, no associated headache, motor deficits or slurred speech.,  happened when she was in the car, not related to standing up or moving her head. The other happened at night, 3 AM , 3 days ago. She was woke up by the dizziness, again sx  lasted a few seconds. No motor deficits or headaches. Slurred speech? I she did not attempt to talk, again it lasted a few seconds and she went back to sleep   ROS Denies chest pain, difficulty breathing.   Past Medical History  Diagnosis Date  . 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 Normal cardiac stress test 04/2009  . Hypertension   . Carotid artery disease     per u/s 01/2009- repeated u/s 7/11 "mild dz, no stenosis"  . Hyperlipidemia   . LBP (low back pain) 3/11    after a local injection, f[facility]  . Osteopenia   . Wears hearing aid   . Seasonal allergies     Past Surgical History  Procedure Laterality Date  . Cholecystectomy    . Carpal tunnel release    . Trigger finger release  03/31/2012    Procedure: RELEASE TRIGGER FINGER/A-1 PULLEY;  Surgeon: Wyn Forsterobert V Sypher Jr., MD;  Location: [facility];  Service: Orthopedics;  Laterality: Right;  right index  . Foot surgery  june 2014    B bunion     History   Social History  . Marital Status: Married    Spouse Name: N/A    Number of Children: 3  . Years of Education: N/A   Occupational History  . TEACHER-- retired    .  [employer]   Social History Main Topics  . Smoking status: Never Smoker   . Smokeless tobacco: Never Used  . Alcohol Use: Yes     Comment: socially   . Drug Use: No  . Sexual Activity: Not on file   Other Topics Concern  . Not on file   Social History  Narrative                          Medication List       This list is accurate as of: 03/07/14  1:06 PM.  Always use your most recent med list.               amLODipine-benazepril 5-10 MG per capsule  Commonly known as:  LOTREL  Take 1 capsule by mouth daily.     aspirin 325 MG tablet  Take 325 mg by mouth daily.     cetirizine 10 MG tablet  Commonly known as:  ZYRTEC  Take 10 mg by mouth daily.     clonazePAM 0.5 MG tablet  Commonly known as:  KLONOPIN  TAKE 1 TABLET BY MOUTH 3 TIMES A DAY AS NEEDED     finasteride 1 MG tablet  Commonly known as:  PROPECIA  TAKE 1 TABLET BY MOUTH EVERY DAY     hydrochlorothiazide 12.5 MG capsule  Commonly known as:  MICROZIDE  TAKE 1 CAPSULE (12.5 MG TOTAL) BY MOUTH DAILY.     multivitamin  capsule  Take 1 capsule by mouth daily.     NON FORMULARY  Take 1 capsule by mouth daily. [employer]     potassium chloride SA 20 MEQ tablet  Commonly known as:  KLOR-CON M20  Take 1 tablet (20 mEq total) by mouth daily.     raloxifene 60 MG tablet  Commonly known as:  EVISTA  TAKE 1 TABLET BY MOUTH EVERY DAY     simvastatin 20 MG tablet  Commonly known as:  ZOCOR  TAKE 1 TABLET BY MOUTH AT BEDTIME     valACYclovir 1000 MG tablet  Commonly known as:  VALTREX  Take 1 tablet (1,000 mg total) by mouth 3 (three) times daily.           Objective:   Physical Exam BP 139/76  Pulse 86  Temp(Src) 98 F (36.7 C)  Wt 124 lb (56.246 kg)  SpO2 99% General -- alert, well-developed, NAD.  Neck --normal carotid pulses  HEENT-- Not pale.  Lungs -- normal respiratory effort, no intercostal retractions, no accessory muscle use, and normal breath sounds.  Heart-- normal rate, regular rhythm, no murmur.  Extremities-- no pretibial edema bilaterally  Neurologic--  alert & oriented X3. Speech normal, gait appropriate for age, strength symmetric and appropriate for age.  DTRs Generalized areflexia, not a new finding. Romberg  absent. EOMI, PERLA   Psych-- Cognition and judgment appear intact. Cooperative with normal attention span and concentration. No anxious or depressed appearing.      Assessment & Plan:

## 2014-03-07 NOTE — Progress Notes (Signed)
Pre visit review using our clinic review tool, if applicable. No additional management support is needed unless otherwise documented below in the visit note. 

## 2014-03-07 NOTE — Patient Instructions (Addendum)
Call if dizziness increases or gets intense or frequent   Next visit by 07-2014 for a physical

## 2014-05-24 ENCOUNTER — Telehealth: Payer: Self-pay | Admitting: Internal Medicine

## 2014-05-24 NOTE — Telephone Encounter (Signed)
Caller name: Nicholos JohnsKathleen  Relation to pt: self  Call back number: [phone]   Reason for call: Pt requesting a referral for [facility]       Dr. Mikki SanteeSokun Bhatti phone # [phone] & fax number [phone]                              Appointment 05/24/14 at 8:30am.                             Due to pt having No-See-Ums                            [facility]

## 2014-06-03 ENCOUNTER — Other Ambulatory Visit: Payer: Self-pay | Admitting: Internal Medicine

## 2014-08-01 ENCOUNTER — Other Ambulatory Visit: Payer: Self-pay

## 2014-08-01 MED ORDER — RALOXIFENE HCL 60 MG PO TABS: ORAL_TABLET | ORAL | Status: DC

## 2014-08-03 ENCOUNTER — Ambulatory Visit (INDEPENDENT_AMBULATORY_CARE_PROVIDER_SITE_OTHER): Payer: Medicare HMO | Admitting: Internal Medicine

## 2014-08-03 ENCOUNTER — Encounter: Payer: Self-pay | Admitting: Internal Medicine

## 2014-08-03 ENCOUNTER — Other Ambulatory Visit: Payer: Self-pay

## 2014-08-03 VITALS — BP 128/64 | HR 71 | Temp 97.7°F | Ht 64.0 in | Wt 129.5 lb

## 2014-08-03 DIAGNOSIS — M8580 Other specified disorders of bone density and structure, unspecified site: Secondary | ICD-10-CM

## 2014-08-03 DIAGNOSIS — I739 Peripheral vascular disease, unspecified: Secondary | ICD-10-CM

## 2014-08-03 DIAGNOSIS — I10 Essential (primary) hypertension: Secondary | ICD-10-CM

## 2014-08-03 DIAGNOSIS — F419 Anxiety disorder, unspecified: Secondary | ICD-10-CM

## 2014-08-03 DIAGNOSIS — E876 Hypokalemia: Secondary | ICD-10-CM

## 2014-08-03 DIAGNOSIS — I779 Disorder of arteries and arterioles, unspecified: Secondary | ICD-10-CM

## 2014-08-03 DIAGNOSIS — Z0000 Encounter for general adult medical examination without abnormal findings: Secondary | ICD-10-CM

## 2014-08-03 DIAGNOSIS — L659 Nonscarring hair loss, unspecified: Secondary | ICD-10-CM

## 2014-08-03 DIAGNOSIS — R635 Abnormal weight gain: Secondary | ICD-10-CM

## 2014-08-03 DIAGNOSIS — E785 Hyperlipidemia, unspecified: Secondary | ICD-10-CM

## 2014-08-03 LAB — CBC WITH DIFFERENTIAL/PLATELET
Basophils Absolute: 0 10*3/uL (ref 0.0–0.1)
Basophils Relative: 0.6 % (ref 0.0–3.0)
Eosinophils Absolute: 0.1 10*3/uL (ref 0.0–0.7)
Eosinophils Relative: 2.6 % (ref 0.0–5.0)
HCT: 36.6 % (ref 36.0–46.0)
Hemoglobin: 12.4 g/dL (ref 12.0–15.0)
Lymphocytes Relative: 21.7 % (ref 12.0–46.0)
Lymphs Abs: 0.9 10*3/uL (ref 0.7–4.0)
MCHC: 33.8 g/dL (ref 30.0–36.0)
MCV: 89.4 fl (ref 78.0–100.0)
Monocytes Absolute: 0.3 10*3/uL (ref 0.1–1.0)
Monocytes Relative: 6.7 % (ref 3.0–12.0)
Neutro Abs: 2.7 10*3/uL (ref 1.4–7.7)
Neutrophils Relative %: 68.4 % (ref 43.0–77.0)
Platelets: 353 10*3/uL (ref 150.0–400.0)
RBC: 4.09 Mil/uL (ref 3.87–5.11)
RDW: 13.7 % (ref 11.5–15.5)
WBC: 4 10*3/uL (ref 4.0–10.5)

## 2014-08-03 LAB — LIPID PANEL
Cholesterol: 138 mg/dL (ref 0–200)
HDL: 69.6 mg/dL (ref >39.00)
LDL Cholesterol: 54 mg/dL (ref 0–99)
NonHDL: 68.4
Total CHOL/HDL Ratio: 2
Triglycerides: 72 mg/dL (ref 0.0–149.0)
VLDL: 14.4 mg/dL (ref 0.0–40.0)

## 2014-08-03 LAB — COMPREHENSIVE METABOLIC PANEL
ALT: 21 U/L (ref 0–35)
AST: 33 U/L (ref 0–37)
Albumin: 3.8 g/dL (ref 3.5–5.2)
Alkaline Phosphatase: 59 U/L (ref 39–117)
BUN: 15 mg/dL (ref 6–23)
CO2: 22 mEq/L (ref 19–32)
Calcium: 9.8 mg/dL (ref 8.4–10.5)
Chloride: 92 mEq/L — ABNORMAL LOW (ref 96–112)
Creatinine, Ser: 0.8 mg/dL (ref 0.4–1.2)
GFR: 77.73 mL/min (ref >60.00)
Glucose, Bld: 78 mg/dL (ref 70–99)
Potassium: 4.1 mEq/L (ref 3.5–5.1)
Sodium: 130 mEq/L — ABNORMAL LOW (ref 135–145)
Total Bilirubin: 0.6 mg/dL (ref 0.2–1.2)
Total Protein: 7.4 g/dL (ref 6.0–8.3)

## 2014-08-03 LAB — TSH: TSH: 0.7 u[IU]/mL (ref 0.35–4.50)

## 2014-08-03 MED ORDER — AMLODIPINE BESY-BENAZEPRIL HCL 5-10 MG PO CAPS
1.0000 | ORAL_CAPSULE | Freq: Every day | ORAL | Status: DC
Start: 2014-08-03 — End: 2015-03-15

## 2014-08-03 MED ORDER — FINASTERIDE 5 MG PO TABS: 5.0000 mg | ORAL_TABLET | Freq: Every day | ORAL | Status: DC

## 2014-08-03 MED ORDER — SIMVASTATIN 20 MG PO TABS: ORAL_TABLET | ORAL | Status: DC

## 2014-08-03 MED ORDER — HYDROCHLOROTHIAZIDE 12.5 MG PO CAPS: ORAL_CAPSULE | ORAL | Status: DC

## 2014-08-03 MED ORDER — CLONAZEPAM 0.5 MG PO TABS: ORAL_TABLET | ORAL | Status: DC

## 2014-08-03 MED ORDER — POTASSIUM CHLORIDE CRYS ER 20 MEQ PO TBCR: 20.0000 meq | EXTENDED_RELEASE_TABLET | Freq: Every day | ORAL | Status: DC

## 2014-08-03 NOTE — Assessment & Plan Note (Signed)
Symptoms well controlled with as needed clonazepam, due for a UDS

## 2014-08-03 NOTE — Patient Instructions (Addendum)
Get your blood work before you leave  We also need a UDS    Please come back to the office in 1 year  for a physical exam. Come back fasting       Preventive Care for Adults   Ages 65 years and over  Blood pressure check.** / Every 1 to 2 years.  Lipid and cholesterol check.** / Every 5 years beginning at age 69 years.  Lung cancer screening. / Every year if you are aged 55-80 years and have a 30-pack-year history of smoking and currently smoke or have quit within the past 15 years. Yearly screening is stopped once you have quit smoking for at least 15 years or develop a health problem that would prevent you from having lung cancer treatment.  Clinical breast exam.** / Every year after age 40 years.  BRCA-related cancer risk assessment.** / For women who have family members with a BRCA-related cancer (breast, ovarian, tubal, or peritoneal cancers).  Mammogram.** / Every year beginning at age 40 years and continuing for as long as you are in good health. Consult with your health care provider.  Pap test.** / Every 3 years starting at age 30 years through age 65 or 70 years with 3 consecutive normal Pap tests. Testing can be stopped between 65 and 70 years with 3 consecutive normal Pap tests and no abnormal Pap or HPV tests in the past 10 years.  HPV screening.** / Every 3 years from ages 30 years through ages 65 or 70 years with a history of 3 consecutive normal Pap tests. Testing can be stopped between 65 and 70 years with 3 consecutive normal Pap tests and no abnormal Pap or HPV tests in the past 10 years.  Fecal occult blood test (FOBT) of stool. / Every year beginning at age 50 years and continuing until age 75 years. You may not need to do this test if you get a colonoscopy every 10 years.  Flexible sigmoidoscopy or colonoscopy.** / Every 5 years for a flexible sigmoidoscopy or every 10 years for a colonoscopy beginning at age 50 years and continuing until age 75  years.  Hepatitis C blood test.** / For all people born from 1945 through 1965 and any individual with known risks for hepatitis C.  Osteoporosis screening.** / A one-time screening for women ages 65 years and over and women at risk for fractures or osteoporosis.  Skin self-exam. / Monthly.  Influenza vaccine. / Every year.  Tetanus, diphtheria, and acellular pertussis (Tdap/Td) vaccine.** / 1 dose of Td every 10 years.  Varicella vaccine.** / Consult your health care provider.  Zoster vaccine.** / 1 dose for adults aged 60 years or older.  Pneumococcal 13-valent conjugate (PCV13) vaccine.** / Consult your health care provider.  Pneumococcal polysaccharide (PPSV23) vaccine.** / 1 dose for all adults aged 65 years and older.  Meningococcal vaccine.** / Consult your health care provider.  Hepatitis A vaccine.** / Consult your health care provider.  Hepatitis B vaccine.** / Consult your health care provider.  Haemophilus influenzae type b (Hib) vaccine.** / Consult your health care provider. ** Family history and personal history of risk and conditions may change your health care provider's recommendations. Document Released: 11/19/2001 Document Revised: 02/07/2014 Document Reviewed: 02/18/2011 ExitCare Patient Information 2015 [employer]. This information is not intended to replace advice given to you by your health care provider. Make sure you discuss any questions you have with your health care provider.   Fall Prevention and Home Safety Falls   cause injuries and can affect all age groups. It is possible to use preventive measures to significantly decrease the likelihood of falls. There are many simple measures which can make your home safer and prevent falls. OUTDOORS  Repair cracks and edges of walkways and driveways.  Remove high doorway thresholds.  Trim shrubbery on the main path into your home.  Have good outside lighting.  Clear walkways of tools, rocks, debris,  and clutter.  Check that handrails are not broken and are securely fastened. Both sides of steps should have handrails.  Have leaves, snow, and ice cleared regularly.  Use sand or salt on walkways during winter months.  In the garage, clean up grease or oil spills. BATHROOM  Install night lights.  Install grab bars by the toilet and in the tub and shower.  Use non-skid mats or decals in the tub or shower.  Place a plastic non-slip stool in the shower to sit on, if needed.  Keep floors dry and clean up all water on the floor immediately.  Remove soap buildup in the tub or shower on a regular basis.  Secure bath mats with non-slip, double-sided rug tape.  Remove throw rugs and tripping hazards from the floors. BEDROOMS  Install night lights.  Make sure a bedside light is easy to reach.  Do not use oversized bedding.  Keep a telephone by your bedside.  Have a firm chair with side arms to use for getting dressed.  Remove throw rugs and tripping hazards from the floor. KITCHEN  Keep handles on pots and pans turned toward the center of the stove. Use back burners when possible.  Clean up spills quickly and allow time for drying.  Avoid walking on wet floors.  Avoid hot utensils and knives.  Position shelves so they are not too high or low.  Place commonly used objects within easy reach.  If necessary, use a sturdy step stool with a grab bar when reaching.  Keep electrical cables out of the way.  Do not use floor polish or wax that makes floors slippery. If you must use wax, use non-skid floor wax.  Remove throw rugs and tripping hazards from the floor. STAIRWAYS  Never leave objects on stairs.  Place handrails on both sides of stairways and use them. Fix any loose handrails. Make sure handrails on both sides of the stairways are as long as the stairs.  Check carpeting to make sure it is firmly attached along stairs. Make repairs to worn or loose carpet  promptly.  Avoid placing throw rugs at the top or bottom of stairways, or properly secure the rug with carpet tape to prevent slippage. Get rid of throw rugs, if possible.  Have an electrician put in a light switch at the top and bottom of the stairs. OTHER FALL PREVENTION TIPS  Wear low-heel or rubber-soled shoes that are supportive and fit well. Wear closed toe shoes.  When using a stepladder, make sure it is fully opened and both spreaders are firmly locked. Do not climb a closed stepladder.  Add color or contrast paint or tape to grab bars and handrails in your home. Place contrasting color strips on first and last steps.  Learn and use mobility aids as needed. Install an electrical emergency response system.  Turn on lights to avoid dark areas. Replace light bulbs that burn out immediately. Get light switches that glow.  Arrange furniture to create clear pathways. Keep furniture in the same place.  Firmly attach carpet with  non-skid or double-sided tape.  Eliminate uneven floor surfaces.  Select a carpet pattern that does not visually hide the edge of steps.  Be aware of all pets. OTHER HOME SAFETY TIPS  Set the water temperature for 120 F (48.8 C).  Keep emergency numbers on or near the telephone.  Keep smoke detectors on every level of the home and near sleeping areas. Document Released: 09/13/2002 Document Revised: 03/24/2012 Document Reviewed: 12/13/2011 ExitCare Patient Information 2015 ExitCare, LLC. This information is not intended to replace advice given to you by your health care provider. Make sure you discuss any questions you have with your health care provider.  

## 2014-08-03 NOTE — Assessment & Plan Note (Addendum)
Continue hydrochlorothiazide and Lotrel, well control, check a CMP

## 2014-08-03 NOTE — Assessment & Plan Note (Signed)
On propecia 5 mg, 1/2 po qd

## 2014-08-03 NOTE — Assessment & Plan Note (Signed)
Last ultrasound 07-2013, less than 50% blockage, stable compared to previous years. Plan: Control cardiovascular risk factors, recheck a carotid ultrasound 2016

## 2014-08-03 NOTE — Progress Notes (Signed)
Subjective:    Patient ID: Linda Jefferson, female    DOB: [birth year], 68 y.o.   MRN: [id number]  DOS:  08/03/2014 Type of visit - description :    Here for Medicare AWV:  1. Risk factors based on Past M, S, F history: reviewed  2. Physical Activities: gym x2/week, works part time   3. Depression/mood: neg screening  4. Hearing: has hearing aids  5. ADL's: independent  6. Fall Risk: no recent falls  7. home Safety: does feel safe at home  8. Height, weight, & visual acuity: see VS, has glasses , sees the eye doctor regulalrly  9. Counseling: provided  10. Labs ordered based on risk factors: if needed  11. Referral Coordination: if needed  12. Care Plan, see assessment and plan  13. Cognitive Assessment: Motor skills and cognition appropriate  14. Care team updated   In addition, today we discussed the following:  Hypertension, good medication compliance, ambulatory BPs in the 130/70 High cholesterol, on simvastatin, no apparent side effects Osteopenia, family history of breast cancer: On Evista and  a multivitamin Anxiety, on Klonopin as needed. Good control of symptoms Alopecia-- on propecia, needs a RF  ROS Denies chest pain or difficulty breathing No nausea, vomiting, diarrhea or blood in the stools. No cough wheezing No dysuria, gross hematuria or difficulty urinating  Past Medical History  Diagnosis Date  . 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 Normal cardiac stress test 04/2009  . Hypertension   . Carotid artery disease     per u/s 01/2009- repeated u/s 7/11 "mild dz, no stenosis"  . Hyperlipidemia   . LBP (low back pain) 3/11    [facility]  . Osteopenia   . Wears hearing aid   . Seasonal allergies     Past Surgical History  Procedure Laterality Date  . Cholecystectomy    . Carpal tunnel release    . Trigger finger release  03/31/2012    Procedure: RELEASE TRIGGER FINGER/A-1 PULLEY;  Surgeon: Wyn Forsterobert V Sypher  Jr., MD;  Location: [facility];  Service: Orthopedics;  Laterality: Right;  right index  . Foot surgery  june 2014    B bunion     History   Social History  . Marital Status: Married    Spouse Name: N/A    Number of Children: 3  . Years of Education: N/A   Occupational History  . TEACHER-- retired    .  [employer]   Social History Main Topics  . Smoking status: Never Smoker   . Smokeless tobacco: Never Used  . Alcohol Use: Yes     Comment: socially   . Drug Use: No  . Sexual Activity: Not on file   Other Topics Concern  . Not on file   Social History Narrative                       Family History  Problem Relation Age of Onset  . Coronary artery disease Mother 1750  . Hypertension Other     M family  . Stroke Mother   . Diabetes Mother   . Breast cancer Mother     M, 2 sisters, 1 aunt  . Colon cancer Neg Hx        Medication List       This list is accurate as of: 08/03/14  6:44 PM.  Always use  your most recent med list.               amLODipine-benazepril 5-10 MG per capsule  Commonly known as:  LOTREL  Take 1 capsule by mouth daily.     aspirin 325 MG tablet  Take 325 mg by mouth daily.     cetirizine 10 MG tablet  Commonly known as:  ZYRTEC  Take 10 mg by mouth daily.     clonazePAM 0.5 MG tablet  Commonly known as:  KLONOPIN  TAKE 1 TABLET BY MOUTH 3 TIMES A DAY AS NEEDED     finasteride 5 MG tablet  Commonly known as:  PROSCAR  Take 1 tablet (5 mg total) by mouth daily.     hydrochlorothiazide 12.5 MG capsule  Commonly known as:  MICROZIDE  TAKE 1 CAPSULE (12.5 MG TOTAL) BY MOUTH DAILY.     multivitamin capsule  Take 1 capsule by mouth daily.     potassium chloride SA 20 MEQ tablet  Commonly known as:  KLOR-CON M20  Take 1 tablet (20 mEq total) by mouth daily.     PROBIOTIC DAILY PO  Take 1 tablet by mouth daily.     raloxifene 60 MG tablet  Commonly known as:  EVISTA  TAKE 1 TABLET BY MOUTH  EVERY DAY     simvastatin 20 MG tablet  Commonly known as:  ZOCOR  TAKE 1 TABLET BY MOUTH AT BEDTIME           Objective:   Physical Exam BP 128/64  Pulse 71  Temp(Src) 97.7 F (36.5 C) (Oral)  Ht 5\' 4"  (1.626 m)  Wt 129 lb 8 oz (58.741 kg)  BMI 22.22 kg/m2  SpO2 97% General -- alert, well-developed, NAD.  Neck --no thyromegaly   HEENT-- Not pale.  Breast-- no dominant mass, skin and nipples normal to inspection on palpation, axillary areas without mass or lymphadenopathy Lungs -- normal respiratory effort, no intercostal retractions, no accessory muscle use, and normal breath sounds.  Heart-- normal rate, regular rhythm, no murmur.  Abdomen-- Not distended, good bowel sounds,soft, non-tender. Palpable Ao, no TTP Extremities-- no pretibial edema bilaterally  Neurologic--  alert & oriented X3. Speech normal, gait appropriate for age, strength symmetric and appropriate for age.  Psych-- Cognition and judgment appear intact. Cooperative with normal attention span and concentration. No anxious or depressed appearing.     Assessment & Plan:   Wt gain Also complained of occasional constipation fatigue and weight gain despite no change in her diet. Request a TSH which will be checked; encouraged to call if sx increase or severe.

## 2014-08-03 NOTE — Progress Notes (Signed)
Pre visit review using our clinic review tool, if applicable. No additional management support is needed unless otherwise documented below in the visit note. 

## 2014-08-03 NOTE — Assessment & Plan Note (Addendum)
See previous entries, stable. On a  multivitamin with calcium and vitamin D, reluctant to take more supplements

## 2014-08-03 NOTE — Assessment & Plan Note (Signed)
Continue simvastatin, check FLP 

## 2014-08-03 NOTE — Assessment & Plan Note (Addendum)
Td 5-10 Flu shot--- declined , "never get them" pneumonia -Shingles immunization-- declined  diet and  Exercise-- doing great  Has not seen gyn since ~  2012, request to see a female gynecologist---> referral [facility], pt reports previous  genetic testing ; last [facility] 10-2013, has a f/u schedule 10-2014  , breast exam (-) today  Colonoscopy:  04/18/2008, hemorrhoids. Next 2019 but GI rec iFOB starting 2012  ----> iFOB provided last year but not returned, a new kit provided   Palpable Ao: [place] neg for AAA 6-10

## 2014-08-16 ENCOUNTER — Telehealth: Payer: Self-pay

## 2014-08-16 NOTE — Telephone Encounter (Signed)
UDS: 08/05/2014  Positive for Klonopin   Low risk per Dr. Drue NovelPaz 08/16/2014

## 2014-08-20 ENCOUNTER — Other Ambulatory Visit: Payer: Self-pay | Admitting: Internal Medicine

## 2014-08-22 ENCOUNTER — Encounter: Payer: Self-pay | Admitting: Internal Medicine

## 2014-08-25 ENCOUNTER — Telehealth: Payer: Self-pay | Admitting: *Deleted

## 2014-08-25 NOTE — Telephone Encounter (Signed)
[facility] on 08/03/2014. [facility] at [phone]. Awaiting determination. JG//CMA

## 2014-10-27 DIAGNOSIS — T8489XA Other specified complication of internal orthopedic prosthetic devices, implants and grafts, initial encounter: Secondary | ICD-10-CM | POA: Diagnosis not present

## 2014-10-27 DIAGNOSIS — T8484XA Pain due to internal orthopedic prosthetic devices, implants and grafts, initial encounter: Secondary | ICD-10-CM | POA: Diagnosis not present

## 2014-11-18 LAB — HM MAMMOGRAPHY: HM Mammogram: NORMAL

## 2015-01-23 ENCOUNTER — Ambulatory Visit (INDEPENDENT_AMBULATORY_CARE_PROVIDER_SITE_OTHER): Payer: Medicare PPO | Admitting: Physician Assistant

## 2015-01-23 ENCOUNTER — Encounter: Payer: Self-pay | Admitting: Physician Assistant

## 2015-01-23 VITALS — BP 157/75 | HR 69 | Temp 98.4°F | Resp 16 | Ht 64.0 in | Wt 130.1 lb

## 2015-01-23 DIAGNOSIS — H65192 Other acute nonsuppurative otitis media, left ear: Secondary | ICD-10-CM | POA: Diagnosis not present

## 2015-01-23 MED ORDER — FLUTICASONE PROPIONATE 50 MCG/ACT NA SUSP: 2.0000 | Freq: Every day | NASAL | Status: DC

## 2015-01-23 NOTE — Patient Instructions (Addendum)
Please continue the Z-pack until completed. Start using Flonase daily as directed. Continue the Zyrtec. Use saline nasal spray.  Call if symptoms are not improving and I will switch antibiotic.  Otitis Media Otitis media is redness, soreness, and inflammation of the middle ear. Otitis media may be caused by allergies or, most commonly, by infection. Often it occurs as a complication of the common cold. SIGNS AND SYMPTOMS Symptoms of otitis media may include:  Earache.  Fever.  Ringing in your ear.  Headache.  Leakage of fluid from the ear. DIAGNOSIS To diagnose otitis media, your health care provider will examine your ear with an otoscope. This is an instrument that allows your health care provider to see into your ear in order to examine your eardrum. Your health care provider also will ask you questions about your symptoms. TREATMENT  Typically, otitis media resolves on its own within 3-5 days. Your health care provider may prescribe medicine to ease your symptoms of pain. If otitis media does not resolve within 5 days or is recurrent, your health care provider may prescribe antibiotic medicines if he or she suspects that a bacterial infection is the cause. HOME CARE INSTRUCTIONS   If you were prescribed an antibiotic medicine, finish it all even if you start to feel better.  Take medicines only as directed by your health care provider.  Keep all follow-up visits as directed by your health care provider. SEEK MEDICAL CARE IF:  You have otitis media only in one ear, or bleeding from your nose, or both.  You notice a lump on your neck.  You are not getting better in 3-5 days.  You feel worse instead of better. SEEK IMMEDIATE MEDICAL CARE IF:   You have pain that is not controlled with medicine.  You have swelling, redness, or pain around your ear or stiffness in your neck.  You notice that part of your face is paralyzed.  You notice that the bone behind your ear  (mastoid) is tender when you touch it. MAKE SURE YOU:   Understand these instructions.  Will watch your condition.  Will get help right away if you are not doing well or get worse. Document Released: 06/28/2004 Document Revised: 02/07/2014 Document Reviewed: 04/20/2013 [facility] Patient Information 2015 [employer] [place]. This information is not intended to replace advice given to you by your health care provider. Make sure you discuss any questions you have with your health care provider.

## 2015-01-23 NOTE — Assessment & Plan Note (Signed)
Continue Azithromycin.  Begin Flonase daily as directed. Continue Zyrtec. Supportive measures discussed.  Will change ABX if symptoms not improving over next few days.

## 2015-01-23 NOTE — Progress Notes (Signed)
Patient presents to clinic today c/o left ear pressure, ear pain and dizziness associated with fatigue and nasal congestion.  Denies fever, chills, chest pain or SOB.  Denies vision changes. States a family friend prescribed her a z-pack which she started yesterday.  Past Medical History  Diagnosis Date  . 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 Normal cardiac stress test 04/2009  . Hypertension   . Carotid artery disease     per u/s 01/2009- repeated u/s 7/11 "mild dz, no stenosis"  . Hyperlipidemia   . LBP (low back pain) 3/11    after a local injection, f/u [facility]  . Osteopenia   . Wears hearing aid   . Seasonal allergies     Current Outpatient Prescriptions on File Prior to Visit  Medication Sig Dispense Refill  . amLODipine-benazepril (LOTREL) 5-10 MG per capsule Take 1 capsule by mouth daily. 30 capsule 6  . aspirin 325 MG tablet Take 325 mg by mouth daily.      . cetirizine (ZYRTEC) 10 MG tablet Take 10 mg by mouth daily.    . clonazePAM (KLONOPIN) 0.5 MG tablet TAKE 1 TABLET BY MOUTH 3 TIMES A DAY AS NEEDED 90 tablet 4  . finasteride (PROSCAR) 5 MG tablet Take 1 tablet (5 mg total) by mouth daily. 30 tablet 6  . hydrochlorothiazide (MICROZIDE) 12.5 MG capsule TAKE 1 CAPSULE (12.5 MG TOTAL) BY MOUTH DAILY. 30 capsule 6  . Multiple Vitamin (MULTIVITAMIN) capsule Take 1 capsule by mouth daily.      . potassium chloride SA (KLOR-CON M20) 20 MEQ tablet Take 1 tablet (20 mEq total) by mouth daily. 30 tablet 6  . Probiotic Product (PROBIOTIC DAILY PO) Take 1 tablet by mouth daily.    . raloxifene (EVISTA) 60 MG tablet TAKE 1 TABLET BY MOUTH EVERY DAY 30 tablet 5  . simvastatin (ZOCOR) 20 MG tablet TAKE 1 TABLET BY MOUTH AT BEDTIME 30 tablet 6   No current facility-administered medications on file prior to visit.    Allergies  Allergen Reactions  . Shellfish Allergy Anaphylaxis  . Eggs Or Egg-Derived Products   . Pseudoeph-Doxylamine-Dm-Apap     Family  History  Problem Relation Age of Onset  . Coronary artery disease Mother 52  . Hypertension Other     M family  . Stroke Mother   . Diabetes Mother   . Breast cancer Mother     M, 2 sisters, 1 aunt  . Colon cancer Neg Hx     History   Social History  . Marital Status: Married    Spouse Name: N/A  . Number of Children: 3  . Years of Education: N/A   Occupational History  . TEACHER-- retired    .  [employer]   Social History Main Topics  . Smoking status: Never Smoker   . Smokeless tobacco: Never Used  . Alcohol Use: Yes     Comment: socially   . Drug Use: No  . Sexual Activity: Not on file   Other Topics Concern  . None   Social History Narrative                     Review of Systems - See HPI.  All other ROS are negative.  BP 157/75 mmHg  Pulse 69  Temp(Src) 98.4 F (36.9 C) (Oral)  Resp 16  Ht  (1.626 m)  Wt 130 lb 2 oz (59.024 kg)  BMI  22.32 kg/m2  SpO2 100%  Physical Exam  Constitutional: She is oriented to person, place, and time and well-developed, well-nourished, and in no distress.  HENT:  Head: Normocephalic and atraumatic.  Right Ear: Tympanic membrane, external ear and ear canal normal.  Left Ear: External ear and ear canal normal. Tympanic membrane is erythematous and bulging.  Nose: Nose normal.  Mouth/Throat: Uvula is midline, oropharynx is clear and moist and mucous membranes are normal.  Eyes: Conjunctivae are normal.  Neck: Neck supple.  Cardiovascular: Normal rate, regular rhythm, normal heart sounds and intact distal pulses.   Pulmonary/Chest: Effort normal and breath sounds normal. No respiratory distress. She has no wheezes. She has no rales. She exhibits no tenderness.  Neurological: She is alert and oriented to person, place, and time.  Skin: Skin is warm and dry. No rash noted.  Psychiatric: Affect normal.   Recent Results (from the past 2160 hour(s))  [facility] MAMMOGRAPHY     Status: None   Collection Time:  11/18/14 12:00 AM  Result Value Ref Range   [facility] Mammogram      Normal/Next in 1 year/[facility]    Assessment/Plan: Acute nonsuppurative otitis media of left ear Continue Azithromycin.  Begin Flonase daily as directed. Continue Zyrtec. Supportive measures discussed.  Will change ABX if symptoms not improving over next few days.

## 2015-01-23 NOTE — Progress Notes (Signed)
Pre visit review using our clinic review tool, if applicable. No additional management support is needed unless otherwise documented below in the visit note/SLS  

## 2015-01-25 NOTE — Telephone Encounter (Signed)
error:[id number] ° °

## 2015-01-26 ENCOUNTER — Other Ambulatory Visit: Payer: Self-pay | Admitting: Internal Medicine

## 2015-02-13 ENCOUNTER — Telehealth: Payer: Self-pay | Admitting: *Deleted

## 2015-02-13 NOTE — Telephone Encounter (Signed)
Prior authorization for finasteride initiated. Awaiting determination. JG//CMA

## 2015-02-14 ENCOUNTER — Encounter: Payer: Self-pay | Admitting: Physician Assistant

## 2015-02-14 ENCOUNTER — Ambulatory Visit (INDEPENDENT_AMBULATORY_CARE_PROVIDER_SITE_OTHER): Payer: Medicare PPO | Admitting: Physician Assistant

## 2015-02-14 VITALS — BP 110/70 | HR 59 | Temp 97.8°F | Wt 128.0 lb

## 2015-02-14 DIAGNOSIS — R058 Other specified cough: Secondary | ICD-10-CM

## 2015-02-14 DIAGNOSIS — R05 Cough: Secondary | ICD-10-CM

## 2015-02-14 MED ORDER — ALBUTEROL SULFATE HFA 108 (90 BASE) MCG/ACT IN AERS: 2.0000 | INHALATION_SPRAY | Freq: Four times a day (QID) | RESPIRATORY_TRACT | Status: DC | PRN

## 2015-02-14 MED ORDER — HYDROCOD POLST-CPM POLST ER 10-8 MG/5ML PO SUER: 5.0000 mL | Freq: Two times a day (BID) | ORAL | Status: DC | PRN

## 2015-02-14 NOTE — Assessment & Plan Note (Signed)
Exam unremarkable.  Symptoms consistent with allergic rhinitis. Switch zyrtec for allegra or claritin. Increase fluids.  Humidifier in bedroom.  Rx Tussionex. Rx Albuterol to use as directed if needed for any chest tightness.

## 2015-02-14 NOTE — Progress Notes (Signed)
Patient presents to clinic today c/o dry cough, sore throat, runny nose, sneezing, and fatigue x 6 days.  Endorses symptoms are stable.  Feels yesterday was worse than today.  Denies fever, chills, chest pain or SOB.  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   Past Medical History  Diagnosis Date  . 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 Normal cardiac stress test 04/2009  . Hypertension   . Carotid artery disease     per u/s 01/2009- repeated u/s 7/11 "mild dz, no stenosis"  . Hyperlipidemia   . LBP (low back pain) 3/11    after a [facility], [facility]  . Osteopenia   . Wears hearing aid   . Seasonal allergies     Current Outpatient Prescriptions on File Prior to Visit  Medication Sig Dispense Refill  . amLODipine-benazepril (LOTREL) 5-10 MG per capsule Take 1 capsule by mouth daily. 30 capsule 6  . aspirin 325 MG tablet Take 325 mg by mouth daily.      Marland Kitchen. azithromycin (ZITHROMAX) 250 MG tablet Take 250 mg by mouth as directed.    . cetirizine (ZYRTEC) 10 MG tablet Take 10 mg by mouth daily.    . clonazePAM (KLONOPIN) 0.5 MG tablet TAKE 1 TABLET BY MOUTH 3 TIMES A DAY AS NEEDED 90 tablet 4  . finasteride (PROSCAR) 5 MG tablet Take 1 tablet (5 mg total) by mouth daily. 30 tablet 6  . fluticasone (FLONASE) 50 MCG/ACT nasal spray Place 2 sprays into both nostrils daily. 16 g 6  . hydrochlorothiazide (MICROZIDE) 12.5 MG capsule TAKE 1 CAPSULE (12.5 MG TOTAL) BY MOUTH DAILY. 30 capsule 6  . Multiple Vitamin (MULTIVITAMIN) capsule Take 1 capsule by mouth daily.      . potassium chloride SA (KLOR-CON M20) 20 MEQ tablet Take 1 tablet (20 mEq total) by mouth daily. 30 tablet 6  . Probiotic Product (PROBIOTIC DAILY PO) Take 1 tablet by mouth daily.    . raloxifene (EVISTA) 60 MG tablet Take 1 tablet (60 mg total) by mouth daily. 30 tablet 6  . simvastatin (ZOCOR) 20 MG tablet TAKE 1 TABLET BY MOUTH AT BEDTIME 30 tablet 6   No current  facility-administered medications on file prior to visit.    Allergies  Allergen Reactions  . Shellfish Allergy Anaphylaxis  . Eggs Or Egg-Derived Products   . Pseudoeph-Doxylamine-Dm-Apap     High blood pressure    Family History  Problem Relation Age of Onset  . Coronary artery disease Mother 5750  . Hypertension Other     M family  . Stroke Mother   . Diabetes Mother   . Breast cancer Mother     M, 2 sisters, 1 aunt  . Colon cancer Neg Hx     History   Social History  . Marital Status: Married    Spouse Name: N/A  . Number of Children: 3  . Years of Education: N/A   Occupational History  . TEACHER-- retired    .  [employer]   Social History Main Topics  . Smoking status: Never Smoker   . Smokeless tobacco: Never Used  . Alcohol Use: Yes     Comment: socially   . Drug Use: No  . Sexual Activity: Not on file   Other Topics Concern  . None   Social History Narrative  Review of Systems - See HPI.  All other ROS are negative.  BP 110/70 mmHg  Pulse 59  Temp(Src) 97.8 F (36.6 C)  Wt 128 lb (58.06 kg)  SpO2 97%  Physical Exam  Constitutional: She is oriented to person, place, and time and well-developed, well-nourished, and in no distress.  HENT:  Head: Normocephalic and atraumatic.  Right Ear: External ear normal.  Left Ear: External ear normal.  Nose: Nose normal.  Mouth/Throat: Oropharynx is clear and moist.  TM within normal limits bilaterally.  Eyes: Conjunctivae are normal.  Neck: Neck supple.  Cardiovascular: Normal rate, regular rhythm, normal heart sounds and intact distal pulses.   Pulmonary/Chest: Effort normal and breath sounds normal. No respiratory distress. She has no wheezes. She has no rales. She exhibits no tenderness.  Lymphadenopathy:    She has no cervical adenopathy.  Neurological: She is alert and oriented to person, place, and time.  Skin: Skin is warm and dry. No rash noted.    Psychiatric: Affect normal.  Vitals reviewed.   Recent Results (from the past 2160 hour(s))  HM MAMMOGRAPHY     Status: None   Collection Time: 11/18/14 12:00 AM  Result Value Ref Range   HM Mammogram      Normal/Next in 1 year/[facility]    Assessment/Plan: Allergic cough Exam unremarkable.  Symptoms consistent with allergic rhinitis. Switch zyrtec for allegra or claritin. Increase fluids.  Humidifier in bedroom.  Rx Tussionex. Rx Albuterol to use as directed if needed for any chest tightness.

## 2015-02-14 NOTE — Patient Instructions (Addendum)
Stay well hydrated. Continue Flonase. Switch your Zyrtec for Claritin or Allegra. Use Tussionex as directed for cough.   Try to use albuterol inhaler every 6 hours as needed over the next few days to calm down cough and chest tightness.  Follow-up if symptoms not improving.

## 2015-02-14 NOTE — Progress Notes (Signed)
Pre visit review using our clinic review tool, if applicable. No additional management support is needed unless otherwise documented below in the visit note. 

## 2015-02-15 NOTE — Telephone Encounter (Signed)
PA denied. Appeal started.  

## 2015-02-20 ENCOUNTER — Telehealth: Payer: Self-pay | Admitting: Internal Medicine

## 2015-02-20 NOTE — Telephone Encounter (Signed)
Have you received anything for this Pt, like a PA or insurance denial for Proscar 5 mg?

## 2015-02-20 NOTE — Telephone Encounter (Signed)
Caller: [name], self Ph#: [phone]  Pt called because finasteride (PROSCAR) 5 MG tablet RX was not covered by insurance. She understands why it was denied as far as not being FDA approved for women. She is wondering if we have received any further information on it. She is very upset about potentially losing her hair again. She is requesting a phone call.

## 2015-02-21 NOTE — Telephone Encounter (Signed)
Please refer to phone note from 02/13/15. PA was denied and appeal was started. I have not heard anything back from insurance. I will call them today. JG//CMA

## 2015-02-23 NOTE — Telephone Encounter (Addendum)
Relation to pt: self  Call back number: [phone]   Reason for call:  Pt checking on the status of PA. Advised pt appeal was started awaiting determination.

## 2015-03-08 NOTE — Telephone Encounter (Addendum)
Notify patient of his insurance decision. Other options include minoxidil

## 2015-03-08 NOTE — Telephone Encounter (Signed)
Appeal denied. Insurance states this medication is not intended for use in female patients. No alternative given because there are no other medications to compare it to since pt is female.

## 2015-03-09 NOTE — Telephone Encounter (Signed)
Pt stated she will try this medication.

## 2015-03-10 MED ORDER — MINOXIDIL 2 % EX SOLN: Freq: Two times a day (BID) | CUTANEOUS | Status: DC

## 2015-03-10 NOTE — Telephone Encounter (Signed)
rx for topical minoxidil sent

## 2015-03-10 NOTE — Addendum Note (Signed)
Addended by: Willow OraPAZ, JOSE E on: 03/10/2015 08:05 AM   Modules accepted: Orders, Medications

## 2015-03-15 ENCOUNTER — Other Ambulatory Visit: Payer: Self-pay | Admitting: Internal Medicine

## 2015-03-20 ENCOUNTER — Encounter: Payer: Self-pay | Admitting: Internal Medicine

## 2015-03-20 ENCOUNTER — Ambulatory Visit (INDEPENDENT_AMBULATORY_CARE_PROVIDER_SITE_OTHER): Payer: Medicare PPO | Admitting: Internal Medicine

## 2015-03-20 VITALS — BP 116/68 | HR 68 | Temp 97.9°F | Ht 64.0 in | Wt 126.0 lb

## 2015-03-20 DIAGNOSIS — L659 Nonscarring hair loss, unspecified: Secondary | ICD-10-CM

## 2015-03-20 DIAGNOSIS — I10 Essential (primary) hypertension: Secondary | ICD-10-CM | POA: Diagnosis not present

## 2015-03-20 DIAGNOSIS — E785 Hyperlipidemia, unspecified: Secondary | ICD-10-CM | POA: Diagnosis not present

## 2015-03-20 DIAGNOSIS — F419 Anxiety disorder, unspecified: Secondary | ICD-10-CM | POA: Diagnosis not present

## 2015-03-20 MED ORDER — HYDROCHLOROTHIAZIDE 12.5 MG PO CAPS: 12.5000 mg | ORAL_CAPSULE | Freq: Every day | ORAL | Status: DC

## 2015-03-20 MED ORDER — CLONAZEPAM 0.5 MG PO TABS: ORAL_TABLET | ORAL | Status: DC

## 2015-03-20 MED ORDER — FINASTERIDE 5 MG PO TABS: 5.0000 mg | ORAL_TABLET | Freq: Every day | ORAL | Status: DC

## 2015-03-20 NOTE — Progress Notes (Signed)
Subjective:    Patient ID: Linda Jefferson, female    DOB: 05/23/1946, 69 y.o.   MRN: [id number]  DOS:  03/20/2015 Type of visit - description : Routine office visit Interval history: Anxiety, well controlled with clonazepam, needs a refill High cholesterol, wonders if she "really needs to keep taking statins". Alopecia, his insurance denied the appeal for Propecia, likes to pay out of pocket.    Review of Systems  No concerns today  Past Medical History  Diagnosis Date  . 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 Normal cardiac stress test 04/2009  . Hypertension   . Carotid artery disease     per u/s 01/2009- repeated u/s 7/11 "mild dz, no stenosis"  . Hyperlipidemia   . LBP (low back pain) 3/11    after a local injection, f/u [facility]  . Osteopenia   . Wears hearing aid   . Seasonal allergies     Past Surgical History  Procedure Laterality Date  . Cholecystectomy    . Carpal tunnel release    . Trigger finger release  03/31/2012    Procedure: RELEASE TRIGGER FINGER/A-1 PULLEY;  Surgeon: Wyn Forster., MD;  Location: [facility];  Service: Orthopedics;  Laterality: Right;  right index  . Foot surgery  june 2014    B bunion     History   Social History  . Marital Status: Married    Spouse Name: N/A  . Number of Children: 3  . Years of Education: N/A   Occupational History  . TEACHER-- retired    .  [employer]   Social History Main Topics  . Smoking status: Never Smoker   . Smokeless tobacco: Never Used  . Alcohol Use: Yes     Comment: socially   . Drug Use: No  . Sexual Activity: Not on file   Other Topics Concern  . Not on file   Social History Narrative                          Medication List       This list is accurate as of: 03/20/15  5:57 PM.  Always use your most recent med list.               albuterol 108 (90 BASE) MCG/ACT inhaler  Commonly known as:  PROVENTIL HFA;VENTOLIN HFA    Inhale 2 puffs into the lungs every 6 (six) hours as needed for wheezing or shortness of breath.     amLODipine-benazepril 5-10 MG per capsule  Commonly known as:  LOTREL  Take 1 capsule by mouth daily.     aspirin 325 MG tablet  Take 325 mg by mouth daily.     cetirizine 10 MG tablet  Commonly known as:  ZYRTEC  Take 10 mg by mouth daily.     clonazePAM 0.5 MG tablet  Commonly known as:  KLONOPIN  TAKE 1 TABLET BY MOUTH 3 TIMES A DAY AS NEEDED     finasteride 5 MG tablet  Commonly known as:  PROSCAR  Take 1 tablet (5 mg total) by mouth daily.     fluticasone 50 MCG/ACT nasal spray  Commonly known as:  FLONASE  Place 2 sprays into both nostrils daily.     hydrochlorothiazide 12.5 MG capsule  Commonly known as:  MICROZIDE  Take 1 capsule (12.5 mg total) by mouth daily.  multivitamin capsule  Take 1 capsule by mouth daily.     potassium chloride SA 20 MEQ tablet  Commonly known as:  KLOR-CON M20  Take 1 tablet (20 mEq total) by mouth daily.     PROBIOTIC DAILY PO  Take 1 tablet by mouth daily.     raloxifene 60 MG tablet  Commonly known as:  EVISTA  Take 1 tablet (60 mg total) by mouth daily.           Objective:   Physical Exam BP 116/68 mmHg  Pulse 68  Temp(Src) 97.9 F (36.6 C) (Oral)  Ht 5\' 4"  (1.626 m)  Wt 126 lb (57.153 kg)  BMI 21.62 kg/m2  SpO2 98% General:   Well developed, well nourished . NAD.  HEENT:  Normocephalic . Face symmetric, atraumatic Lungs:  CTA B Normal respiratory effort, no intercostal retractions, no accessory muscle use. Heart: RRR,  no murmur.  No pretibial edema bilaterally  Skin: Not pale. Not jaundice Neurologic:  alert & oriented X3.  Speech normal, gait appropriate for age and unassisted Psych--  Cognition and judgment appear intact.  Cooperative with normal attention span and concentration.  Behavior appropriate. No anxious or depressed appearing.       Assessment & Plan:

## 2015-03-20 NOTE — Progress Notes (Signed)
Pre visit review using our clinic review tool, if applicable. No additional management support is needed unless otherwise documented below in the visit note. 

## 2015-03-21 NOTE — Assessment & Plan Note (Signed)
Hypertension Good compliance with medications, no apparent side effects, last BMP satisfactory, refill medications.

## 2015-03-21 NOTE — Assessment & Plan Note (Signed)
Insurance denied the appeal for Propecia, we recommended Rogaine topical, patient does not like to use that and likes to get a new prescription for Propecia, will pay out of pocket.

## 2015-03-21 NOTE — Assessment & Plan Note (Signed)
Patient wonders if she really needs to take cholesterol medication, all available FLP's in the chart are very good, she has been on statins for  years consequently we don't have a baseline she however has carotid artery disease consequently I recommend to continue on the same cholesterol medication.

## 2015-03-21 NOTE — Assessment & Plan Note (Signed)
Anxiety, refill clonazepam

## 2015-04-03 ENCOUNTER — Other Ambulatory Visit: Payer: Self-pay

## 2015-04-13 DIAGNOSIS — M67912 Unspecified disorder of synovium and tendon, left shoulder: Secondary | ICD-10-CM | POA: Diagnosis not present

## 2015-04-13 DIAGNOSIS — M65312 Trigger thumb, left thumb: Secondary | ICD-10-CM | POA: Diagnosis not present

## 2015-04-13 DIAGNOSIS — M19212 Secondary osteoarthritis, left shoulder: Secondary | ICD-10-CM | POA: Diagnosis not present

## 2015-06-18 ENCOUNTER — Other Ambulatory Visit: Payer: Self-pay | Admitting: Internal Medicine

## 2015-07-13 ENCOUNTER — Other Ambulatory Visit: Payer: Self-pay

## 2015-07-15 ENCOUNTER — Other Ambulatory Visit: Payer: Self-pay | Admitting: Internal Medicine

## 2015-08-01 ENCOUNTER — Other Ambulatory Visit: Payer: Self-pay | Admitting: Internal Medicine

## 2015-08-02 ENCOUNTER — Encounter: Payer: Self-pay | Admitting: Internal Medicine

## 2015-08-02 ENCOUNTER — Ambulatory Visit (INDEPENDENT_AMBULATORY_CARE_PROVIDER_SITE_OTHER): Payer: Medicare PPO | Admitting: Internal Medicine

## 2015-08-02 VITALS — BP 150/72 | HR 64 | Temp 98.0°F | Resp 16 | Ht 64.0 in

## 2015-08-02 DIAGNOSIS — J302 Other seasonal allergic rhinitis: Secondary | ICD-10-CM | POA: Diagnosis not present

## 2015-08-02 DIAGNOSIS — Z91018 Allergy to other foods: Secondary | ICD-10-CM | POA: Insufficient documentation

## 2015-08-02 DIAGNOSIS — J3089 Other allergic rhinitis: Secondary | ICD-10-CM | POA: Insufficient documentation

## 2015-08-02 NOTE — Assessment & Plan Note (Signed)
   Currently well controlled. Continue as needed cetirizine. 

## 2015-08-02 NOTE — Assessment & Plan Note (Addendum)
   Continue strict avoidance of mushrooms, MSG, shellfish. Will check specific IgE to shellfish panel. If low or negative will consider open challenge.  Does not carry EpiPen due to cost. Has action plan, instructed on use.

## 2015-08-02 NOTE — Progress Notes (Signed)
08/02/2015  Linda Jefferson 08/21/1946 [id number]  Referring provider: Wanda PlumpJose E Paz, MD [address] [place], [place] [address]  Chief Complaint: Allergy Testing   Linda Jefferson is a 69 y.o. female who is being seen today in follow-up.   HPI Comments: Allergic rhinitis: Symptoms have been stable on Zyrtec. She has not had repeated infections requiring antibiotics.  Food allergy: Patient ate mushrooms 20 years ago and developed shortness of breath requiring treatment in the emergency room. MSG causes heart palpitations. Shrimp causes finger tingling and lip swelling. She has not had any accidental ingestions since her last visit. She does not carry an EpiPen due to cost. She has deferred testing in the past.    ROS: Per HPI unless specifically indicated below Review of Systems   Drug Allergies:  No Known Allergies  Medications: No current outpatient prescriptions on file prior to visit.   No current facility-administered medications on file prior to visit.    Physical Exam: BP 150/72 mmHg  Pulse 64  Temp(Src) 98 F (36.7 C) (Oral)  Resp 16  Ht 5\' 4"  (1.626 m)  Physical Exam  Constitutional: She appears well-developed and well-nourished. No distress.  HENT:  Right Ear: External ear normal.  Left Ear: External ear normal.  Nose: Nose normal.  Mouth/Throat: Oropharynx is clear and moist.  Eyes: Conjunctivae are normal. Right eye exhibits no discharge. Left eye exhibits no discharge.  Cardiovascular: Normal rate, regular rhythm and normal heart sounds.   No murmur heard. Pulmonary/Chest: Effort normal and breath sounds normal. No respiratory distress. She has no wheezes. She has no rales.  Abdominal: Soft. Bowel sounds are normal.  Musculoskeletal: She exhibits no edema.  Lymphadenopathy:    She has no cervical adenopathy.  Neurological: She is alert.  Skin: No rash noted.  Vitals reviewed.   Diagnostics:    Assessment and Plan:  Food  allergy  Continue strict avoidance of mushrooms, MSG, shellfish. Will check specific IgE to shellfish panel. If low or negative will consider open challenge.  Does not carry EpiPen due to cost. Has action plan, instructed on use.  Other seasonal allergic rhinitis  Currently well controlled. Continue as needed cetirizine.    Return in about 1 year (around 08/01/2016).  Thank you for the opportunity to care for this patient.  Please do not hesitate to contact me with questions.  [facility] [address] [place], [place] [address] [phone]

## 2015-08-02 NOTE — Patient Instructions (Signed)
Food allergy  Continue strict avoidance of mushrooms, MSG, shellfish. Will check specific IgE to shellfish panel. If low or negative will consider open challenge.  Does not carry EpiPen due to cost. Has action plan, instructed on use.  Other seasonal allergic rhinitis  Currently well controlled. Continue as needed cetirizine.

## 2015-08-09 ENCOUNTER — Telehealth: Payer: Self-pay | Admitting: Behavioral Health

## 2015-08-09 ENCOUNTER — Encounter: Payer: Self-pay | Admitting: Behavioral Health

## 2015-08-09 DIAGNOSIS — Z79899 Other long term (current) drug therapy: Secondary | ICD-10-CM | POA: Diagnosis not present

## 2015-08-09 NOTE — Telephone Encounter (Signed)
Pre-Visit Call completed with patient and chart updated.   Pre-Visit Info documented in Specialty Comments under [employer].    

## 2015-08-09 NOTE — Addendum Note (Signed)
Addended by: Harold BarbanBYRD, RONECIA E on: 08/09/2015 04:17 PM   Modules accepted: Medications

## 2015-08-09 NOTE — Telephone Encounter (Signed)
Unable to reach patient at time of Pre-Visit Call.  Left message for patient to return call when available.    

## 2015-08-10 ENCOUNTER — Encounter: Payer: Self-pay | Admitting: Internal Medicine

## 2015-08-10 ENCOUNTER — Ambulatory Visit (INDEPENDENT_AMBULATORY_CARE_PROVIDER_SITE_OTHER): Payer: Medicare PPO | Admitting: Internal Medicine

## 2015-08-10 VITALS — BP 122/74 | HR 63 | Temp 97.8°F | Ht 64.0 in | Wt 130.0 lb

## 2015-08-10 DIAGNOSIS — E785 Hyperlipidemia, unspecified: Secondary | ICD-10-CM

## 2015-08-10 DIAGNOSIS — Z09 Encounter for follow-up examination after completed treatment for conditions other than malignant neoplasm: Secondary | ICD-10-CM

## 2015-08-10 DIAGNOSIS — I10 Essential (primary) hypertension: Secondary | ICD-10-CM | POA: Diagnosis not present

## 2015-08-10 DIAGNOSIS — Z0000 Encounter for general adult medical examination without abnormal findings: Secondary | ICD-10-CM

## 2015-08-10 DIAGNOSIS — Z1159 Encounter for screening for other viral diseases: Secondary | ICD-10-CM | POA: Diagnosis not present

## 2015-08-10 DIAGNOSIS — Z1211 Encounter for screening for malignant neoplasm of colon: Secondary | ICD-10-CM

## 2015-08-10 DIAGNOSIS — Z91018 Allergy to other foods: Secondary | ICD-10-CM | POA: Diagnosis not present

## 2015-08-10 LAB — BASIC METABOLIC PANEL
BUN: 16 mg/dL (ref 6–23)
CO2: 31 mEq/L (ref 19–32)
Calcium: 10 mg/dL (ref 8.4–10.5)
Chloride: 97 mEq/L (ref 96–112)
Creatinine, Ser: 0.74 mg/dL (ref 0.40–1.20)
GFR: 82.35 mL/min (ref >60.00)
Glucose, Bld: 96 mg/dL (ref 70–99)
Potassium: 4 mEq/L (ref 3.5–5.1)
Sodium: 137 mEq/L (ref 135–145)

## 2015-08-10 LAB — LIPID PANEL
Cholesterol: 136 mg/dL (ref 0–200)
HDL: 75.1 mg/dL (ref >39.00)
LDL Cholesterol: 49 mg/dL (ref 0–99)
NonHDL: 61.26
Total CHOL/HDL Ratio: 2
Triglycerides: 61 mg/dL (ref 0.0–149.0)
VLDL: 12.2 mg/dL (ref 0.0–40.0)

## 2015-08-10 LAB — CBC WITH DIFFERENTIAL/PLATELET
Basophils Absolute: 0 10*3/uL (ref 0.0–0.1)
Basophils Relative: 0.5 % (ref 0.0–3.0)
Eosinophils Absolute: 0.2 10*3/uL (ref 0.0–0.7)
Eosinophils Relative: 3.8 % (ref 0.0–5.0)
HCT: 36.4 % (ref 36.0–46.0)
Hemoglobin: 12.2 g/dL (ref 12.0–15.0)
Lymphocytes Relative: 18.5 % (ref 12.0–46.0)
Lymphs Abs: 0.8 10*3/uL (ref 0.7–4.0)
MCHC: 33.4 g/dL (ref 30.0–36.0)
MCV: 90.8 fl (ref 78.0–100.0)
Monocytes Absolute: 0.3 10*3/uL (ref 0.1–1.0)
Monocytes Relative: 6.7 % (ref 3.0–12.0)
Neutro Abs: 3.2 10*3/uL (ref 1.4–7.7)
Neutrophils Relative %: 70.5 % (ref 43.0–77.0)
Platelets: 361 10*3/uL (ref 150.0–400.0)
RBC: 4.01 Mil/uL (ref 3.87–5.11)
RDW: 13.4 % (ref 11.5–15.5)
WBC: 4.5 10*3/uL (ref 4.0–10.5)

## 2015-08-10 LAB — AST: AST: 26 U/L (ref 0–37)

## 2015-08-10 LAB — ALT: ALT: 20 U/L (ref 0–35)

## 2015-08-10 MED ORDER — HYDROCHLOROTHIAZIDE 12.5 MG PO CAPS: 12.5000 mg | ORAL_CAPSULE | Freq: Every day | ORAL | Status: DC

## 2015-08-10 MED ORDER — FINASTERIDE 5 MG PO TABS: 5.0000 mg | ORAL_TABLET | Freq: Every day | ORAL | Status: DC

## 2015-08-10 MED ORDER — POTASSIUM CHLORIDE CRYS ER 20 MEQ PO TBCR: 20.0000 meq | EXTENDED_RELEASE_TABLET | Freq: Every day | ORAL | Status: DC

## 2015-08-10 MED ORDER — CLONAZEPAM 0.5 MG PO TABS: 0.5000 mg | ORAL_TABLET | Freq: Three times a day (TID) | ORAL | Status: DC | PRN

## 2015-08-10 MED ORDER — RALOXIFENE HCL 60 MG PO TABS: 60.0000 mg | ORAL_TABLET | Freq: Every day | ORAL | Status: DC

## 2015-08-10 NOTE — Progress Notes (Signed)
Subjective:    Patient ID: Linda MinersKathleen L Zirkle, female    DOB: 12-Mar-1946, 69 y.o.   MRN: [id number]  DOS:  08/10/2015   Type of visit - description :   Here for Medicare AWV:   1. Risk factors based on Past M, S, F history: reviewed   2. Physical Activities: gym x4/week, works part time    3. Depression/mood: neg screening   4. Hearing: has hearing aids   5. ADL's: independent   6. Fall Risk: no recent falls, prevention discussed   7. home Safety: does feel safe at home   8. Height, weight, & visual acuity: see VS, has glasses , sees the eye doctor regulalrly   9. Counseling: provided   10. Labs ordered based on risk factors: if needed   11. Referral Coordination: if needed   12. Care Plan, see assessment and plan   13. Cognitive Assessment: Motor skills and cognition appropriate   14. Care team updated  15. End of life care discussed, see AVS   In addition, today we discussed the following: Food allergies -- her allergist requested blood testing. We'll do HTN: Compliance of medication no apparent side effects. BP today is very good High cholesterol: on statins, due for labs   Review of Systems Constitutional: No fever. No chills. No unexplained wt changes. No unusual sweats  HEENT: No dental problems, no ear discharge, no facial swelling, no voice changes. No eye discharge, no eye  redness , no  intolerance to light   Respiratory: No wheezing , no  difficulty breathing. No cough , no mucus production  Cardiovascular: No CP, no leg swelling , no  Palpitations  GI: no nausea, no vomiting, no diarrhea , no  abdominal pain.  No blood in the stools. No dysphagia, no odynophagia    Endocrine: No polyphagia, no polyuria , no polydipsia  GU: No dysuria, gross hematuria, difficulty urinating. No urinary urgency, no frequency.  Musculoskeletal: No joint swellings or unusual aches or pains  Skin: No change in the color of the skin, palor , no  Rash  Allergic, immunologic: No  environmental allergies , ? food allergies: Saw an allergist due for blood test   Neurological: No dizziness no  syncope. No headaches. No diplopia, no slurred, no slurred speech, no motor deficits, no facial  Numbness  Hematological: No enlarged lymph nodes, no easy bruising , no unusual bleedings  Psychiatry: No suicidal ideas, no hallucinations, no beavior problems, no confusion.  No unusual/severe anxiety, no depression   Past Medical History  Diagnosis Date  . 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 Normal cardiac stress test 04/2009  . Hypertension   . Carotid artery disease (HCC)     per u/s 01/2009- repeated u/s 7/11 "mild dz, no stenosis"  . Hyperlipidemia   . LBP (low back pain) 3/11    after [facility] local [facility]  . Osteopenia   . Wears hearing aid   . Seasonal allergies     Past Surgical History  Procedure Laterality Date  . Cholecystectomy    . Carpal tunnel release    . Trigger finger release  03/31/2012    Procedure: RELEASE TRIGGER FINGER/A-1 PULLEY;  Surgeon: Wyn Forsterobert V Sypher Jr., MD;  Location: [facility];  Service: Orthopedics;  Laterality: Right;  right index  . Foot surgery  june 2014    B bunion     Social History   Social History  .  Marital Status: Married    Spouse Name: N/A  . Number of Children: 3  . Years of Education: N/A   Occupational History  . TEACHER-- retired    .  [employer]   Social History Main Topics  . Smoking status: Never Smoker   . Smokeless tobacco: Never Used  . Alcohol Use: Yes     Comment: socially   . Drug Use: No  . Sexual Activity: Not on file   Other Topics Concern  . Not on file   Social History Narrative   3 children, one in [place] (PhD)                    Family History  Problem Relation Age of Onset  . Coronary artery disease Mother 16  . Hypertension Other     M family  . Stroke Mother   . Diabetes Mother   . Breast cancer Mother     M,  2 sisters, 1 aunt  . Colon cancer Neg Hx       Medication List       This list is accurate as of: 08/10/15 11:59 PM.  Always use your most recent med list.               albuterol 108 (90 BASE) MCG/ACT inhaler  Commonly known as:  PROVENTIL HFA;VENTOLIN HFA  Inhale 2 puffs into the lungs every 6 (six) hours as needed for wheezing or shortness of breath.     amLODipine-benazepril 5-10 MG capsule  Commonly known as:  LOTREL  Take 1 capsule by mouth daily.     aspirin 325 MG tablet  Take 325 mg by mouth daily.     cetirizine 10 MG tablet  Commonly known as:  ZYRTEC  Take 10 mg by mouth daily.     clonazePAM 0.5 MG tablet  Commonly known as:  KLONOPIN  Take 1 tablet (0.5 mg total) by mouth 3 (three) times daily as needed for anxiety.     finasteride 5 MG tablet  Commonly known as:  PROSCAR  Take 1 tablet (5 mg total) by mouth daily.     fluticasone 50 MCG/ACT nasal spray  Commonly known as:  FLONASE  Place 2 sprays into both nostrils daily.     hydrochlorothiazide 12.5 MG capsule  Commonly known as:  MICROZIDE  Take 1 capsule (12.5 mg total) by mouth daily.     multivitamin capsule  Take 1 capsule by mouth daily.     potassium chloride SA 20 MEQ tablet  Commonly known as:  KLOR-CON M20  Take 1 tablet (20 mEq total) by mouth daily.     PROBIOTIC DAILY PO  Take 1 tablet by mouth daily.     raloxifene 60 MG tablet  Commonly known as:  EVISTA  Take 1 tablet (60 mg total) by mouth daily.     simvastatin 20 MG tablet  Commonly known as:  ZOCOR  Take 1 tablet (20 mg total) by mouth at bedtime.           Objective:   Physical Exam BP 122/74 mmHg  Pulse 63  Temp(Src) 97.8 F (36.6 C) (Oral)  Ht  (1.626 m)  Wt 130 lb (58.968 kg)  BMI 22.30 kg/m2  SpO2 99% General:   Well developed, well nourished . NAD.  HEENT:  Normocephalic . Face symmetric, atraumatic Neck: No thyromegaly, normal carotid pulses Lungs:  CTA B Normal respiratory effort, no  intercostal retractions,  no accessory muscle use. Heart: RRR,  no murmur.  no pretibial edema bilaterally  Abdomen:  Not distended, soft, non-tender. No rebound or rigidity. Palpable aorta, upper abdomen, no bruit  Skin: Not pale. Not jaundice Neurologic:  alert & oriented X3.  Speech normal, gait appropriate for age and unassisted Psych--  Cognition and judgment appear intact.  Cooperative with normal attention span and concentration.  Behavior appropriate. No anxious or depressed appearing.    Assessment & Plan:   Assessment> HTN Hyperlipidemia Osteopenia  Per dexa 2011,  2014: t score  - 1.7. On Evista d/t FB breast ca Anxiety-- on clonazepam prn Seasonal (and food?) allergies Alopecia on proscar long term  HOH, hearing aids H/o Syncope, 2010:   --Nl  stress test, Holter: Atrial tachycardia --Carotid ultrasound/2010,2011 , 2014 no significant stenosis +FH CAD ,mother age 42 + FH Breast ca- mother, sister x2, aunt --- on Evista    plan: HTN: Seems well-controlled, check a BMP and CBC Hyperlipidemia: Continue statins, check FLP, AST, ALT Anxiety, on clonazepam prn, check a UDS. RTC 6-8 months

## 2015-08-10 NOTE — Progress Notes (Signed)
Pre visit review using our clinic review tool, if applicable. No additional management support is needed unless otherwise documented below in the visit note. 

## 2015-08-10 NOTE — Assessment & Plan Note (Addendum)
Td 5-10 Flu shot, pneumonia -Shingles immunization-- declined all Cervix ca screening: married x 1, multiple normal and no abnormal PAP  in her 69s, 60s (per pt) >> no further PAPs unless pt so desires     Strong FH breast ca, pt reports previous  genetic testing ; last MMG 11-2014,   breast exam (-) last year  Colonoscopy:  04/18/2008, hemorrhoids. Next 2019 but GI rec iFOB starting 2012  ----> iFOB x 2 before but not returned to my knowledge  , provided another kit today  Palpable Ao: [place] neg for AAA 6-10  diet-exercise discussed

## 2015-08-10 NOTE — Patient Instructions (Signed)
Get your blood work before you leave   Please consider visit these websites for more information:  www.begintheconversation.org  theconversationproject.org  Next visit, no fasting, 6 to 8 months     Fall Prevention and Home Safety Falls cause injuries and can affect all age groups. It is possible to use preventive measures to significantly decrease the likelihood of falls. There are many simple measures which can make your home safer and prevent falls. OUTDOORS  Repair cracks and edges of walkways and driveways.  Remove high doorway thresholds.  Trim shrubbery on the main path into your home.  Have good outside lighting.  Clear walkways of tools, rocks, debris, and clutter.  Check that handrails are not broken and are securely fastened. Both sides of steps should have handrails.  Have leaves, snow, and ice cleared regularly.  Use sand or salt on walkways during winter months.  In the garage, clean up grease or oil spills. BATHROOM  Install night lights.  Install grab bars by the toilet and in the tub and shower.  Use non-skid mats or decals in the tub or shower.  Place a plastic non-slip stool in the shower to sit on, if needed.  Keep floors dry and clean up all water on the floor immediately.  Remove soap buildup in the tub or shower on a regular basis.  Secure bath mats with non-slip, double-sided rug tape.  Remove throw rugs and tripping hazards from the floors. BEDROOMS  Install night lights.  Make sure a bedside light is easy to reach.  Do not use oversized bedding.  Keep a telephone by your bedside.  Have a firm chair with side arms to use for getting dressed.  Remove throw rugs and tripping hazards from the floor. KITCHEN  Keep handles on pots and pans turned toward the center of the stove. Use back burners when possible.  Clean up spills quickly and allow time for drying.  Avoid walking on wet floors.  Avoid hot utensils and  knives.  Position shelves so they are not too high or low.  Place commonly used objects within easy reach.  If necessary, use a sturdy step stool with a grab bar when reaching.  Keep electrical cables out of the way.  Do not use floor polish or wax that makes floors slippery. If you must use wax, use non-skid floor wax.  Remove throw rugs and tripping hazards from the floor. STAIRWAYS  Never leave objects on stairs.  Place handrails on both sides of stairways and use them. Fix any loose handrails. Make sure handrails on both sides of the stairways are as long as the stairs.  Check carpeting to make sure it is firmly attached along stairs. Make repairs to worn or loose carpet promptly.  Avoid placing throw rugs at the top or bottom of stairways, or properly secure the rug with carpet tape to prevent slippage. Get rid of throw rugs, if possible.  Have an electrician put in a light switch at the top and bottom of the stairs. OTHER FALL PREVENTION TIPS  Wear low-heel or rubber-soled shoes that are supportive and fit well. Wear closed toe shoes.  When using a stepladder, make sure it is fully opened and both spreaders are firmly locked. Do not climb a closed stepladder.  Add color or contrast paint or tape to grab bars and handrails in your home. Place contrasting color strips on first and last steps.  Learn and use mobility aids as needed. Install an electrical emergency response  system.  Turn on lights to avoid dark areas. Replace light bulbs that burn out immediately. Get light switches that glow.  Arrange furniture to create clear pathways. Keep furniture in the same place.  Firmly attach carpet with non-skid or double-sided tape.  Eliminate uneven floor surfaces.  Select a carpet pattern that does not visually hide the edge of steps.  Be aware of all pets. OTHER HOME SAFETY TIPS  Set the water temperature for 120 F (48.8 C).  Keep emergency numbers on or near the  telephone.  Keep smoke detectors on every level of the home and near sleeping areas. Document Released: 09/13/2002 Document Revised: 03/24/2012 Document Reviewed: 12/13/2011 [facility] Patient Information 2015 [place], [place]. This information is not intended to replace advice given to you by your health care provider. Make sure you discuss any questions you have with your health care provider.   Preventive Care for Adults Ages 58 and over  Blood pressure check.** / Every 1 to 2 years.  Lipid and cholesterol check.**/ Every 5 years beginning at age 86.  Lung cancer screening. / Every year if you are aged 65-80 years and have a 30-pack-year history of smoking and currently smoke or have quit within the past 15 years. Yearly screening is stopped once you have quit smoking for at least 15 years or develop a health problem that would prevent you from having lung cancer treatment.  Fecal occult blood test (FOBT) of stool. / Every year beginning at age 31 and continuing until age 93. You may not have to do this test if you get a colonoscopy every 10 years.  Flexible sigmoidoscopy** or colonoscopy.** / Every 5 years for a flexible sigmoidoscopy or every 10 years for a colonoscopy beginning at age 67 and continuing until age 32.  Hepatitis C blood test.** / For all people born from [birth year] through 1966 and any individual with known risks for hepatitis C.  Abdominal aortic aneurysm (AAA) screening.** / A one-time screening for ages 34 to 73 years who are current or former smokers.  Skin self-exam. / Monthly.  Influenza vaccine. / Every year.  Tetanus, diphtheria, and acellular pertussis (Tdap/Td) vaccine.** / 1 dose of Td every 10 years.  Varicella vaccine.** / Consult your health care provider.  Zoster vaccine.** / 1 dose for adults aged 31 years or older.  Pneumococcal 13-valent conjugate (PCV13) vaccine.** / Consult your health care provider.  Pneumococcal polysaccharide (PPSV23) vaccine.**  / 1 dose for all adults aged 58 years and older.  Meningococcal vaccine.** / Consult your health care provider.  Hepatitis A vaccine.** / Consult your health care provider.  Hepatitis B vaccine.** / Consult your health care provider.  Haemophilus influenzae type b (Hib) vaccine.** / Consult your health care provider. **Family history and personal history of risk and conditions may change your health care provider's recommendations. Document Released: 11/19/2001 Document Revised: 09/28/2013 Document Reviewed: 02/18/2011 [facility] Patient Information 2015 [place], [place]. This information is not intended to replace advice given to you by your health care provider. Make sure you discuss any questions you have with your health care provider.

## 2015-08-11 LAB — HEPATITIS C ANTIBODY: HCV Ab: NEGATIVE

## 2015-08-12 DIAGNOSIS — Z09 Encounter for follow-up examination after completed treatment for conditions other than malignant neoplasm: Secondary | ICD-10-CM | POA: Insufficient documentation

## 2015-08-12 NOTE — Assessment & Plan Note (Signed)
HTN: Seems well-controlled, check a BMP and CBC Hyperlipidemia: Continue statins, check FLP, AST, ALT Anxiety, on clonazepam prn, check a UDS. RTC 6-8 months

## 2015-08-14 LAB — ALLERGEN PROFILE, SHELLFISH
Clam IgE: <0.1 kU/L
F023-IgE Crab: 0.16 kU/L — AB
F080-IgE Lobster: <0.1 kU/L
F290-IgE Oyster: <0.1 kU/L
Scallop IgE: <0.1 kU/L
Shrimp IgE: 0.31 kU/L — AB

## 2015-08-14 LAB — PLEASE NOTE

## 2015-08-15 ENCOUNTER — Encounter: Payer: Self-pay | Admitting: Internal Medicine

## 2015-08-16 ENCOUNTER — Telehealth: Payer: Self-pay | Admitting: Allergy

## 2015-08-16 ENCOUNTER — Other Ambulatory Visit (INDEPENDENT_AMBULATORY_CARE_PROVIDER_SITE_OTHER): Payer: Medicare PPO

## 2015-08-16 DIAGNOSIS — Z1211 Encounter for screening for malignant neoplasm of colon: Secondary | ICD-10-CM

## 2015-08-16 LAB — FECAL OCCULT BLOOD, IMMUNOCHEMICAL: Fecal Occult Bld: NEGATIVE

## 2015-08-16 NOTE — Telephone Encounter (Signed)
ERROR

## 2015-08-16 NOTE — Telephone Encounter (Signed)
PATIENT CALLED OFFICE BACK REGARDING LABS. INFORMED PT OF RESULTS AND TOLD HER IF SHE WOULD LIKE WE COULD DO A CHALLENGE FOR SHRIMP PER DR BHATTI. PT. DECLINED.

## 2015-08-17 ENCOUNTER — Telehealth: Payer: Self-pay

## 2015-08-17 NOTE — Telephone Encounter (Signed)
UDS: 08/09/2015  Positive for Klonopin   Low risk per Dr. Drue NovelPaz 08/17/2015

## 2015-08-25 DIAGNOSIS — L661 Lichen planopilaris: Secondary | ICD-10-CM | POA: Diagnosis not present

## 2015-08-25 DIAGNOSIS — L658 Other specified nonscarring hair loss: Secondary | ICD-10-CM | POA: Diagnosis not present

## 2015-08-27 ENCOUNTER — Other Ambulatory Visit: Payer: Self-pay | Admitting: Internal Medicine

## 2015-08-28 ENCOUNTER — Other Ambulatory Visit: Payer: Self-pay

## 2015-09-14 ENCOUNTER — Encounter: Payer: Self-pay | Admitting: Internal Medicine

## 2015-09-14 ENCOUNTER — Ambulatory Visit (INDEPENDENT_AMBULATORY_CARE_PROVIDER_SITE_OTHER): Payer: Medicare PPO | Admitting: Internal Medicine

## 2015-09-14 VITALS — BP 132/74 | HR 66 | Temp 98.0°F | Ht 64.0 in | Wt 134.0 lb

## 2015-09-14 DIAGNOSIS — I10 Essential (primary) hypertension: Secondary | ICD-10-CM | POA: Diagnosis not present

## 2015-09-14 DIAGNOSIS — Z09 Encounter for follow-up examination after completed treatment for conditions other than malignant neoplasm: Secondary | ICD-10-CM

## 2015-09-14 LAB — BASIC METABOLIC PANEL
BUN: 14 mg/dL (ref 6–23)
CO2: 29 mEq/L (ref 19–32)
Calcium: 9.7 mg/dL (ref 8.4–10.5)
Chloride: 97 mEq/L (ref 96–112)
Creatinine, Ser: 0.68 mg/dL (ref 0.40–1.20)
GFR: 90.77 mL/min (ref >60.00)
Glucose, Bld: 82 mg/dL (ref 70–99)
Potassium: 3.9 mEq/L (ref 3.5–5.1)
Sodium: 136 mEq/L (ref 135–145)

## 2015-09-14 NOTE — Patient Instructions (Signed)
Get your blood work before you leave     Continue checking your BP daily Be sure your blood pressure is between 110/65 and  145/85.  if it is consistently higher or lower, let me know   Next visit  for a  routine checkup in 6 months, nonfasting.   Please schedule an appointment at the front desk

## 2015-09-14 NOTE — Progress Notes (Signed)
Pre visit review using our clinic review tool, if applicable. No additional management support is needed unless otherwise documented below in the visit note. 

## 2015-09-14 NOTE — Progress Notes (Signed)
Subjective:    Patient ID: Linda Jefferson, female    DOB: Mar 05, 1946, 69 y.o.   MRN: [id number]  DOS:  09/14/2015 Type of visit - description : To discuss blood pressure Interval history: HT----Self discontinue hydrochlorothiazide and potassium supplements, states she was taking too many medications. Since 08/12/2015 when she stopped  those medicines has been checking her BP: In the morning is usually in the 130s. Slightly higher in the afternoon ~ 120 to 150, most readings however are satisfactory.   Review of Systems  No chest pain or difficulty breathing. No lower extremity edema. No leg cramps. Past Medical History  Diagnosis Date  . Dyslipidemia   . Anxiety   . Food allergy     mushrooms  . 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 Normal cardiac stress test 04/2009  . Hypertension   . Carotid artery disease (HCC)     per u/s 01/2009- repeated u/s 7/11 "mild dz, no stenosis"  . Hyperlipidemia   . LBP (low back pain) 3/11    after a local injection, f/u [facility]  . Osteopenia   . Wears hearing aid   . Seasonal allergies     Past Surgical History  Procedure Laterality Date  . Cholecystectomy    . Carpal tunnel release    . Trigger finger release  03/31/2012    Procedure: RELEASE TRIGGER FINGER/A-1 PULLEY;  Surgeon: Wyn Forster., MD;  Location: [facility];  Service: Orthopedics;  Laterality: Right;  right index  . Foot surgery  june 2014    B bunion     Social History   Social History  . Marital Status: Married    Spouse Name: N/A  . Number of Children: 3  . Years of Education: N/A   Occupational History  . TEACHER-- retired    .  [employer]   Social History Main Topics  . Smoking status: Never Smoker   . Smokeless tobacco: Never Used  . Alcohol Use: 1.2 oz/week    2 Glasses of wine per week     Comment: socially   . Drug Use: No  . Sexual Activity: Not on file   Other Topics Concern  . Not on file    Social History Narrative   ** Merged History Encounter **       3 children, one in [place] (PhD)             Medication List       This list is accurate as of: 09/14/15 11:59 PM.  Always use your most recent med list.               albuterol 108 (90 BASE) MCG/ACT inhaler  Commonly known as:  PROVENTIL HFA;VENTOLIN HFA  Inhale 2 puffs into the lungs every 6 (six) hours as needed for wheezing or shortness of breath.     amLODipine-benazepril 5-10 MG capsule  Commonly known as:  LOTREL  Take 1 capsule by mouth daily.     aspirin 325 MG tablet  Take 325 mg by mouth daily.     cetirizine 10 MG tablet  Commonly known as:  ZYRTEC  Take 10 mg by mouth daily.     clonazePAM 0.5 MG tablet  Commonly known as:  KLONOPIN  Take 1 tablet (0.5 mg total) by mouth 3 (three) times daily as needed for anxiety.     finasteride 5 MG tablet  Commonly known as:  PROSCAR  Take 1 tablet (5 mg total) by mouth daily.     fluticasone 50 MCG/ACT nasal spray  Commonly known as:  FLONASE  Place 2 sprays into both nostrils daily.     multivitamin capsule  Take 1 capsule by mouth daily.     PROBIOTIC DAILY PO  Take 1 tablet by mouth daily.     raloxifene 60 MG tablet  Commonly known as:  EVISTA  Take 1 tablet (60 mg total) by mouth daily.     simvastatin 20 MG tablet  Commonly known as:  ZOCOR  Take 1 tablet (20 mg total) by mouth at bedtime.           Objective:   Physical Exam BP 132/74 mmHg  Pulse 66  Temp(Src) 98 F (36.7 C) (Oral)  Ht 5\' 4"  (1.626 m)  Wt 134 lb (60.782 kg)  BMI 22.99 kg/m2  SpO2 99% General:   Well developed, well nourished . NAD.  HEENT:  Normocephalic . Face symmetric, atraumatic Lungs:  CTA B Normal respiratory effort, no intercostal retractions, no accessory muscle use. Heart: RRR,  no murmur.  No pretibial edema bilaterally  Skin: Not pale. Not jaundice Neurologic:  alert & oriented X3.  Speech normal, gait appropriate for age  and unassisted Psych--  Cognition and judgment appear intact.  Cooperative with normal attention span and concentration.  Behavior appropriate. No anxious or depressed appearing.      Assessment & Plan:   Assessment> HTN Hyperlipidemia Osteopenia  Per dexa 2011,  2014: t score  - 1.7. On Evista d/t FB breast ca Anxiety-- on clonazepam prn Seasonal (and food?) allergies Alopecia on proscar long term  HOH, hearing aids H/o Syncope, 2010:   --Nl  stress test, Holter: Atrial tachycardia --Carotid ultrasound/2010,2011 , 2014 no significant stenosis +FH CAD ,mother age 70 + FH Breast ca- mother, sister x2, aunt --- on Evista   Plan: HTN: Self DC hydrochlorothiazide, potassium 08/12/2015, BPs are satisfactory with occasional elevated BP. We'll continue with amlodipine, benazepril, check a BMP. Continue monitoring BPs, if needed increase amlodipine. RTC 6 months

## 2015-09-17 NOTE — Assessment & Plan Note (Signed)
HTN: Self DC hydrochlorothiazide, potassium 08/12/2015, BPs are satisfactory with occasional elevated BP. We'll continue with amlodipine, benazepril, check a BMP. Continue monitoring BPs, if needed increase amlodipine. RTC 6 months

## 2015-10-18 ENCOUNTER — Other Ambulatory Visit: Payer: Self-pay | Admitting: Internal Medicine

## 2015-11-22 ENCOUNTER — Encounter: Payer: Self-pay | Admitting: Internal Medicine

## 2015-11-22 LAB — HM MAMMOGRAPHY

## 2016-02-09 ENCOUNTER — Other Ambulatory Visit (HOSPITAL_BASED_OUTPATIENT_CLINIC_OR_DEPARTMENT_OTHER): Payer: Self-pay | Admitting: Orthopedic Surgery

## 2016-02-09 DIAGNOSIS — M75100 Unspecified rotator cuff tear or rupture of unspecified shoulder, not specified as traumatic: Secondary | ICD-10-CM

## 2016-02-17 ENCOUNTER — Ambulatory Visit (HOSPITAL_BASED_OUTPATIENT_CLINIC_OR_DEPARTMENT_OTHER)
Admission: RE | Admit: 2016-02-17 | Discharge: 2016-02-17 | Disposition: A | Discharge status: Home / Self Care | Payer: Medicare Other | Source: Ambulatory Visit | Attending: Orthopedic Surgery | Admitting: Orthopedic Surgery

## 2016-02-17 DIAGNOSIS — M25412 Effusion, left shoulder: Secondary | ICD-10-CM | POA: Diagnosis not present

## 2016-02-17 DIAGNOSIS — M24012 Loose body in left shoulder: Secondary | ICD-10-CM | POA: Diagnosis not present

## 2016-02-17 DIAGNOSIS — M19012 Primary osteoarthritis, left shoulder: Secondary | ICD-10-CM | POA: Insufficient documentation

## 2016-02-17 DIAGNOSIS — M25512 Pain in left shoulder: Secondary | ICD-10-CM | POA: Diagnosis present

## 2016-02-17 DIAGNOSIS — M75100 Unspecified rotator cuff tear or rupture of unspecified shoulder, not specified as traumatic: Secondary | ICD-10-CM | POA: Diagnosis not present

## 2016-02-17 DIAGNOSIS — M65812 Other synovitis and tenosynovitis, left shoulder: Secondary | ICD-10-CM | POA: Diagnosis not present

## 2016-03-04 ENCOUNTER — Other Ambulatory Visit: Payer: Self-pay | Admitting: Internal Medicine

## 2016-03-20 ENCOUNTER — Other Ambulatory Visit: Payer: Self-pay | Admitting: Internal Medicine

## 2016-04-01 ENCOUNTER — Other Ambulatory Visit: Payer: Self-pay | Admitting: Internal Medicine

## 2016-04-01 NOTE — Telephone Encounter (Signed)
Pt is requesting refill on Clonazepam.  Last OV: 09/14/2015, no appt scheduled Last Fill: 08/10/2015 #90 and 5RF Pt sig: 1 tab TID PRN UDS: 08/09/2015 Low risk  Please advise.

## 2016-04-01 NOTE — Telephone Encounter (Signed)
Rx printed, awaiting MD signature.  

## 2016-04-01 NOTE — Telephone Encounter (Signed)
Rx faxed to [facility] pharmacy.  

## 2016-04-01 NOTE — Telephone Encounter (Signed)
Okay #90, one refill, advise patient to schedule a follow-up

## 2016-04-06 HISTORY — PX: TOTAL SHOULDER ARTHROPLASTY: SHX126

## 2016-04-29 ENCOUNTER — Other Ambulatory Visit: Payer: Self-pay | Admitting: Internal Medicine

## 2016-05-29 ENCOUNTER — Other Ambulatory Visit: Payer: Self-pay | Admitting: Internal Medicine

## 2016-06-24 ENCOUNTER — Telehealth: Payer: Self-pay | Admitting: Internal Medicine

## 2016-06-24 ENCOUNTER — Other Ambulatory Visit: Payer: Self-pay | Admitting: Internal Medicine

## 2016-06-24 MED ORDER — CLONAZEPAM 0.5 MG PO TABS: 0.5000 mg | ORAL_TABLET | Freq: Three times a day (TID) | ORAL | 0 refills | Status: DC | PRN

## 2016-06-24 NOTE — Telephone Encounter (Signed)
Patient was here today at the office with her husband, request a refill clonazepam, #90 provided, needs office visit before next refills

## 2016-06-27 ENCOUNTER — Other Ambulatory Visit: Payer: Self-pay | Admitting: Internal Medicine

## 2016-07-05 ENCOUNTER — Encounter: Payer: Self-pay | Admitting: Internal Medicine

## 2016-07-05 ENCOUNTER — Ambulatory Visit (INDEPENDENT_AMBULATORY_CARE_PROVIDER_SITE_OTHER): Payer: Medicare Other | Admitting: Internal Medicine

## 2016-07-05 VITALS — BP 122/68 | HR 69 | Temp 97.9°F | Resp 12 | Ht 64.0 in | Wt 138.5 lb

## 2016-07-05 DIAGNOSIS — E785 Hyperlipidemia, unspecified: Secondary | ICD-10-CM | POA: Diagnosis not present

## 2016-07-05 DIAGNOSIS — I10 Essential (primary) hypertension: Secondary | ICD-10-CM | POA: Diagnosis not present

## 2016-07-05 DIAGNOSIS — F419 Anxiety disorder, unspecified: Secondary | ICD-10-CM

## 2016-07-05 MED ORDER — RALOXIFENE HCL 60 MG PO TABS: 60.0000 mg | ORAL_TABLET | Freq: Every day | ORAL | 5 refills | Status: DC

## 2016-07-05 MED ORDER — CLONAZEPAM 0.5 MG PO TABS: 0.5000 mg | ORAL_TABLET | Freq: Three times a day (TID) | ORAL | 3 refills | Status: DC | PRN

## 2016-07-05 MED ORDER — FINASTERIDE 5 MG PO TABS: 5.0000 mg | ORAL_TABLET | Freq: Every day | ORAL | 5 refills | Status: DC

## 2016-07-05 NOTE — Progress Notes (Signed)
Subjective:    Patient ID: Linda Jefferson, female    DOB: 1945/10/21, 70 y.o.   MRN: [id number]  DOS:  07/05/2016 Type of visit - description : Routine checkup Interval history: Anxiety, well controlled with clonazepam 3 times a day, needs a UDS HTN: Had a BMP recently, normal. Has gained weight lately in the last couple of weeks, she thinks because is taking prednisone for her shoulder pain.  Review of Systems Denies chest pain, difficulty breathing or lower extremity edema No nausea or vomiting  Past Medical History:  Diagnosis Date  . Anxiety   . Carotid artery disease (HCC)    per u/s 01/2009- repeated u/s 7/11 "mild dz, no stenosis"  . Dyslipidemia   . Food allergy    mushrooms  . Hyperlipidemia   . Hypertension   . LBP (low back pain) 3/11   after a local injection, f/u [facility]  . Normal cardiac stress test 04/2009  . Osteopenia   . Seasonal allergies   . 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 Wears hearing aid     Past Surgical History:  Procedure Laterality Date  . CARPAL TUNNEL RELEASE    . CHOLECYSTECTOMY    . FOOT SURGERY  june 2014   B bunion   . TRIGGER FINGER RELEASE  03/31/2012   Procedure: RELEASE TRIGGER FINGER/A-1 PULLEY;  Surgeon: Wyn Forster., MD;  Location: [facility];  Service: Orthopedics;  Laterality: Right;  right index    Social History   Social History  . Marital status: Married    Spouse name: N/A  . Number of children: 3  . Years of education: N/A   Occupational History  . TEACHER-- retired    .  [employer]   Social History Main Topics  . Smoking status: Never Smoker  . Smokeless tobacco: Never Used  . Alcohol use 1.2 oz/week    2 Glasses of wine per week     Comment: socially   . Drug use: No  . Sexual activity: Not on file   Other Topics Concern  . Not on file   Social History Narrative   ** Merged History Encounter **       3 children, one in [place]  (PhD)             Medication List       Accurate as of 07/05/16  1:27 PM. Always use your most recent med list.          albuterol 108 (90 Base) MCG/ACT inhaler Commonly known as:  PROVENTIL HFA;VENTOLIN HFA Inhale 2 puffs into the lungs every 6 (six) hours as needed for wheezing or shortness of breath.   amLODipine-benazepril 5-10 MG capsule Commonly known as:  LOTREL Take 1 capsule by mouth daily.   aspirin 325 MG tablet Take 325 mg by mouth daily.   cetirizine 10 MG tablet Commonly known as:  ZYRTEC Take 10 mg by mouth daily.   clonazePAM 0.5 MG tablet Commonly known as:  KLONOPIN Take 1 tablet (0.5 mg total) by mouth 3 (three) times daily as needed for anxiety.   diclofenac 75 MG EC tablet Commonly known as:  VOLTAREN Take 75 mg by mouth 2 (two) times daily.   finasteride 5 MG tablet Commonly known as:  PROSCAR Take 1 tablet (5 mg total) by mouth daily.   fluticasone 50 MCG/ACT nasal spray Commonly known as:  FLONASE Place 2 sprays into both  nostrils daily.   multivitamin capsule Take 1 capsule by mouth daily.   PROBIOTIC DAILY PO Take 1 tablet by mouth daily.   raloxifene 60 MG tablet Commonly known as:  EVISTA Take 1 tablet (60 mg total) by mouth daily.   simvastatin 20 MG tablet Commonly known as:  ZOCOR Take 1 tablet (20 mg total) by mouth at bedtime.          Objective:   Physical Exam BP 122/68 (BP Location: Right Arm, Patient Position: Sitting, Cuff Size: Normal)   Pulse 69   Temp 97.9 F (36.6 C) (Oral)   Resp 12   Ht 5\' 4"  (1.626 m)   Wt 138 lb 8 oz (62.8 kg)   SpO2 98%   BMI 23.77 kg/m  General:   Well developed, well nourished . NAD.  HEENT:  Normocephalic . Face symmetric, atraumatic Lungs:  CTA B Normal respiratory effort, no intercostal retractions, no accessory muscle use. Heart: RRR,  no murmur.  No pretibial edema bilaterally  Skin: Not pale. Not jaundice Neurologic:  alert & oriented X3.  Speech normal,  gait appropriate for age and unassisted Psych--  Cognition and judgment appear intact.  Cooperative with normal attention span and concentration.  Behavior appropriate. No anxious or depressed appearing.      Assessment & Plan:   Assessment> HTN Hyperlipidemia Osteopenia  Per dexa 2011,  2014: t score  - 1.7. On Evista d/t FB breast ca Anxiety-- on clonazepam prn Seasonal (and food?) allergies Alopecia on proscar long term  HOH, hearing aids H/o Syncope, 2010:   --Nl  stress test, Holter: Atrial tachycardia --Carotid ultrasound 2010,2011 , 2014 no significant stenosis +FH CAD, mother age 71 + FH Breast ca- mother, sister x2, aunt --- on Evista   PLAN: HTN: Recent BMP as were normal. Refill Lotrel. Hyperlipidemia: Cont  simvastatin, last FLP satisfactory Anxiety: On clonazepam 3 times a day, refill printed, UDS today Shoulder pain: Treated elsewhere, recently had a round of prednisone, taking Voltaren very infrequently. Declined a egg-free flu shot. RTC 5 months, CPX, fasting

## 2016-07-05 NOTE — Patient Instructions (Signed)
GO TO THE LAB : provide a urine sample for a UDS work     GO TO THE FRONT DESK Schedule your next appointment for a  physical exam, fasting in 4-5 months

## 2016-07-05 NOTE — Progress Notes (Signed)
Pre visit review using our clinic review tool, if applicable. No additional management support is needed unless otherwise documented below in the visit note. 

## 2016-07-05 NOTE — Assessment & Plan Note (Signed)
HTN: Recent BMP as were normal. Refill Lotrel. Hyperlipidemia: Cont  simvastatin, last FLP satisfactory Anxiety: On clonazepam 3 times a day, refill printed, UDS today Shoulder pain: Treated elsewhere, recently had a round of prednisone, taking Voltaren very infrequently. Declined a egg-free flu shot. RTC 5 months, CPX, fas

## 2016-07-16 ENCOUNTER — Telehealth: Payer: Self-pay

## 2016-07-16 NOTE — Telephone Encounter (Signed)
UDS: 07/05/2016  Negative for Alprazolam- Rx'ed regularly   Moderate risk per Dr. Drue NovelPaz 07/16/2016

## 2016-08-07 ENCOUNTER — Telehealth: Payer: Self-pay | Admitting: Internal Medicine

## 2016-08-07 NOTE — Telephone Encounter (Signed)
Pt says that she received a bill that had a few codes that she would like to have clarity on.   DOS -  07/05/16    [facility]    Urinalogy and Toxicology Procedure codes [id number] Presumptive by analyzer -- 845.00    & also [id number] - Definitive 1-7 Drug testing 705.00    Pt says that she also had a co-pay of $40.00 - that pt says that she shouldn't have had for labs.    CB: [phone]: [phone]     FYI-- Pt says that she's been taking  KLONOPIN  medication for years w/ Dr. Drue NovelPaz. She says that she could have possibly taken a Tylenol 3 the night before having labs, explaining possible codeine in her system.

## 2016-08-07 NOTE — Telephone Encounter (Signed)
Called pt back to provide her with [facility] phone number. Advised pt to reach out to them also.

## 2016-08-07 NOTE — Telephone Encounter (Signed)
Pt called back in to update me on call from [facility]. Pt says that she spoke w/ Victorino DikeJennifer in the lab there. She says that she wouldn't give much detail but did state that a complete toxicology report was completed on that DOS. Pt would like to know why was a completed report was ordered?    DOS 07/05/16  Please advise.

## 2016-08-08 NOTE — Telephone Encounter (Signed)
Please call patient about [employer] test at [phone]

## 2016-08-09 NOTE — Telephone Encounter (Signed)
Spoke to patient.  She said she's aware that she's required to complete random drug screenings in order to receive clonazepam for our office.  She said she was concerned about the cost of the screening.  She called [facility] and was informed that her insurance would not pay for UDS.  She said she would call back to inquire if there is a co-pay.  She also wanted to discuss results.  She said that she may have taken a Tylenol 3 the night before and wanted to know if it showed up in UDS.  Pt was informed that it did not, but that the UDS did not detect clonazepam in her system.  She said that was odd, because she takes her clonazepam every day, most of the time, 3 times per day.  After discussing results, she had no further questions or concerns.

## 2016-08-09 NOTE — Telephone Encounter (Signed)
The patient is prescribed controlled substances (clonazepam) per our policy a  UDS is indicated. Also, she should not be billed or pay for the test.

## 2016-08-09 NOTE — Telephone Encounter (Signed)
Called and left a message for call back  

## 2016-08-15 ENCOUNTER — Ambulatory Visit (INDEPENDENT_AMBULATORY_CARE_PROVIDER_SITE_OTHER): Payer: Medicare Other

## 2016-08-15 DIAGNOSIS — Z23 Encounter for immunization: Secondary | ICD-10-CM

## 2016-10-09 ENCOUNTER — Telehealth: Payer: Self-pay | Admitting: Internal Medicine

## 2016-10-09 NOTE — Telephone Encounter (Signed)
Patient declined medicare wellness appointment, patient states her insurance gives out $15.00 gift cards after [facility]. Patient states she will notify PCP when medicare wellness is done.

## 2016-10-29 ENCOUNTER — Inpatient Hospital Stay (HOSPITAL_BASED_OUTPATIENT_CLINIC_OR_DEPARTMENT_OTHER)
Admission: EM | Admit: 2016-10-29 | Discharge: 2016-11-03 | DRG: 392 | Disposition: A | Discharge status: Home / Self Care | Payer: Medicare Other | Attending: Internal Medicine | Admitting: Internal Medicine

## 2016-10-29 ENCOUNTER — Emergency Department (HOSPITAL_BASED_OUTPATIENT_CLINIC_OR_DEPARTMENT_OTHER): Payer: Medicare Other

## 2016-10-29 ENCOUNTER — Encounter (HOSPITAL_BASED_OUTPATIENT_CLINIC_OR_DEPARTMENT_OTHER): Payer: Self-pay

## 2016-10-29 DIAGNOSIS — Z888 Allergy status to other drugs, medicaments and biological substances status: Secondary | ICD-10-CM

## 2016-10-29 DIAGNOSIS — E871 Hypo-osmolality and hyponatremia: Secondary | ICD-10-CM | POA: Diagnosis not present

## 2016-10-29 DIAGNOSIS — Z974 Presence of external hearing-aid: Secondary | ICD-10-CM | POA: Diagnosis not present

## 2016-10-29 DIAGNOSIS — Z8249 Family history of ischemic heart disease and other diseases of the circulatory system: Secondary | ICD-10-CM

## 2016-10-29 DIAGNOSIS — Z91012 Allergy to eggs: Secondary | ICD-10-CM

## 2016-10-29 DIAGNOSIS — E785 Hyperlipidemia, unspecified: Secondary | ICD-10-CM | POA: Diagnosis present

## 2016-10-29 DIAGNOSIS — K529 Noninfective gastroenteritis and colitis, unspecified: Secondary | ICD-10-CM

## 2016-10-29 DIAGNOSIS — Z91048 Other nonmedicinal substance allergy status: Secondary | ICD-10-CM

## 2016-10-29 DIAGNOSIS — Z79810 Long term (current) use of selective estrogen receptor modulators (SERMs): Secondary | ICD-10-CM | POA: Diagnosis not present

## 2016-10-29 DIAGNOSIS — K219 Gastro-esophageal reflux disease without esophagitis: Secondary | ICD-10-CM | POA: Diagnosis present

## 2016-10-29 DIAGNOSIS — E860 Dehydration: Secondary | ICD-10-CM | POA: Diagnosis not present

## 2016-10-29 DIAGNOSIS — E872 Acidosis, unspecified: Secondary | ICD-10-CM

## 2016-10-29 DIAGNOSIS — F419 Anxiety disorder, unspecified: Secondary | ICD-10-CM | POA: Diagnosis present

## 2016-10-29 DIAGNOSIS — Z91013 Allergy to seafood: Secondary | ICD-10-CM | POA: Diagnosis not present

## 2016-10-29 DIAGNOSIS — Z79899 Other long term (current) drug therapy: Secondary | ICD-10-CM

## 2016-10-29 DIAGNOSIS — Z91018 Allergy to other foods: Secondary | ICD-10-CM

## 2016-10-29 DIAGNOSIS — K625 Hemorrhage of anus and rectum: Secondary | ICD-10-CM

## 2016-10-29 DIAGNOSIS — I10 Essential (primary) hypertension: Secondary | ICD-10-CM | POA: Diagnosis not present

## 2016-10-29 DIAGNOSIS — Z7982 Long term (current) use of aspirin: Secondary | ICD-10-CM | POA: Diagnosis not present

## 2016-10-29 LAB — URINALYSIS, ROUTINE W REFLEX MICROSCOPIC
Bilirubin Urine: NEGATIVE
Glucose, UA: NEGATIVE mg/dL
Hgb urine dipstick: NEGATIVE
Ketones, ur: 15 mg/dL — AB
Leukocytes, UA: NEGATIVE
Nitrite: NEGATIVE
Protein, ur: NEGATIVE mg/dL
Specific Gravity, Urine: 1.019 (ref 1.005–1.030)
pH: 5.5 (ref 5.0–8.0)

## 2016-10-29 MED ORDER — FENTANYL CITRATE (PF) 100 MCG/2ML IJ SOLN
50.0000 ug | Freq: Once | INTRAMUSCULAR | Status: AC
  Administered 2016-10-30: 50 ug via INTRAVENOUS
  Filled 2016-10-29: qty 2

## 2016-10-29 MED ORDER — SODIUM CHLORIDE 0.9 % IV BOLUS (SEPSIS)
500.0000 mL | Freq: Once | INTRAVENOUS | Status: AC
  Administered 2016-10-29: 500 mL via INTRAVENOUS

## 2016-10-29 MED ORDER — ONDANSETRON HCL 4 MG/2ML IJ SOLN
4.0000 mg | Freq: Once | INTRAMUSCULAR | Status: AC
  Administered 2016-10-30: 4 mg via INTRAVENOUS
  Filled 2016-10-29: qty 2

## 2016-10-29 NOTE — ED Triage Notes (Signed)
C/o lower abd pain, diarrhea, chills started this am-denies vomiting-NAD-steady gait

## 2016-10-29 NOTE — ED Provider Notes (Signed)
[facility] Provider Note   CSN: [id number] Arrival date & time: 10/29/16  2015  By signing my name below, I, Modena Jansky, attest that this documentation has been prepared under the direction and in the presence of Destini Cambre, MD. Electronically Signed: Modena Jansky, Scribe. 10/29/2016. 11:26 PM.  History   Chief Complaint Chief Complaint  Patient presents with  . Abdominal Pain   The history is provided by the patient. No language interpreter was used.  Abdominal Pain   This is a new problem. The current episode started 12 to 24 hours ago. The problem occurs constantly. The problem has not changed since onset.The pain is associated with an unknown factor. The pain is located in the generalized abdominal region. The quality of the pain is cramping. The pain is moderate. Associated symptoms include diarrhea and nausea. Pertinent negatives include fever, vomiting and dysuria. Associated symptoms comments: BRBPR. Nothing aggravates the symptoms. Nothing relieves the symptoms. Past workup does not include barium enema. Past workup comments: colonoscopy. Her past medical history does not include ulcerative colitis or Crohn's disease.   HPI Comments: Linda Jefferson is a 71 y.o. female who presents to the Emergency Department complaining of constant moderate lower abdominal pain that started this morning. No modifying factors. She reports associated diarrhea and chills. She admits to prior hx of similar complaint a few years ago. She denies any sick contacts, vomiting, blood in stool, or other complaints.     PCP: Willow Ora, MD  Past Medical History:  Diagnosis Date  . Anxiety   . Carotid artery disease (HCC)    per u/s 01/2009- repeated u/s 7/11 "mild dz, no stenosis"  . Dyslipidemia   . Food allergy    mushrooms  . Hyperlipidemia   . Hypertension   . LBP (low back pain) 3/11   after a local injection, f/u [facility]  . Normal cardiac stress test 04/2009  .  Osteopenia   . Seasonal allergies   . 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 Wears hearing aid     Patient Active Problem List   Diagnosis Date Noted  . PCP NOTES >>>>> 08/12/2015  . Food allergy 08/02/2015  . Other seasonal allergic rhinitis 08/02/2015  . Allergic cough 02/14/2015  . Acute nonsuppurative otitis media of left ear 01/23/2015  . Alopecia 08/03/2014  . Dizziness 03/07/2014  . Shingles 02/02/2014  . Anxiety 07/23/2013  . Annual physical exam 08/07/2011  . Osteopenia 10/22/2010  . Hyperlipemia 02/18/2009  . Carotid artery disease (HCC) 02/17/2009  . HTN (hypertension) 05/20/2008    Past Surgical History:  Procedure Laterality Date  . CARPAL TUNNEL RELEASE    . CHOLECYSTECTOMY    . FOOT SURGERY  june 2014   B bunion   . TRIGGER FINGER RELEASE  03/31/2012   Procedure: RELEASE TRIGGER FINGER/A-1 PULLEY;  Surgeon: Wyn Forster., MD;  Location: [facility];  Service: Orthopedics;  Laterality: Right;  right index    OB History    Gravida Para Term Preterm AB Living   0 0 0 0 0     SAB TAB Ectopic Multiple Live Births   0 0 0           Home Medications    Prior to Admission medications   Medication Sig Start Date End Date Taking? Authorizing Provider  albuterol (PROVENTIL HFA;VENTOLIN HFA) 108 (90 BASE) MCG/ACT inhaler Inhale 2 puffs into the lungs every 6 (six) hours as needed  for wheezing or shortness of breath. Patient not taking: Reported on 07/05/2016 02/14/15   Waldon Merl, PA-C  amLODipine-benazepril (LOTREL) 5-10 MG capsule Take 1 capsule by mouth daily. 06/27/16   Wanda Plump, MD  aspirin EC 81 MG tablet Take 81 mg by mouth daily.    Historical Provider, MD  cetirizine (ZYRTEC) 10 MG tablet Take 10 mg by mouth daily.    Historical Provider, MD  clonazePAM (KLONOPIN) 0.5 MG tablet Take 1 tablet (0.5 mg total) by mouth 3 (three) times daily as needed for anxiety. 07/05/16   Wanda Plump, MD  diclofenac (VOLTAREN) 75 MG EC  tablet Take 75 mg by mouth 2 (two) times daily.    Historical Provider, MD  finasteride (PROSCAR) 5 MG tablet Take 1 tablet (5 mg total) by mouth daily. 07/05/16   Wanda Plump, MD  fluticasone [facility]) 50 MCG/ACT nasal spray Place 2 sprays into both nostrils daily. Patient not taking: Reported on 07/05/2016 01/23/15   Waldon Merl, PA-C  Multiple Vitamin (MULTIVITAMIN) capsule Take 1 capsule by mouth daily.      Historical Provider, MD  Probiotic Product (PROBIOTIC DAILY PO) Take 1 tablet by mouth daily.    Historical Provider, MD  raloxifene (EVISTA) 60 MG tablet Take 1 tablet (60 mg total) by mouth daily. 07/05/16   Wanda Plump, MD  simvastatin (ZOCOR) 20 MG tablet Take 1 tablet (20 mg total) by mouth at bedtime. 06/27/16   Wanda Plump, MD    Family History Family History  Problem Relation Age of Onset  . Coronary artery disease Mother 60  . Stroke Mother   . Diabetes Mother   . Breast cancer Mother     M, 2 sisters, 1 aunt  . Hypertension Other     M family  . Colon cancer Neg Hx     Social History Social History  Substance Use Topics  . Smoking status: Never Smoker  . Smokeless tobacco: Never Used  . Alcohol use 1.2 oz/week    2 Glasses of wine per week     Comment: socially      Allergies   Mushroom extract complex; Shellfish allergy; Oxycodone; Eggs or egg-derived products; Pseudoeph-doxylamine-dm-apap; and Tape   Review of Systems Review of Systems  Constitutional: Positive for chills. Negative for fever.  Respiratory: Negative for shortness of breath.   Cardiovascular: Negative for chest pain.  Gastrointestinal: Positive for abdominal pain, diarrhea and nausea. Negative for blood in stool and vomiting.  Genitourinary: Negative for dysuria.  All other systems reviewed and are negative.    Physical Exam Updated Vital Signs BP 181/89   Pulse 111   Temp 98.7 F (37.1 C) (Oral)   Resp 18   Ht 5\' 4"  (1.626 m)   Wt 136 lb (61.7 kg)   SpO2 98%   BMI 23.34 kg/m    Physical Exam  Constitutional: She is oriented to person, place, and time. She appears well-developed and well-nourished. No distress.  HENT:  Head: Normocephalic and atraumatic.  Mouth/Throat: Mucous membranes are normal. No oropharyngeal exudate.  Eyes: Conjunctivae are normal. Pupils are equal, round, and reactive to light.  Neck: Normal range of motion. Neck supple.  Cardiovascular: Normal rate and regular rhythm.   Pulmonary/Chest: Effort normal. No respiratory distress. She has no wheezes. She has no rales.  Abdominal: Soft. Bowel sounds are normal. She exhibits no mass. There is no rebound and no guarding. No hernia.  Mild suprapubic tenderness  Musculoskeletal: Normal  range of motion. She exhibits no edema.  Neurological: She is alert and oriented to person, place, and time.  Skin: Skin is warm and dry. Capillary refill takes less than 2 seconds.  Psychiatric: She has a normal mood and affect.  Nursing note and vitals reviewed.    ED Treatments / Results   Vitals:   10/29/16 2032  BP: 181/89  Pulse: 111  Resp: 18  Temp: 98.7 F (37.1 C)   DIAGNOSTIC STUDIES: Oxygen Saturation is 98% on RA, normal by my interpretation.   Results for orders placed or performed during the hospital encounter of 10/29/16  Urinalysis, Routine w reflex microscopic  Result Value Ref Range   Color, Urine YELLOW YELLOW   APPearance CLEAR CLEAR   Specific Gravity, Urine 1.019 1.005 - 1.030   pH 5.5 5.0 - 8.0   Glucose, UA NEGATIVE NEGATIVE mg/dL   Hgb urine dipstick NEGATIVE NEGATIVE   Bilirubin Urine NEGATIVE NEGATIVE   Ketones, ur 15 (A) NEGATIVE mg/dL   Protein, ur NEGATIVE NEGATIVE mg/dL   Nitrite NEGATIVE NEGATIVE   Leukocytes, UA NEGATIVE NEGATIVE  CBC with Differential/Platelet  Result Value Ref Range   WBC 11.1 (H) 4.0 - 10.5 K/uL   RBC 4.31 3.87 - 5.11 MIL/uL   Hemoglobin 13.2 12.0 - 15.0 g/dL   HCT 45.4 09.8 - 11.9 %   MCV 88.2 78.0 - 100.0 fL   MCH 30.6 26.0 - 34.0 pg     MCHC 34.7 30.0 - 36.0 g/dL   RDW 14.7 82.9 - 56.2 %   Platelets 351 150 - 400 K/uL   Neutrophils Relative % 75 %   Lymphocytes Relative 10 %   Monocytes Relative 0 %   Eosinophils Relative 0 %   Basophils Relative 0 %   Band Neutrophils 15 %   Metamyelocytes Relative 0 %   Myelocytes 0 %   Promyelocytes Absolute 0 %   Blasts 0 %   nRBC 0 0 /100 WBC   Other 0 %   Neutro Abs 10.0 (H) 1.7 - 7.7 K/uL   Lymphs Abs 1.1 0.7 - 4.0 K/uL   Monocytes Absolute 0.0 (L) 0.1 - 1.0 K/uL   Eosinophils Absolute 0.0 0.0 - 0.7 K/uL   Basophils Absolute 0.0 0.0 - 0.1 K/uL   Smear Review MORPHOLOGY UNREMARKABLE   Comprehensive metabolic panel  Result Value Ref Range   Sodium 129 (L) 135 - 145 mmol/L   Potassium 3.5 3.5 - 5.1 mmol/L   Chloride 97 (L) 101 - 111 mmol/L   CO2 22 22 - 32 mmol/L   Glucose, Bld 138 (H) 65 - 99 mg/dL   BUN 23 (H) 6 - 20 mg/dL   Creatinine, Ser 1.30 0.44 - 1.00 mg/dL   Calcium 9.6 8.9 - 86.5 mg/dL   Total Protein 7.0 6.5 - 8.1 g/dL   Albumin 4.0 3.5 - 5.0 g/dL   AST 37 15 - 41 U/L   ALT 23 14 - 54 U/L   Alkaline Phosphatase 83 38 - 126 U/L   Total Bilirubin 0.7 0.3 - 1.2 mg/dL   GFR calc non Af Amer >60 >60 mL/min   GFR calc Af Amer >60 >60 mL/min   Anion gap 10 5 - 15   Ct Abdomen Pelvis W Contrast  Result Date: 10/30/2016 CLINICAL DATA:  71 year old female with lower abdominal pain and diarrhea. EXAM: CT ABDOMEN AND PELVIS WITH CONTRAST TECHNIQUE: Multidetector CT imaging of the abdomen and pelvis was performed using the standard protocol following  bolus administration of intravenous contrast. CONTRAST:  100mL ISOVUE-300 IOPAMIDOL (ISOVUE-300) INJECTION 61% COMPARISON:  Abdominal CT dated 04/30/2008 FINDINGS: Lower chest: Minimal bibasilar linear atelectasis/ scarring. The visualized lung bases are otherwise clear. No intra-abdominal free air.  No free fluid. Hepatobiliary: Cholecystectomy. Mild biliary ductal dilatation likely post cholecystectomy. There is normal  tapering of the CBD at the head of the pancreas. There is mild irregularity of the hepatic contour which may represent early changes of cirrhosis. Clinical correlation is recommended. There is a 2.8 cm left hepatic cyst larger since the prior CT of 2009. Subcentimeter right hepatic hypodense lesion is too small to characterize but appears similar to prior CT and likely represents a cyst. Pancreas: Unremarkable. No pancreatic ductal dilatation or surrounding inflammatory changes. Spleen: Normal in size without focal abnormality. Adrenals/Urinary Tract: The adrenal glands appear unremarkable. There is extrarenal pelvis with mild pelviectasis bilaterally. No hydronephrosis. The visualized ureters and urinary bladder appear unremarkable. Subcentimeter right renal hypodense lesion is too small to characterize but likely represents a cyst. Stomach/Bowel: Oral contrast is noted within the stomach and multiple loops of small bowel without evidence of obstruction. There is inflammatory changes and circumferential thickening of the distal half of the transverse colon and entire descending colon compatible with colitis. Loose stool noted within the rectosigmoid compatible with diarrheal state. Correlation with clinical exam and stool cultures recommended. A small sigmoid diverticula noted without active inflammation. The appendix is normal. Vascular/Lymphatic: There is mild aortoiliac atherosclerotic disease. The origins of the celiac axis, SMA, IMA as well as the origins of the renal arteries are patent. The SMV, splenic vein, and the main portal vein are patent. No portal venous gas identified. There is no adenopathy. Reproductive: There is a small calcified anterior uterine body fibroid. The left ovary is unremarkable. The right ovary is not well visualized. No pelvic mass noted. Other: None Musculoskeletal: There is degenerative changes of the spine. No acute fracture. IMPRESSION: Colitis primarily involving the transverse  and descending colon likely infectious or inflammatory in etiology. Ischemic colitis is less likely. Correlation with clinical exam and stool cultures recommended. No bowel obstruction. Normal appendix. Slight irregularity of the liver contour may represent early changes of cirrhosis. Clinical correlation is recommended. Electronically Signed   By: Elgie CollardArash  Radparvar M.D.   On: 10/30/2016 02:01    COORDINATION OF CARE: 11:30 PM- Pt advised of plan for treatment and pt agrees.   Procedures Procedures (including critical care time)  Medications Ordered in ED  Medications  0.9 %  sodium chloride infusion ( Intravenous New Bag/Given 10/30/16 0118)  ciprofloxacin (CIPRO) IVPB 400 mg (not administered)  metroNIDAZOLE (FLAGYL) IVPB 500 mg (500 mg Intravenous New Bag/Given 10/30/16 0245)  0.9 %  sodium chloride infusion (not administered)  sodium chloride 0.9 % bolus 500 mL (0 mLs Intravenous Stopped 10/30/16 0039)  fentaNYL (SUBLIMAZE) injection 50 mcg (50 mcg Intravenous Given 10/30/16 0001)  ondansetron (ZOFRAN) injection 4 mg (4 mg Intravenous Given 10/30/16 0000)  iopamidol (ISOVUE-300) 61 % injection 100 mL (100 mLs Intravenous Contrast Given 10/30/16 0135)  fentaNYL (SUBLIMAZE) injection 50 mcg (50 mcg Intravenous Given 10/30/16 0243)     Initial Impression / Assessment and Plan / ED Course  Colitis: affects more than 2/3 of the colon will need serial H/H due to bleeding and IV antibiotcs    I personally performed the services described in this documentation, which was scribed in my presence. The recorded information has been reviewed and is accurate.  Cy Blamer, MD 10/30/16 [phone]

## 2016-10-30 ENCOUNTER — Encounter (HOSPITAL_BASED_OUTPATIENT_CLINIC_OR_DEPARTMENT_OTHER): Payer: Self-pay | Admitting: Emergency Medicine

## 2016-10-30 ENCOUNTER — Telehealth: Payer: Self-pay | Admitting: Internal Medicine

## 2016-10-30 DIAGNOSIS — E784 Other hyperlipidemia: Secondary | ICD-10-CM | POA: Diagnosis not present

## 2016-10-30 DIAGNOSIS — K529 Noninfective gastroenteritis and colitis, unspecified: Secondary | ICD-10-CM | POA: Diagnosis not present

## 2016-10-30 DIAGNOSIS — E872 Acidosis, unspecified: Secondary | ICD-10-CM

## 2016-10-30 DIAGNOSIS — I10 Essential (primary) hypertension: Secondary | ICD-10-CM

## 2016-10-30 DIAGNOSIS — E871 Hypo-osmolality and hyponatremia: Secondary | ICD-10-CM | POA: Diagnosis not present

## 2016-10-30 DIAGNOSIS — K625 Hemorrhage of anus and rectum: Secondary | ICD-10-CM

## 2016-10-30 LAB — COMPREHENSIVE METABOLIC PANEL
ALT: 23 U/L (ref 14–54)
ALT: 23 U/L (ref 14–54)
AST: 37 U/L (ref 15–41)
AST: 37 U/L (ref 15–41)
Albumin: 3.6 g/dL (ref 3.5–5.0)
Albumin: 4 g/dL (ref 3.5–5.0)
Alkaline Phosphatase: 78 U/L (ref 38–126)
Alkaline Phosphatase: 83 U/L (ref 38–126)
Anion gap: 10 (ref 5–15)
Anion gap: 9 (ref 5–15)
BUN: 11 mg/dL (ref 6–20)
BUN: 23 mg/dL — ABNORMAL HIGH (ref 6–20)
CO2: 22 mmol/L (ref 22–32)
CO2: 26 mmol/L (ref 22–32)
Calcium: 9.3 mg/dL (ref 8.9–10.3)
Calcium: 9.6 mg/dL (ref 8.9–10.3)
Chloride: 103 mmol/L (ref 101–111)
Chloride: 97 mmol/L — ABNORMAL LOW (ref 101–111)
Creatinine, Ser: 0.78 mg/dL (ref 0.44–1.00)
Creatinine, Ser: 0.79 mg/dL (ref 0.44–1.00)
GFR calc Af Amer: >60 mL/min (ref >60)
GFR calc Af Amer: >60 mL/min (ref >60)
GFR calc non Af Amer: >60 mL/min (ref >60)
GFR calc non Af Amer: >60 mL/min (ref >60)
Glucose, Bld: 110 mg/dL — ABNORMAL HIGH (ref 65–99)
Glucose, Bld: 138 mg/dL — ABNORMAL HIGH (ref 65–99)
Potassium: 3.4 mmol/L — ABNORMAL LOW (ref 3.5–5.1)
Potassium: 3.5 mmol/L (ref 3.5–5.1)
Sodium: 129 mmol/L — ABNORMAL LOW (ref 135–145)
Sodium: 138 mmol/L (ref 135–145)
Total Bilirubin: 0.7 mg/dL (ref 0.3–1.2)
Total Bilirubin: 0.8 mg/dL (ref 0.3–1.2)
Total Protein: 6.4 g/dL — ABNORMAL LOW (ref 6.5–8.1)
Total Protein: 7 g/dL (ref 6.5–8.1)

## 2016-10-30 LAB — CBC WITH DIFFERENTIAL/PLATELET
Band Neutrophils: 15 %
Basophils Absolute: 0 10*3/uL (ref 0.0–0.1)
Basophils Relative: 0 %
Blasts: 0 %
Eosinophils Absolute: 0 10*3/uL (ref 0.0–0.7)
Eosinophils Relative: 0 %
HCT: 38 % (ref 36.0–46.0)
Hemoglobin: 13.2 g/dL (ref 12.0–15.0)
Lymphocytes Relative: 10 %
Lymphs Abs: 1.1 10*3/uL (ref 0.7–4.0)
MCH: 30.6 pg (ref 26.0–34.0)
MCHC: 34.7 g/dL (ref 30.0–36.0)
MCV: 88.2 fL (ref 78.0–100.0)
Metamyelocytes Relative: 0 %
Monocytes Absolute: 0 10*3/uL — ABNORMAL LOW (ref 0.1–1.0)
Monocytes Relative: 0 %
Myelocytes: 0 %
Neutro Abs: 10 10*3/uL — ABNORMAL HIGH (ref 1.7–7.7)
Neutrophils Relative %: 75 %
Other: 0 %
Platelets: 351 10*3/uL (ref 150–400)
Promyelocytes Absolute: 0 %
RBC: 4.31 MIL/uL (ref 3.87–5.11)
RDW: 12.9 % (ref 11.5–15.5)
WBC: 11.1 10*3/uL — ABNORMAL HIGH (ref 4.0–10.5)
nRBC: 0 /100 WBC

## 2016-10-30 LAB — LACTIC ACID, PLASMA: Lactic Acid, Venous: 1.5 mmol/L (ref 0.5–1.9)

## 2016-10-30 LAB — I-STAT CG4 LACTIC ACID, ED: Lactic Acid, Venous: 2.04 mmol/L (ref 0.5–1.9)

## 2016-10-30 LAB — PROCALCITONIN: Procalcitonin: <0.1 ng/mL

## 2016-10-30 MED ORDER — CIPROFLOXACIN IN D5W 400 MG/200ML IV SOLN
400.0000 mg | Freq: Two times a day (BID) | INTRAVENOUS | Status: DC
Start: 2016-10-30 — End: 2016-11-02
  Administered 2016-10-30 – 2016-11-02 (×6): 400 mg via INTRAVENOUS
  Filled 2016-10-30 (×7): qty 200

## 2016-10-30 MED ORDER — FINASTERIDE 5 MG PO TABS
5.0000 mg | ORAL_TABLET | Freq: Every day | ORAL | Status: DC
  Administered 2016-10-30 – 2016-11-03 (×5): 5 mg via ORAL
  Filled 2016-10-30 (×5): qty 1

## 2016-10-30 MED ORDER — POTASSIUM CHLORIDE IN NACL 20-0.9 MEQ/L-% IV SOLN
INTRAVENOUS | Status: DC
  Administered 2016-10-30: 125 mL/h via INTRAVENOUS
  Administered 2016-11-01 – 2016-11-02 (×3): via INTRAVENOUS
  Filled 2016-10-30 (×8): qty 1000

## 2016-10-30 MED ORDER — PROMETHAZINE HCL 25 MG/ML IJ SOLN
12.5000 mg | Freq: Four times a day (QID) | INTRAMUSCULAR | Status: DC | PRN
  Administered 2016-10-30 (×2): 12.5 mg via INTRAVENOUS
  Filled 2016-10-30 (×2): qty 1

## 2016-10-30 MED ORDER — MORPHINE SULFATE (PF) 2 MG/ML IV SOLN: 1.0000 mg | INTRAVENOUS | Status: DC | PRN

## 2016-10-30 MED ORDER — ACETAMINOPHEN 325 MG PO TABS
650.0000 mg | ORAL_TABLET | Freq: Four times a day (QID) | ORAL | Status: DC | PRN
Start: 2016-10-30 — End: 2016-11-03

## 2016-10-30 MED ORDER — ACETAMINOPHEN 650 MG RE SUPP: 650.0000 mg | Freq: Four times a day (QID) | RECTAL | Status: DC | PRN

## 2016-10-30 MED ORDER — METRONIDAZOLE IN NACL 5-0.79 MG/ML-% IV SOLN
500.0000 mg | Freq: Once | INTRAVENOUS | Status: AC
  Administered 2016-10-30: 500 mg via INTRAVENOUS
  Filled 2016-10-30: qty 100

## 2016-10-30 MED ORDER — ONDANSETRON HCL 4 MG/2ML IJ SOLN: 4.0000 mg | Freq: Four times a day (QID) | INTRAMUSCULAR | Status: DC | PRN

## 2016-10-30 MED ORDER — AMLODIPINE BESYLATE 5 MG PO TABS
5.0000 mg | ORAL_TABLET | Freq: Every day | ORAL | Status: DC
  Administered 2016-10-30 – 2016-10-31 (×2): 5 mg via ORAL
  Filled 2016-10-30 (×3): qty 1

## 2016-10-30 MED ORDER — IOPAMIDOL (ISOVUE-300) INJECTION 61%
100.0000 mL | Freq: Once | INTRAVENOUS | Status: AC | PRN
  Administered 2016-10-30: 100 mL via INTRAVENOUS

## 2016-10-30 MED ORDER — FENTANYL CITRATE (PF) 100 MCG/2ML IJ SOLN
50.0000 ug | Freq: Once | INTRAMUSCULAR | Status: AC
  Administered 2016-10-30: 50 ug via INTRAVENOUS
  Filled 2016-10-30: qty 2

## 2016-10-30 MED ORDER — AMLODIPINE BESY-BENAZEPRIL HCL 5-10 MG PO CAPS: 1.0000 | ORAL_CAPSULE | Freq: Every day | ORAL | Status: DC

## 2016-10-30 MED ORDER — BENAZEPRIL HCL 5 MG PO TABS
10.0000 mg | ORAL_TABLET | Freq: Every day | ORAL | Status: DC
  Administered 2016-10-30 – 2016-10-31 (×2): 10 mg via ORAL
  Filled 2016-10-30 (×3): qty 2

## 2016-10-30 MED ORDER — METRONIDAZOLE IN NACL 5-0.79 MG/ML-% IV SOLN
500.0000 mg | Freq: Three times a day (TID) | INTRAVENOUS | Status: DC
Start: 2016-10-30 — End: 2016-11-02
  Administered 2016-10-30 – 2016-11-02 (×9): 500 mg via INTRAVENOUS
  Filled 2016-10-30 (×11): qty 100

## 2016-10-30 MED ORDER — CIPROFLOXACIN IN D5W 400 MG/200ML IV SOLN
400.0000 mg | Freq: Once | INTRAVENOUS | Status: AC
  Administered 2016-10-30: 400 mg via INTRAVENOUS
  Filled 2016-10-30: qty 200

## 2016-10-30 MED ORDER — SODIUM CHLORIDE 0.9 % IV SOLN
INTRAVENOUS | Status: DC
  Administered 2016-10-30: 01:00:00 via INTRAVENOUS

## 2016-10-30 MED ORDER — RALOXIFENE HCL 60 MG PO TABS
60.0000 mg | ORAL_TABLET | Freq: Every day | ORAL | Status: DC
  Administered 2016-10-30 – 2016-11-03 (×5): 60 mg via ORAL
  Filled 2016-10-30 (×5): qty 1

## 2016-10-30 MED ORDER — SODIUM CHLORIDE 0.9 % IV SOLN
INTRAVENOUS | Status: DC
  Administered 2016-10-30: 06:00:00 via INTRAVENOUS

## 2016-10-30 MED ORDER — LORATADINE 10 MG PO TABS
10.0000 mg | ORAL_TABLET | Freq: Every day | ORAL | Status: DC
  Administered 2016-10-30 – 2016-11-03 (×5): 10 mg via ORAL
  Filled 2016-10-30 (×5): qty 1

## 2016-10-30 MED ORDER — CLONAZEPAM 0.5 MG PO TABS
0.5000 mg | ORAL_TABLET | Freq: Once | ORAL | Status: AC
  Administered 2016-10-30: 0.5 mg via ORAL
  Filled 2016-10-30: qty 1

## 2016-10-30 MED ORDER — CLONAZEPAM 0.5 MG PO TABS
0.5000 mg | ORAL_TABLET | Freq: Three times a day (TID) | ORAL | Status: DC | PRN
  Administered 2016-10-30 – 2016-11-03 (×7): 0.5 mg via ORAL
  Filled 2016-10-30 (×7): qty 1

## 2016-10-30 NOTE — Telephone Encounter (Signed)
Spoke w/ Iantha FallenKenneth, PCP recommendations informed. Iantha FallenKenneth verbalized understanding.

## 2016-10-30 NOTE — Care Management CC44 (Signed)
Condition Code 44 Documentation Completed  Patient Details  Name: Linda Jefferson MRN: [id number] Date of Birth: 03-28-1946   Condition Code 44 given:  Yes Patient signature on Condition Code 44 notice:  Yes Documentation of 2 MD's agreement:  Yes Code 44 added to claim:  Yes    Durenda GuthrieBrady, Steffani Dionisio Naomi, RN 10/30/2016, 5:31 PM

## 2016-10-30 NOTE — ED Notes (Signed)
[facility] called for hospitalitis

## 2016-10-30 NOTE — Telephone Encounter (Signed)
At her request, I would refer her to one of our gastroenterologists at [facility] once she is out of the hospital.  All of them are excellent practitioners. Enter a referral if needed. If she like to switch practices while she is admitted, is a little more complicated but she could talk with her hospitalist

## 2016-10-30 NOTE — Telephone Encounter (Signed)
Pt's spouse Iantha FallenKenneth called in. He says that pt has been hospitalized and Dx with Colitis. He would like to have Dr. Drue NovelPaz suggest a [facility] That his wife could see. He says that pt has had a terrible experience with [facility].    CB: [phone]

## 2016-10-30 NOTE — Care Management Obs Status (Signed)
MEDICARE OBSERVATION STATUS NOTIFICATION   Patient Details  Name: Linda MinersKathleen L Napoli MRN: [id number] Date of Birth: 17-Jun-1946   Medicare Observation Status Notification Given:  Yes    Durenda GuthrieBrady, Stevie Charter Naomi, RN 10/30/2016, 5:31 PM

## 2016-10-30 NOTE — H&P (Signed)
History and Physical    Linda Jefferson ZOX:[id number] DOB: 1946/03/24 DOA: 10/29/2016   PCP: Willow Ora, MD   Patient coming from/Resides with: Private residence/lives with husband  Admission status: Inpatient/floor -medically necessary to stay a minimum 2 midnights to rule out impending and/or unexpected changes in physiologic status that may differ from initial evaluation performed in the ER and/or at time of admission. He presents with sudden onset suprapubic, crampy abdominal pain associated with bloody diarrhea with CT findings of colitis. Patient will require IV fluids, partial bowel rest consisting of clear liquids only, IV medications to manage pain and nausea as well as IV antibiotics. She will also require stool studies to determine if infectious versus ischemic in etiology. She also presented with SIRS physiology included elevated serum lactic acid which will need to be trended during the hospitalization. Anticipate we'll need to follow bleeding symptoms until resolved which may take several days and the monitor for recurrence after initiation/advanced in diet  Chief Complaint: Crampy abdominal pain/bloody diarrhea  HPI: Linda Jefferson is a 71 y.o. female with medical history significant for hypertension, this up of dyslipidemia, history of left shoulder repair with recurrent issues of pain, anxiety disorder and previous colitis post colonoscopy. Patient reports that she awakened yesterday morning not feeling well. She began to feel "clammy and developed crampy lower abdominal pain over the suprapubic region. Subsequent she had multiple watery/bloody diarrheal stools. She initially felt it was food borne related stating she had eaten a salad with tuna the previous night that also reported she has never had problems with this particular meal in the past. She also reported increased belching sensation. She is not had any fevers. No recent antibiotics. No sick contacts with similar issues. No  family history of ulcer colitis or Crohn's disease. She's had 2 bloody stool since arriving to this facility after being transferred from [facility].  ED Course:  Vital Signs: BP (!) 161/82 (BP Location: Right Arm)   Pulse 88   Temp 98 F (36.7 C) (Oral)   Resp 18   Ht 5\' 4"  (1.626 m)   Wt 61.7 kg (136 lb)   SpO2 98%   BMI 23.34 kg/m  CT abdomen and pelvis w/ contrast: Colitis primarily involving the transverse and descending colon likely infectious or inflammatory in etiology. Ischemic colitis is less likely. Incidental finding of slight irregularity of the liver contour that may represent early changes of cirrhosis. Lab data: Sodium 129, potassium 3.5, chloride 97, glucose 138, BUN 23, creatinine 0.79, LFTs normal, lactic acid 2.04, white count 11,100 with neutrophils 75%, absolute neutrophils 10%, hemoglobin 13.2, platelets 351,000, urinalysis unremarkable except for 15 ketones Medications and treatments: Normal saline 500 mL 1, fentanyl 50 g IV 2 doses, Zofran 4 mg IV 1, Cipro 400 mg IV 1, Flagyl 500 mg IV 1  Review of Systems:  In addition to the HPI above,  No Fever-chills, myalgias or other constitutional symptoms No Headache, changes with Vision or hearing, new weakness, tingling, numbness in any extremity, dizziness, dysarthria or word finding difficulty, gait disturbance or imbalance, tremors or seizure activity No problems swallowing food or Liquids, indigestion/reflux, choking or coughing while eating, abdominal pain with or after eating No Chest pain, Cough or Shortness of Breath, palpitations, orthopnea or DOE No dysuria, malodorous urine, hematuria or flank pain No new skin rashes, lesions, masses or bruises, No new joint pains, aches, swelling or redness No recent unintentional weight gain or loss No polyuria, polydypsia or polyphagia   Past  Medical History:  Diagnosis Date  . Anxiety   . Carotid artery disease (HCC)    per u/s 01/2009- repeated u/s 7/11 "mild dz, no  stenosis"  . Dyslipidemia   . Food allergy    mushrooms  . Hyperlipidemia   . Hypertension   . LBP (low back pain) 3/11   after a local injection, f/u [facility]  . Normal cardiac stress test 04/2009  . Osteopenia   . Seasonal allergies   . 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 Wears hearing aid     Past Surgical History:  Procedure Laterality Date  . CARPAL TUNNEL RELEASE    . CHOLECYSTECTOMY    . FOOT SURGERY  june 2014   B bunion   . TRIGGER FINGER RELEASE  03/31/2012   Procedure: RELEASE TRIGGER FINGER/A-1 PULLEY;  Surgeon: Wyn Forster., MD;  Location: [facility];  Service: Orthopedics;  Laterality: Right;  right index    Social History   Social History  . Marital status: Married    Spouse name: N/A  . Number of children: 3  . Years of education: N/A   Occupational History  . TEACHER-- retired    .  [employer]   Social History Main Topics  . Smoking status: Never Smoker  . Smokeless tobacco: Never Used  . Alcohol use 1.2 oz/week    2 Glasses of wine per week     Comment: socially   . Drug use: No  . Sexual activity: Not on file   Other Topics Concern  . Not on file   Social History Narrative   ** Merged History Encounter **       3 children, one in [place] (PhD)         Mobility: Without assistive devices Work history: Retired Programmer, systems   Allergies  Allergen Reactions  . Mushroom Extract Complex Shortness Of Breath  . Shellfish Allergy Anaphylaxis  . Oxycodone Itching    "skin crawling"  . Eggs Or Egg-Derived Products   . Pseudoeph-Doxylamine-Dm-Apap     High blood pressure  . Tape Rash    Patient states use paper tape only. All other tapes cause rash.    Family History  Problem Relation Age of Onset  . Coronary artery disease Mother 61  . Stroke Mother   . Diabetes Mother   . Breast cancer Mother     M, 2 sisters, 1 aunt  . Hypertension Other     M family  . Colon cancer Neg Hx       Prior to Admission medications   Medication Sig Start Date End Date Taking? Authorizing Provider  albuterol (PROVENTIL HFA;VENTOLIN HFA) 108 (90 BASE) MCG/ACT inhaler Inhale 2 puffs into the lungs every 6 (six) hours as needed for wheezing or shortness of breath. Patient not taking: Reported on 07/05/2016 02/14/15   Waldon Merl, PA-C  amLODipine-benazepril (LOTREL) 5-10 MG capsule Take 1 capsule by mouth daily. 06/27/16   Wanda Plump, MD  aspirin EC 81 MG tablet Take 81 mg by mouth daily.    Historical Provider, MD  cetirizine (ZYRTEC) 10 MG tablet Take 10 mg by mouth daily.    Historical Provider, MD  clonazePAM (KLONOPIN) 0.5 MG tablet Take 1 tablet (0.5 mg total) by mouth 3 (three) times daily as needed for anxiety. 07/05/16   Wanda Plump, MD  diclofenac (VOLTAREN) 75 MG EC tablet Take 75 mg by mouth 2 (two) times  daily.    Historical Provider, MD  finasteride (PROSCAR) 5 MG tablet Take 1 tablet (5 mg total) by mouth daily. 07/05/16   Wanda PlumpJose E Paz, MD  fluticasone [facility](FLONASE) 50 MCG/ACT nasal spray Place 2 sprays into both nostrils daily. Patient not taking: Reported on 07/05/2016 01/23/15   Waldon MerlWilliam C Martin, PA-C  Multiple Vitamin (MULTIVITAMIN) capsule Take 1 capsule by mouth daily.      Historical Provider, MD  Probiotic Product (PROBIOTIC DAILY PO) Take 1 tablet by mouth daily.    Historical Provider, MD  raloxifene (EVISTA) 60 MG tablet Take 1 tablet (60 mg total) by mouth daily. 07/05/16   Wanda PlumpJose E Paz, MD  simvastatin (ZOCOR) 20 MG tablet Take 1 tablet (20 mg total) by mouth at bedtime. 06/27/16   Wanda PlumpJose E Paz, MD    Physical Exam: Vitals:   10/29/16 2032 10/29/16 2033 10/30/16 0316 10/30/16 0500  BP: 181/89  160/84 (!) 161/82  Pulse: 111  84 88  Resp: 18  18 18   Temp: 98.7 F (37.1 C)  99 F (37.2 C) 98 F (36.7 C)  TempSrc: Oral  Oral Oral  SpO2: 98%  98% 98%  Weight:  61.7 kg (136 lb)    Height:  5\' 4"  (1.626 m)        Constitutional: NAD, calm, comfortable Eyes: PERRL,  lids and conjunctivae normal ENMT: Mucous membranes are moist. Posterior pharynx clear of any exudate or lesions.Normal dentition.  Neck: normal, supple, no masses, no thyromegaly Respiratory: clear to auscultation bilaterally, no wheezing, no crackles. Normal respiratory effort. No accessory muscle use.  Cardiovascular: Regular rate and rhythm, no murmurs / rubs / gallops. No extremity edema. 2+ pedal pulses. No carotid bruits.  Abdomen: Focal tenderness over suprapubic region, no masses palpated. No hepatosplenomegaly. Bowel sounds positive and hyperactive. Reports of continued bloody stool since arriving to this facility Musculoskeletal: no clubbing / cyanosis. No joint deformity upper and lower extremities. Good ROM, no contractures. Normal muscle tone.  Skin: no rashes, lesions, ulcers. No induration Neurologic: CN 2-12 grossly intact. Sensation intact, DTR normal. Strength 5/5 x all 4 extremities.  Psychiatric: Normal judgment and insight. Alert and oriented x 3. Normal mood.    Labs on Admission: I have personally reviewed following labs and imaging studies  CBC:  Recent Labs Lab 10/29/16 2355  WBC 11.1*  NEUTROABS 10.0*  HGB 13.2  HCT 38.0  MCV 88.2  PLT 351   Basic Metabolic Panel:  Recent Labs Lab 10/29/16 2355  NA 129*  K 3.5  CL 97*  CO2 22  GLUCOSE 138*  BUN 23*  CREATININE 0.79  CALCIUM 9.6   GFR: Estimated Creatinine Clearance: 55.7 mL/min (by C-G formula based on SCr of 0.79 mg/dL). Liver Function Tests:  Recent Labs Lab 10/29/16 2355  AST 37  ALT 23  ALKPHOS 83  BILITOT 0.7  PROT 7.0  ALBUMIN 4.0   No results for input(s): LIPASE, AMYLASE in the last 168 hours. No results for input(s): AMMONIA in the last 168 hours. Coagulation Profile: No results for input(s): INR, PROTIME in the last 168 hours. Cardiac Enzymes: No results for input(s): CKTOTAL, CKMB, CKMBINDEX, TROPONINI in the last 168 hours. BNP (last 3 results) No results for  input(s): PROBNP in the last 8760 hours. HbA1C: No results for input(s): HGBA1C in the last 72 hours. CBG: No results for input(s): GLUCAP in the last 168 hours. Lipid Profile: No results for input(s): CHOL, HDL, LDLCALC, TRIG, CHOLHDL, LDLDIRECT in the last 72 hours.  Thyroid Function Tests: No results for input(s): TSH, T4TOTAL, FREET4, T3FREE, THYROIDAB in the last 72 hours. Anemia Panel: No results for input(s): VITAMINB12, FOLATE, FERRITIN, TIBC, IRON, RETICCTPCT in the last 72 hours. Urine analysis:    Component Value Date/Time   COLORURINE YELLOW 10/29/2016 2040   APPEARANCEUR CLEAR 10/29/2016 2040   LABSPEC 1.019 10/29/2016 2040   PHURINE 5.5 10/29/2016 2040   GLUCOSEU NEGATIVE 10/29/2016 2040   HGBUR NEGATIVE 10/29/2016 2040   BILIRUBINUR NEGATIVE 10/29/2016 2040   KETONESUR 15 (A) 10/29/2016 2040   PROTEINUR NEGATIVE 10/29/2016 2040   NITRITE NEGATIVE 10/29/2016 2040   LEUKOCYTESUR NEGATIVE 10/29/2016 2040   Sepsis Labs: @LABRCNTIP (procalcitonin:4,lacticidven:4) )No results found for this or any previous visit (from the past 240 hour(s)).   Radiological Exams on Admission: Ct Abdomen Pelvis W Contrast  Result Date: 10/30/2016 CLINICAL DATA:  71 year old female with lower abdominal pain and diarrhea. EXAM: CT ABDOMEN AND PELVIS WITH CONTRAST TECHNIQUE: Multidetector CT imaging of the abdomen and pelvis was performed using the standard protocol following bolus administration of intravenous contrast. CONTRAST:  ISOVUE-300 IOPAMIDOL (ISOVUE-300) INJECTION 61% COMPARISON:  Abdominal CT dated 04/30/2008 FINDINGS: Lower chest: Minimal bibasilar linear atelectasis/ scarring. The visualized lung bases are otherwise clear. No intra-abdominal free air.  No free fluid. Hepatobiliary: Cholecystectomy. Mild biliary ductal dilatation likely post cholecystectomy. There is normal tapering of the CBD at the head of the pancreas. There is mild irregularity of the hepatic contour which  may represent early changes of cirrhosis. Clinical correlation is recommended. There is a 2.8 cm left hepatic cyst larger since the prior CT of 2009. Subcentimeter right hepatic hypodense lesion is too small to characterize but appears similar to prior CT and likely represents a cyst. Pancreas: Unremarkable. No pancreatic ductal dilatation or surrounding inflammatory changes. Spleen: Normal in size without focal abnormality. Adrenals/Urinary Tract: The adrenal glands appear unremarkable. There is extrarenal pelvis with mild pelviectasis bilaterally. No hydronephrosis. The visualized ureters and urinary bladder appear unremarkable. Subcentimeter right renal hypodense lesion is too small to characterize but likely represents a cyst. Stomach/Bowel: Oral contrast is noted within the stomach and multiple loops of small bowel without evidence of obstruction. There is inflammatory changes and circumferential thickening of the distal half of the transverse colon and entire descending colon compatible with colitis. Loose stool noted within the rectosigmoid compatible with diarrheal state. Correlation with clinical exam and stool cultures recommended. A small sigmoid diverticula noted without active inflammation. The appendix is normal. Vascular/Lymphatic: There is mild aortoiliac atherosclerotic disease. The origins of the celiac axis, SMA, IMA as well as the origins of the renal arteries are patent. The SMV, splenic vein, and the main portal vein are patent. No portal venous gas identified. There is no adenopathy. Reproductive: There is a small calcified anterior uterine body fibroid. The left ovary is unremarkable. The right ovary is not well visualized. No pelvic mass noted. Other: None Musculoskeletal: There is degenerative changes of the spine. No acute fracture. IMPRESSION: Colitis primarily involving the transverse and descending colon likely infectious or inflammatory in etiology. Ischemic colitis is less likely.  Correlation with clinical exam and stool cultures recommended. No bowel obstruction. Normal appendix. Slight irregularity of the liver contour may represent early changes of cirrhosis. Clinical correlation is recommended. Electronically Signed   By: Elgie Collard M.D.   On: 10/30/2016 02:01     Assessment/Plan Principal Problem:   Colitis -Patient presents with sudden onset of abdominal pain and bloody diarrhea with CT evidence of colitis involving  the transverse and ascending colon -Empiric Cipro/Flagyl IV -GI pathogen panel, fecal lactoferrin, Procalcitonin -IV morphine for pain-avoid NSAIDs with active bleeding -Zofran and Phenergan for nausea -Follow CBC at least daily, more frequently if develops more brisk bleeding-currently no evidence of anemia -If symptoms not improved within 48 hours (or if bleeding worsens) consider GI consultation  Active Problems:   Dehydration with hyponatremia -Secondary to frequent diarrheal stools -Repeat electrolyte panel now since last completed yesterday evening -Continue IV fluids-add potassium since initial potassium was borderline low at 3.5 -Allow clear liquids as tolerated    HTN (hypertension) -Blood pressure elevated -Continue home medications-patient reports typically takes at bedtime    Hyperlipemia -Hold statin until diet advanced to solid    Anxiety -Continue preadmission clonazepam      DVT prophylaxis: SCDs Code Status: Full  Family Communication: No family at bedside Disposition Plan: Anticipate discharge back to preadmission home environment when medically stable Consults called: None    ELLIS,ALLISON L. ANP-BC [facility] Pager [phone]   If 7PM-7AM, please contact night-coverage [email or web address] Password [email or web address]  10/30/2016, 8:02 AM

## 2016-10-31 DIAGNOSIS — I10 Essential (primary) hypertension: Secondary | ICD-10-CM | POA: Diagnosis not present

## 2016-10-31 DIAGNOSIS — K529 Noninfective gastroenteritis and colitis, unspecified: Secondary | ICD-10-CM | POA: Diagnosis not present

## 2016-10-31 DIAGNOSIS — F419 Anxiety disorder, unspecified: Secondary | ICD-10-CM

## 2016-10-31 DIAGNOSIS — E871 Hypo-osmolality and hyponatremia: Secondary | ICD-10-CM | POA: Diagnosis not present

## 2016-10-31 LAB — GASTROINTESTINAL PANEL BY PCR, STOOL (REPLACES STOOL CULTURE)

## 2016-10-31 LAB — COMPREHENSIVE METABOLIC PANEL
ALT: 18 U/L (ref 14–54)
AST: 25 U/L (ref 15–41)
Albumin: 2.8 g/dL — ABNORMAL LOW (ref 3.5–5.0)
Alkaline Phosphatase: 60 U/L (ref 38–126)
Anion gap: 8 (ref 5–15)
BUN: 10 mg/dL (ref 6–20)
CO2: 26 mmol/L (ref 22–32)
Calcium: 8.6 mg/dL — ABNORMAL LOW (ref 8.9–10.3)
Chloride: 105 mmol/L (ref 101–111)
Creatinine, Ser: 0.88 mg/dL (ref 0.44–1.00)
GFR calc Af Amer: >60 mL/min (ref >60)
GFR calc non Af Amer: >60 mL/min (ref >60)
Glucose, Bld: 110 mg/dL — ABNORMAL HIGH (ref 65–99)
Potassium: 4.1 mmol/L (ref 3.5–5.1)
Sodium: 139 mmol/L (ref 135–145)
Total Bilirubin: 0.6 mg/dL (ref 0.3–1.2)
Total Protein: 5.1 g/dL — ABNORMAL LOW (ref 6.5–8.1)

## 2016-10-31 LAB — CBC
HCT: 33.8 % — ABNORMAL LOW (ref 36.0–46.0)
Hemoglobin: 11.3 g/dL — ABNORMAL LOW (ref 12.0–15.0)
MCH: 30.2 pg (ref 26.0–34.0)
MCHC: 33.4 g/dL (ref 30.0–36.0)
MCV: 90.4 fL (ref 78.0–100.0)
Platelets: 276 10*3/uL (ref 150–400)
RBC: 3.74 MIL/uL — ABNORMAL LOW (ref 3.87–5.11)
RDW: 14 % (ref 11.5–15.5)
WBC: 7.5 10*3/uL (ref 4.0–10.5)

## 2016-10-31 LAB — LACTOFERRIN, FECAL, QUALITATIVE: Lactoferrin, Fecal, Qual: POSITIVE — AB

## 2016-10-31 LAB — C DIFFICILE QUICK SCREEN W PCR REFLEX
C Diff antigen: NEGATIVE
C Diff interpretation: NOT DETECTED
C Diff toxin: NEGATIVE

## 2016-10-31 MED ORDER — FAMOTIDINE IN NACL 20-0.9 MG/50ML-% IV SOLN
20.0000 mg | Freq: Two times a day (BID) | INTRAVENOUS | Status: DC
  Administered 2016-10-31 – 2016-11-03 (×6): 20 mg via INTRAVENOUS
  Filled 2016-10-31 (×7): qty 50

## 2016-10-31 MED ORDER — GI COCKTAIL ~~LOC~~
30.0000 mL | Freq: Once | ORAL | Status: AC
  Administered 2016-10-31: 30 mL via ORAL
  Filled 2016-10-31: qty 30

## 2016-10-31 MED ORDER — ALUM & MAG HYDROXIDE-SIMETH 200-200-20 MG/5ML PO SUSP
30.0000 mL | Freq: Once | ORAL | Status: AC
  Administered 2016-10-31: 30 mL via ORAL
  Filled 2016-10-31: qty 30

## 2016-10-31 NOTE — Progress Notes (Signed)
Patient states that she has a burning sensation in her upper abdomen, also states she can feel it in the back of her throat, along with a log of gas and belching. Her stomach is round and distended with active bowel sounds. MD paged, nurse will continue to monitor

## 2016-10-31 NOTE — Progress Notes (Signed)
[facility]                                                                              Patient Demographics  Linda Jefferson, is a 71 y.o. female, DOB - 1945-12-27, ZOX:[id number]  Admit date - 10/29/2016   Admitting Physician Eduard Clos, MD  Outpatient Primary MD for the patient is Willow Ora, MD  Outpatient specialists:   LOS - 1  days    Chief Complaint  Patient presents with  . Abdominal Pain       Brief summary   Patient is a 71 year old female with hypertension, hyperlipidemia,  anxiety, previous colitis after colonoscopy, presented with crampy lower abdominal pain with multiple watery bloody diarrhea. Patient had reported eating a salad with tuna the previous night with increased belching sensation after that. No fevers. She did report taking Z-Pak in the last 3 months.  CT abdomen showed colitis in the transverse and descending colon  Assessment & Plan    Principal Problem:  Colitis -Patient presents with sudden onset of abdominal pain and bloody diarrhea with CT evidence of colitis involving the transverse and ascending colon -Patient placed on clear liquid diet, IV fluids, Empiric Cipro and Flagyl IV -Follow GI pathogen panel, fecal lactoferrin, C. difficile panel - Does not likely liquid diet, will try to advance to full liquid diet for supper.  Active Problems:   Dehydration with hyponatremia, lactic acidosis -Secondary to frequent diarrheal stools -Continue IV fluid hydration    HTN (hypertension) -Blood pressure elevated -Continue home medications-patient reports typically takes at bedtime    Hyperlipemia -Hold statin until diet advanced to solid    Anxiety -Continue preadmission clonazepam   Code Status: full  DVT Prophylaxis:   SCD's Family Communication: Discussed in detail with the patient, all imaging results, lab results explained to the patient   Disposition Plan:   Time Spent in minutes  25  minutes  Procedures:    Consultants:     Antimicrobials:      Medications  Scheduled Meds: . amLODipine  5 mg Oral Daily   And  . benazepril  10 mg Oral Daily  . ciprofloxacin  400 mg Intravenous Q12H  . finasteride  5 mg Oral Daily  . loratadine  10 mg Oral Daily  . metronidazole  500 mg Intravenous Q8H  . raloxifene  60 mg Oral Daily   Continuous Infusions: . 0.9 % NaCl with KCl 20 mEq / L 125 mL/hr at 10/30/16 2300   PRN Meds:.acetaminophen **OR** acetaminophen, clonazePAM, morphine injection, ondansetron (ZOFRAN) IV **OR** promethazine   Antibiotics   Anti-infectives    Start     Dose/Rate Route Frequency Ordered Stop   10/30/16 1600  ciprofloxacin (CIPRO) IVPB 400 mg     400 mg 200 mL/hr over 60 Minutes Intravenous Every 12 hours 10/30/16 0712     10/30/16 1200  metroNIDAZOLE (FLAGYL) IVPB 500 mg     500 mg 100 mL/hr over 60 Minutes Intravenous Every 8 hours 10/30/16 0712     10/30/16 0230  ciprofloxacin (CIPRO) IVPB 400 mg     400 mg 200 mL/hr over 60 Minutes Intravenous  Once 10/30/16 0226 10/30/16 0524   10/30/16 0230  metroNIDAZOLE (FLAGYL) IVPB 500 mg     500 mg 100 mL/hr over 60 Minutes Intravenous  Once 10/30/16 0226 10/30/16 0424        Subjective:   Linda Jefferson was seen and examined today. No nausea or vomiting, abdominal pain is improving. Per patient, since admission, diarrhea has been improving. Patient denies dizziness, chest pain, shortness of breath,  new weakness, numbess, tingling. No acute events overnight.    Objective:   Vitals:   10/30/16 0500 10/30/16 1540 10/30/16 2100 10/31/16 0445  BP: (!) 161/82 (!) 157/61 (!) 155/72 137/63  Pulse: 88 81 84 80  Resp: 18 18 18    Temp: 98 F (36.7 C) 99.1 F (37.3 C) 98.7 F (37.1 C) 98.5 F (36.9 C)  TempSrc: Oral Oral Oral Oral  SpO2: 98% 99% 99% 99%  Weight:      Height:        Intake/Output Summary (Last 24 hours) at 10/31/16 1127 Last data filed at 10/31/16 0900  Gross  per 24 hour  Intake           2047.5 ml  Output                0 ml  Net           2047.5 ml     Wt Readings from Last 3 Encounters:  10/29/16 61.7 kg (136 lb)  07/05/16 62.8 kg (138 lb 8 oz)  09/14/15 60.8 kg (134 lb)     Exam  General: Alert and oriented x 3, NAD  HEENT:    Neck: Supple, no JVD  Cardiovascular: S1 S2 auscultated, no rubs, murmurs or gallops. Regular rate and rhythm.  Respiratory: Clear to auscultation bilaterally, no wheezing, rales or rhonchi  Gastrointestinal: Soft, mild diffuse tenderness to deep palpation, nondistended, + bowel sounds  Ext: no cyanosis clubbing or edema  Neuro: AAOx3, Cr N's II- XII. Strength 5/5 upper and lower extremities bilaterally  Skin: No rashes  Psych: Normal affect and demeanor, alert and oriented x3    Data Reviewed:  I have personally reviewed following labs and imaging studies  Micro Results No results found for this or any previous visit (from the past 240 hour(s)).  Radiology Reports Ct Abdomen Pelvis W Contrast  Result Date: 10/30/2016 CLINICAL DATA:  71 year old female with lower abdominal pain and diarrhea. EXAM: CT ABDOMEN AND PELVIS WITH CONTRAST TECHNIQUE: Multidetector CT imaging of the abdomen and pelvis was performed using the standard protocol following bolus administration of intravenous contrast. CONTRAST:  ISOVUE-300 IOPAMIDOL (ISOVUE-300) INJECTION 61% COMPARISON:  Abdominal CT dated 04/30/2008 FINDINGS: Lower chest: Minimal bibasilar linear atelectasis/ scarring. The visualized lung bases are otherwise clear. No intra-abdominal free air.  No free fluid. Hepatobiliary: Cholecystectomy. Mild biliary ductal dilatation likely post cholecystectomy. There is normal tapering of the CBD at the head of the pancreas. There is mild irregularity of the hepatic contour which may represent early changes of cirrhosis. Clinical correlation is recommended. There is a 2.8 cm left hepatic cyst larger since the prior  CT of 2009. Subcentimeter right hepatic hypodense lesion is too small to characterize but appears similar to prior CT and likely represents a cyst. Pancreas: Unremarkable. No pancreatic ductal dilatation or surrounding inflammatory changes. Spleen: Normal in size without focal abnormality. Adrenals/Urinary Tract: The adrenal glands appear unremarkable. There is extrarenal pelvis with mild pelviectasis bilaterally. No hydronephrosis. The visualized ureters and urinary bladder appear unremarkable. Subcentimeter right  renal hypodense lesion is too small to characterize but likely represents a cyst. Stomach/Bowel: Oral contrast is noted within the stomach and multiple loops of small bowel without evidence of obstruction. There is inflammatory changes and circumferential thickening of the distal half of the transverse colon and entire descending colon compatible with colitis. Loose stool noted within the rectosigmoid compatible with diarrheal state. Correlation with clinical exam and stool cultures recommended. A small sigmoid diverticula noted without active inflammation. The appendix is normal. Vascular/Lymphatic: There is mild aortoiliac atherosclerotic disease. The origins of the celiac axis, SMA, IMA as well as the origins of the renal arteries are patent. The SMV, splenic vein, and the main portal vein are patent. No portal venous gas identified. There is no adenopathy. Reproductive: There is a small calcified anterior uterine body fibroid. The left ovary is unremarkable. The right ovary is not well visualized. No pelvic mass noted. Other: None Musculoskeletal: There is degenerative changes of the spine. No acute fracture. IMPRESSION: Colitis primarily involving the transverse and descending colon likely infectious or inflammatory in etiology. Ischemic colitis is less likely. Correlation with clinical exam and stool cultures recommended. No bowel obstruction. Normal appendix. Slight irregularity of the liver  contour may represent early changes of cirrhosis. Clinical correlation is recommended. Electronically Signed   By: Elgie CollardArash  Radparvar M.D.   On: 10/30/2016 02:01    Lab Data:  CBC:  Recent Labs Lab 10/29/16 2355 10/31/16 0422  WBC 11.1* 7.5  NEUTROABS 10.0*  --   HGB 13.2 11.3*  HCT 38.0 33.8*  MCV 88.2 90.4  PLT 351 276   Basic Metabolic Panel:  Recent Labs Lab 10/29/16 2355 10/30/16 0745 10/31/16 0422  NA 129* 138 139  K 3.5 3.4* 4.1  CL 97* 103 105  CO2 22 26 26   GLUCOSE 138* 110* 110*  BUN 23* 11 10  CREATININE 0.79 0.78 0.88  CALCIUM 9.6 9.3 8.6*   GFR: Estimated Creatinine Clearance: 50.6 mL/min (by C-G formula based on SCr of 0.88 mg/dL). Liver Function Tests:  Recent Labs Lab 10/29/16 2355 10/30/16 0745 10/31/16 0422  AST 37 37 25  ALT 23 23 18   ALKPHOS 83 78 60  BILITOT 0.7 0.8 0.6  PROT 7.0 6.4* 5.1*  ALBUMIN 4.0 3.6 2.8*   No results for input(s): LIPASE, AMYLASE in the last 168 hours. No results for input(s): AMMONIA in the last 168 hours. Coagulation Profile: No results for input(s): INR, PROTIME in the last 168 hours. Cardiac Enzymes: No results for input(s): CKTOTAL, CKMB, CKMBINDEX, TROPONINI in the last 168 hours. BNP (last 3 results) No results for input(s): PROBNP in the last 8760 hours. HbA1C: No results for input(s): HGBA1C in the last 72 hours. CBG: No results for input(s): GLUCAP in the last 168 hours. Lipid Profile: No results for input(s): CHOL, HDL, LDLCALC, TRIG, CHOLHDL, LDLDIRECT in the last 72 hours. Thyroid Function Tests: No results for input(s): TSH, T4TOTAL, FREET4, T3FREE, THYROIDAB in the last 72 hours. Anemia Panel: No results for input(s): VITAMINB12, FOLATE, FERRITIN, TIBC, IRON, RETICCTPCT in the last 72 hours. Urine analysis:    Component Value Date/Time   COLORURINE YELLOW 10/29/2016 2040   APPEARANCEUR CLEAR 10/29/2016 2040   LABSPEC 1.019 10/29/2016 2040   PHURINE 5.5 10/29/2016 2040   GLUCOSEU  NEGATIVE 10/29/2016 2040   HGBUR NEGATIVE 10/29/2016 2040   BILIRUBINUR NEGATIVE 10/29/2016 2040   KETONESUR 15 (A) 10/29/2016 2040   PROTEINUR NEGATIVE 10/29/2016 2040   NITRITE NEGATIVE 10/29/2016 2040   LEUKOCYTESUR NEGATIVE  10/29/2016 2040     Linda Jefferson M.D. [facility] 10/31/2016, 11:27 AM  Pager: [phone] Between 7am to 7pm - call Pager - [phone]  After 7pm go to [email or web address] - password [email or web address]  Call night coverage person covering after 7pm

## 2016-11-01 DIAGNOSIS — Z79899 Other long term (current) drug therapy: Secondary | ICD-10-CM | POA: Diagnosis not present

## 2016-11-01 DIAGNOSIS — K219 Gastro-esophageal reflux disease without esophagitis: Secondary | ICD-10-CM | POA: Diagnosis not present

## 2016-11-01 DIAGNOSIS — Z91048 Other nonmedicinal substance allergy status: Secondary | ICD-10-CM | POA: Diagnosis not present

## 2016-11-01 DIAGNOSIS — E872 Acidosis: Secondary | ICD-10-CM | POA: Diagnosis not present

## 2016-11-01 DIAGNOSIS — Z91013 Allergy to seafood: Secondary | ICD-10-CM | POA: Diagnosis not present

## 2016-11-01 DIAGNOSIS — Z8249 Family history of ischemic heart disease and other diseases of the circulatory system: Secondary | ICD-10-CM | POA: Diagnosis not present

## 2016-11-01 DIAGNOSIS — F419 Anxiety disorder, unspecified: Secondary | ICD-10-CM | POA: Diagnosis not present

## 2016-11-01 DIAGNOSIS — Z7982 Long term (current) use of aspirin: Secondary | ICD-10-CM | POA: Diagnosis not present

## 2016-11-01 DIAGNOSIS — I10 Essential (primary) hypertension: Secondary | ICD-10-CM | POA: Diagnosis not present

## 2016-11-01 DIAGNOSIS — Z888 Allergy status to other drugs, medicaments and biological substances status: Secondary | ICD-10-CM | POA: Diagnosis not present

## 2016-11-01 DIAGNOSIS — Z79810 Long term (current) use of selective estrogen receptor modulators (SERMs): Secondary | ICD-10-CM | POA: Diagnosis not present

## 2016-11-01 DIAGNOSIS — E871 Hypo-osmolality and hyponatremia: Secondary | ICD-10-CM | POA: Diagnosis not present

## 2016-11-01 DIAGNOSIS — Z91012 Allergy to eggs: Secondary | ICD-10-CM | POA: Diagnosis not present

## 2016-11-01 DIAGNOSIS — Z91018 Allergy to other foods: Secondary | ICD-10-CM | POA: Diagnosis not present

## 2016-11-01 DIAGNOSIS — Z974 Presence of external hearing-aid: Secondary | ICD-10-CM | POA: Diagnosis not present

## 2016-11-01 DIAGNOSIS — E785 Hyperlipidemia, unspecified: Secondary | ICD-10-CM | POA: Diagnosis not present

## 2016-11-01 DIAGNOSIS — E860 Dehydration: Secondary | ICD-10-CM | POA: Diagnosis not present

## 2016-11-01 DIAGNOSIS — K529 Noninfective gastroenteritis and colitis, unspecified: Secondary | ICD-10-CM | POA: Diagnosis present

## 2016-11-01 LAB — CBC
HCT: 30.4 % — ABNORMAL LOW (ref 36.0–46.0)
Hemoglobin: 9.9 g/dL — ABNORMAL LOW (ref 12.0–15.0)
MCH: 29.7 pg (ref 26.0–34.0)
MCHC: 32.6 g/dL (ref 30.0–36.0)
MCV: 91.3 fL (ref 78.0–100.0)
Platelets: 224 10*3/uL (ref 150–400)
RBC: 3.33 MIL/uL — ABNORMAL LOW (ref 3.87–5.11)
RDW: 14.2 % (ref 11.5–15.5)
WBC: 5.2 10*3/uL (ref 4.0–10.5)

## 2016-11-01 LAB — BASIC METABOLIC PANEL
Anion gap: 5 (ref 5–15)
BUN: 6 mg/dL (ref 6–20)
CO2: 25 mmol/L (ref 22–32)
Calcium: 8.4 mg/dL — ABNORMAL LOW (ref 8.9–10.3)
Chloride: 108 mmol/L (ref 101–111)
Creatinine, Ser: 0.72 mg/dL (ref 0.44–1.00)
GFR calc Af Amer: >60 mL/min (ref >60)
GFR calc non Af Amer: >60 mL/min (ref >60)
Glucose, Bld: 93 mg/dL (ref 65–99)
Potassium: 3.6 mmol/L (ref 3.5–5.1)
Sodium: 138 mmol/L (ref 135–145)

## 2016-11-01 LAB — PROCALCITONIN: Procalcitonin: <0.1 ng/mL

## 2016-11-01 MED ORDER — BENAZEPRIL HCL 5 MG PO TABS
10.0000 mg | ORAL_TABLET | Freq: Every day | ORAL | Status: DC
  Administered 2016-11-01 – 2016-11-02 (×2): 10 mg via ORAL
  Filled 2016-11-01 (×2): qty 2

## 2016-11-01 MED ORDER — AMLODIPINE BESY-BENAZEPRIL HCL 5-10 MG PO CAPS: 1.0000 | ORAL_CAPSULE | Freq: Every day | ORAL | Status: DC

## 2016-11-01 MED ORDER — AMLODIPINE BESYLATE 5 MG PO TABS
5.0000 mg | ORAL_TABLET | Freq: Every day | ORAL | Status: DC
  Administered 2016-11-01 – 2016-11-02 (×2): 5 mg via ORAL
  Filled 2016-11-01 (×2): qty 1

## 2016-11-01 MED ORDER — SIMETHICONE 80 MG PO CHEW
80.0000 mg | CHEWABLE_TABLET | Freq: Four times a day (QID) | ORAL | Status: DC
  Administered 2016-11-01 – 2016-11-03 (×9): 80 mg via ORAL
  Filled 2016-11-01 (×8): qty 1

## 2016-11-01 MED ORDER — BENAZEPRIL HCL 5 MG PO TABS: 10.0000 mg | ORAL_TABLET | Freq: Every day | ORAL | Status: DC

## 2016-11-01 MED ORDER — AMLODIPINE BESYLATE 5 MG PO TABS: 5.0000 mg | ORAL_TABLET | Freq: Every day | ORAL | Status: DC

## 2016-11-01 NOTE — Progress Notes (Signed)
[facility]                                                                              Patient Demographics  Linda Jefferson, is a 71 y.o. female, DOB - 09-17-1946, [id number]  Admit date - 10/29/2016   Admitting Physician Eduard ClosArshad N Kakrakandy, MD  Outpatient Primary MD for the patient is Willow OraJose Paz, MD  Outpatient specialists:   LOS - 1  days    Chief Complaint  Patient presents with  . Abdominal Pain       Brief summary   Patient is a 71 year old female with hypertension, hyperlipidemia,  anxiety, previous colitis after colonoscopy, presented with crampy lower abdominal pain with multiple watery bloody diarrhea. Patient had reported eating a salad with tuna the previous night with increased belching sensation after that. No fevers. She did report taking Z-Pak in the last 3 months.  CT abdomen showed colitis in the transverse and descending colon  Assessment & Plan    Principal Problem:  Colitis - Patient presented with sudden onset of abdominal pain and bloody diarrhea with CT evidence of colitis involving the transverse and ascending colon - Continue full liquids, IV fluids, Empiric Cipro and Flagyl IV - C. difficile negative, Escherichia coli negative, Salmonella Campylobacter pending  - fecal lactoferrin positive   Active Problems:   Dehydration with hyponatremia, lactic acidosis - diarrhea improving -Continue IV fluid hydration  GERD - Added Pepcid IV with the simethicone for gas     HTN (hypertension) -Blood pressure elevated -Continue home medications-patient reports typically takes at bedtime    Hyperlipemia -Hold statin until diet advanced to solid    Anxiety -Continue preadmission clonazepam   Code Status: full  DVT Prophylaxis:   SCD's Family Communication: Discussed in detail with the patient, all imaging results, lab results explained to the patient   Disposition Plan:   Time Spent in minutes  25 minutes  Procedures:      Consultants:     Antimicrobials:      Medications  Scheduled Meds: . amLODipine  5 mg Oral Daily   And  . benazepril  10 mg Oral Daily  . ciprofloxacin  400 mg Intravenous Q12H  . famotidine (PEPCID) IV  20 mg Intravenous Q12H  . finasteride  5 mg Oral Daily  . loratadine  10 mg Oral Daily  . metronidazole  500 mg Intravenous Q8H  . raloxifene  60 mg Oral Daily  . simethicone  80 mg Oral QID   Continuous Infusions: . 0.9 % NaCl with KCl 20 mEq / L 100 mL/hr at 11/01/16 1040   PRN Meds:.acetaminophen **OR** acetaminophen, clonazePAM, morphine injection, ondansetron (ZOFRAN) IV **OR** promethazine   Antibiotics   Anti-infectives    Start     Dose/Rate Route Frequency Ordered Stop   10/30/16 1600  ciprofloxacin (CIPRO) IVPB 400 mg     400 mg 200 mL/hr over 60 Minutes Intravenous Every 12 hours 10/30/16 0712     10/30/16 1200  metroNIDAZOLE (FLAGYL) IVPB 500 mg     500 mg 100 mL/hr over 60 Minutes Intravenous Every 8 hours 10/30/16 0712     10/30/16  0230  ciprofloxacin (CIPRO) IVPB 400 mg     400 mg 200 mL/hr over 60 Minutes Intravenous  Once 10/30/16 0226 10/30/16 0524   10/30/16 0230  metroNIDAZOLE (FLAGYL) IVPB 500 mg     500 mg 100 mL/hr over 60 Minutes Intravenous  Once 10/30/16 0226 10/30/16 0424        Subjective:   Larry Knipp was seen and examined today. No nausea or vomiting. States abd has lot of gas and churning. Diarrhea still watery but no blood.  Patient denies dizziness, chest pain, shortness of breath,  new weakness, numbess, tingling. No acute events overnight.    Objective:   Vitals:   10/31/16 2050 10/31/16 2237 11/01/16 0455 11/01/16 1223  BP: (!) 172/78 124/63 112/61 (!) 144/60  Pulse: 78  77 70  Resp: 18  16 17   Temp: 98.6 F (37 C)  98.6 F (37 C) 98.3 F (36.8 C)  TempSrc: Oral  Oral Oral  SpO2: 99%  97% 100%  Weight:      Height:        Intake/Output Summary (Last 24 hours) at 11/01/16 1309 Last data filed at  10/31/16 1700  Gross per 24 hour  Intake              360 ml  Output                0 ml  Net              360 ml     Wt Readings from Last 3 Encounters:  10/29/16 61.7 kg (136 lb)  07/05/16 62.8 kg (138 lb 8 oz)  09/14/15 60.8 kg (134 lb)     Exam  General: Alert and oriented x 3, NAD  HEENT:    Neck: Supple, no JVD  Cardiovascular: S1 S2clear, RRR  Respiratory: CTAB  Gastrointestinal: Soft, NT, mildly distended, + bowel sounds  Ext: no cyanosis clubbing or edema  Neuro: no new deficits    Skin: No rashes  Psych: Normal affect and demeanor, alert and oriented x3    Data Reviewed:  I have personally reviewed following labs and imaging studies  Micro Results Recent Results (from the past 240 hour(s))  Gastrointestinal Panel by PCR , Stool     Status: None   Collection Time: 10/30/16 11:15 AM  Result Value Ref Range Status   Campylobacter species NOT DETECTED NOT DETECTED Final   Plesimonas shigelloides NOT DETECTED NOT DETECTED Final   Salmonella species NOT DETECTED NOT DETECTED Final   Yersinia enterocolitica NOT DETECTED NOT DETECTED Final   Vibrio species NOT DETECTED NOT DETECTED Final   Vibrio cholerae NOT DETECTED NOT DETECTED Final   Enteroaggregative E coli (EAEC) NOT DETECTED NOT DETECTED Final   Enteropathogenic E coli (EPEC) NOT DETECTED NOT DETECTED Final   Enterotoxigenic E coli (ETEC) NOT DETECTED NOT DETECTED Final   Shiga like toxin producing E coli (STEC) NOT DETECTED NOT DETECTED Final   Shigella/Enteroinvasive E coli (EIEC) NOT DETECTED NOT DETECTED Final   Cryptosporidium NOT DETECTED NOT DETECTED Final   Cyclospora cayetanensis NOT DETECTED NOT DETECTED Final   Entamoeba histolytica NOT DETECTED NOT DETECTED Final   Giardia lamblia NOT DETECTED NOT DETECTED Final   Adenovirus F40/41 NOT DETECTED NOT DETECTED Final   Astrovirus NOT DETECTED NOT DETECTED Final   Norovirus GI/GII NOT DETECTED NOT DETECTED Final   Rotavirus A NOT  DETECTED NOT DETECTED Final   Sapovirus (I, II, IV, and V) NOT DETECTED NOT  DETECTED Final  Stool culture (children & immunocomp patients)     Status: None (Preliminary result)   Collection Time: 10/30/16  4:01 PM  Result Value Ref Range Status   Salmonella/Shigella Screen PENDING  Incomplete   Campylobacter Culture PENDING  Incomplete   E coli, Shiga toxin Assay Negative Negative Final    Comment: (NOTE) Performed At: [facility] [address] [place], [place] [id number] Mila Homer MD MV:[id number]   C difficile quick scan w PCR reflex     Status: None   Collection Time: 10/31/16  7:46 AM  Result Value Ref Range Status   C Diff antigen NEGATIVE NEGATIVE Final   C Diff toxin NEGATIVE NEGATIVE Final   C Diff interpretation No C. difficile detected.  Final    Radiology Reports Ct Abdomen Pelvis W Contrast  Result Date: 10/30/2016 CLINICAL DATA:  71 year old female with lower abdominal pain and diarrhea. EXAM: CT ABDOMEN AND PELVIS WITH CONTRAST TECHNIQUE: Multidetector CT imaging of the abdomen and pelvis was performed using the standard protocol following bolus administration of intravenous contrast. CONTRAST:  ISOVUE-300 IOPAMIDOL (ISOVUE-300) INJECTION 61% COMPARISON:  Abdominal CT dated 04/30/2008 FINDINGS: Lower chest: Minimal bibasilar linear atelectasis/ scarring. The visualized lung bases are otherwise clear. No intra-abdominal free air.  No free fluid. Hepatobiliary: Cholecystectomy. Mild biliary ductal dilatation likely post cholecystectomy. There is normal tapering of the CBD at the head of the pancreas. There is mild irregularity of the hepatic contour which may represent early changes of cirrhosis. Clinical correlation is recommended. There is a 2.8 cm left hepatic cyst larger since the prior CT of 2009. Subcentimeter right hepatic hypodense lesion is too small to characterize but appears similar to prior CT and likely represents a cyst. Pancreas:  Unremarkable. No pancreatic ductal dilatation or surrounding inflammatory changes. Spleen: Normal in size without focal abnormality. Adrenals/Urinary Tract: The adrenal glands appear unremarkable. There is extrarenal pelvis with mild pelviectasis bilaterally. No hydronephrosis. The visualized ureters and urinary bladder appear unremarkable. Subcentimeter right renal hypodense lesion is too small to characterize but likely represents a cyst. Stomach/Bowel: Oral contrast is noted within the stomach and multiple loops of small bowel without evidence of obstruction. There is inflammatory changes and circumferential thickening of the distal half of the transverse colon and entire descending colon compatible with colitis. Loose stool noted within the rectosigmoid compatible with diarrheal state. Correlation with clinical exam and stool cultures recommended. A small sigmoid diverticula noted without active inflammation. The appendix is normal. Vascular/Lymphatic: There is mild aortoiliac atherosclerotic disease. The origins of the celiac axis, SMA, IMA as well as the origins of the renal arteries are patent. The SMV, splenic vein, and the main portal vein are patent. No portal venous gas identified. There is no adenopathy. Reproductive: There is a small calcified anterior uterine body fibroid. The left ovary is unremarkable. The right ovary is not well visualized. No pelvic mass noted. Other: None Musculoskeletal: There is degenerative changes of the spine. No acute fracture. IMPRESSION: Colitis primarily involving the transverse and descending colon likely infectious or inflammatory in etiology. Ischemic colitis is less likely. Correlation with clinical exam and stool cultures recommended. No bowel obstruction. Normal appendix. Slight irregularity of the liver contour may represent early changes of cirrhosis. Clinical correlation is recommended. Electronically Signed   By: Elgie Collard M.D.   On: 10/30/2016 02:01     Lab Data:  CBC:  Recent Labs Lab 10/29/16 2355 10/31/16 0422 11/01/16 0541  WBC 11.1* 7.5 5.2  NEUTROABS  10.0*  --   --   HGB 13.2 11.3* 9.9*  HCT 38.0 33.8* 30.4*  MCV 88.2 90.4 91.3  PLT 351 276 224   Basic Metabolic Panel:  Recent Labs Lab 10/29/16 2355 10/30/16 0745 10/31/16 0422 11/01/16 0541  NA 129* 138 139 138  K 3.5 3.4* 4.1 3.6  CL 97* 103 105 108  CO2 22 26 26 25   GLUCOSE 138* 110* 110* 93  BUN 23* 11 10 6   CREATININE 0.79 0.78 0.88 0.72  CALCIUM 9.6 9.3 8.6* 8.4*   GFR: Estimated Creatinine Clearance: 55.7 mL/min (by C-G formula based on SCr of 0.72 mg/dL). Liver Function Tests:  Recent Labs Lab 10/29/16 2355 10/30/16 0745 10/31/16 0422  AST 37 37 25  ALT 23 23 18   ALKPHOS 83 78 60  BILITOT 0.7 0.8 0.6  PROT 7.0 6.4* 5.1*  ALBUMIN 4.0 3.6 2.8*   No results for input(s): LIPASE, AMYLASE in the last 168 hours. No results for input(s): AMMONIA in the last 168 hours. Coagulation Profile: No results for input(s): INR, PROTIME in the last 168 hours. Cardiac Enzymes: No results for input(s): CKTOTAL, CKMB, CKMBINDEX, TROPONINI in the last 168 hours. BNP (last 3 results) No results for input(s): PROBNP in the last 8760 hours. HbA1C: No results for input(s): HGBA1C in the last 72 hours. CBG: No results for input(s): GLUCAP in the last 168 hours. Lipid Profile: No results for input(s): CHOL, HDL, LDLCALC, TRIG, CHOLHDL, LDLDIRECT in the last 72 hours. Thyroid Function Tests: No results for input(s): TSH, T4TOTAL, FREET4, T3FREE, THYROIDAB in the last 72 hours. Anemia Panel: No results for input(s): VITAMINB12, FOLATE, FERRITIN, TIBC, IRON, RETICCTPCT in the last 72 hours. Urine analysis:    Component Value Date/Time   COLORURINE YELLOW 10/29/2016 2040   APPEARANCEUR CLEAR 10/29/2016 2040   LABSPEC 1.019 10/29/2016 2040   PHURINE 5.5 10/29/2016 2040   GLUCOSEU NEGATIVE 10/29/2016 2040   HGBUR NEGATIVE 10/29/2016 2040   BILIRUBINUR  NEGATIVE 10/29/2016 2040   KETONESUR 15 (A) 10/29/2016 2040   PROTEINUR NEGATIVE 10/29/2016 2040   NITRITE NEGATIVE 10/29/2016 2040   LEUKOCYTESUR NEGATIVE 10/29/2016 2040     [place] M.D. [facility] 11/01/2016, 1:09 PM  Pager: [phone] Between 7am to 7pm - call Pager - [phone]  After 7pm go to [email or web address] - password [email or web address]  Call night coverage person covering after 7pm

## 2016-11-02 DIAGNOSIS — K529 Noninfective gastroenteritis and colitis, unspecified: Principal | ICD-10-CM

## 2016-11-02 LAB — CBC
HCT: 31.9 % — ABNORMAL LOW (ref 36.0–46.0)
Hemoglobin: 10.4 g/dL — ABNORMAL LOW (ref 12.0–15.0)
MCH: 29.6 pg (ref 26.0–34.0)
MCHC: 32.6 g/dL (ref 30.0–36.0)
MCV: 90.9 fL (ref 78.0–100.0)
Platelets: 233 10*3/uL (ref 150–400)
RBC: 3.51 MIL/uL — ABNORMAL LOW (ref 3.87–5.11)
RDW: 14.2 % (ref 11.5–15.5)
WBC: 3.8 10*3/uL — ABNORMAL LOW (ref 4.0–10.5)

## 2016-11-02 LAB — BASIC METABOLIC PANEL
Anion gap: 10 (ref 5–15)
BUN: <5 mg/dL — ABNORMAL LOW (ref 6–20)
CO2: 23 mmol/L (ref 22–32)
Calcium: 9 mg/dL (ref 8.9–10.3)
Chloride: 105 mmol/L (ref 101–111)
Creatinine, Ser: 0.68 mg/dL (ref 0.44–1.00)
GFR calc Af Amer: >60 mL/min (ref >60)
GFR calc non Af Amer: >60 mL/min (ref >60)
Glucose, Bld: 90 mg/dL (ref 65–99)
Potassium: 3.7 mmol/L (ref 3.5–5.1)
Sodium: 138 mmol/L (ref 135–145)

## 2016-11-02 MED ORDER — SACCHAROMYCES BOULARDII 250 MG PO CAPS
250.0000 mg | ORAL_CAPSULE | Freq: Two times a day (BID) | ORAL | Status: DC
  Administered 2016-11-02 – 2016-11-03 (×3): 250 mg via ORAL
  Filled 2016-11-02 (×3): qty 1

## 2016-11-02 MED ORDER — SODIUM CHLORIDE 0.9 % IV SOLN
1.5000 g | Freq: Four times a day (QID) | INTRAVENOUS | Status: DC
  Administered 2016-11-02 – 2016-11-03 (×4): 1.5 g via INTRAVENOUS
  Filled 2016-11-02 (×6): qty 1.5

## 2016-11-02 NOTE — Progress Notes (Signed)
[facility]                                                                              Patient Demographics  Linda Jefferson, is a 71 y.o. female, DOB - April 29, 1946, SWN:[id number]  Admit date - 10/29/2016   Admitting Physician [name], MD  Outpatient Primary MD for the patient is [name], MD  Outpatient specialists:   LOS - 2  days    Chief Complaint  Patient presents with  . Abdominal Pain       Brief summary   Patient is a 71 year old female with hypertension, hyperlipidemia,  anxiety, previous colitis after colonoscopy, presented with crampy lower abdominal pain with multiple watery bloody diarrhea. Patient had reported eating a salad with tuna the previous night with increased belching sensation after that. No fevers. She did report taking Z-Pak in the last 3 months.  CT abdomen showed colitis in the transverse and descending colon  Assessment & Plan    Principal Problem:  Colitis - Patient presented with sudden onset of abdominal pain and bloody diarrhea with CT evidence of colitis involving the transverse and ascending colon - Change Cipro and Flagyl to IV Unasyn. Advance diet to soft diet and monitor. If the diarrhea does not improve we will consult GI tomorrow. ESR and CRP - C. difficile negative, Escherichia coli negative, Salmonella Campylobacter pending  - fecal lactoferrin positive   Active Problems:   Dehydration with hyponatremia, lactic acidosis - diarrhea improving -Continue IV fluid hydration  GERD - Added Pepcid IV with the simethicone for gas     HTN (hypertension) -Blood pressure elevated -Continue home medications-patient reports typically takes at bedtime    Hyperlipemia -Hold statin until diet advanced to solid    Anxiety -Continue preadmission clonazepam   Code Status: full  DVT Prophylaxis:   SCD's Family Communication: Discussed in detail with the patient, all imaging results, lab results explained  to the patient   Disposition Plan:   Time Spent in minutes  25 minutes  Procedures: none   Consultants:  none   Medications  Scheduled Meds: . amLODipine  5 mg Oral QHS   And  . benazepril  10 mg Oral Daily  . ampicillin-sulbactam (UNASYN) IV  1.5 g Intravenous Q6H  . famotidine (PEPCID) IV  20 mg Intravenous Q12H  . finasteride  5 mg Oral Daily  . loratadine  10 mg Oral Daily  . raloxifene  60 mg Oral Daily  . saccharomyces boulardii  250 mg Oral BID  . simethicone  80 mg Oral QID   Continuous Infusions: . 0.9 % NaCl with KCl 20 mEq / L 100 mL/hr at 11/02/16 1546   PRN Meds:.acetaminophen **OR** acetaminophen, clonazePAM, morphine injection, ondansetron (ZOFRAN) IV **OR** promethazine   Antibiotics   Anti-infectives    Start     Dose/Rate Route Frequency Ordered Stop   11/02/16 1200  ampicillin-sulbactam (UNASYN) 1.5 g in sodium chloride 0.9 % 50 mL IVPB     1.5 g 100 mL/hr over 30 Minutes Intravenous Every 6 hours 11/02/16 1113     10/30/16 1600  ciprofloxacin (CIPRO) IVPB 400 mg  Status:  Discontinued     400 mg 200 mL/hr over 60 Minutes Intravenous Every 12 hours 10/30/16 0712 11/02/16 1103   10/30/16 1200  metroNIDAZOLE (FLAGYL) IVPB 500 mg  Status:  Discontinued     500 mg 100 mL/hr over 60 Minutes Intravenous Every 8 hours 10/30/16 0712 11/02/16 1103   10/30/16 0230  ciprofloxacin (CIPRO) IVPB 400 mg     400 mg 200 mL/hr over 60 Minutes Intravenous  Once 10/30/16 0226 10/30/16 0524   10/30/16 0230  metroNIDAZOLE (FLAGYL) IVPB 500 mg     500 mg 100 mL/hr over 60 Minutes Intravenous  Once 10/30/16 0226 10/30/16 0424        Subjective:   Still has watery diarrhea, abdominal pain. No fever no chills.  Objective:   Vitals:   11/01/16 1223 11/01/16 2004 11/02/16 0519 11/02/16 1546  BP: (!) 144/60 (!) 167/79 125/60 (!) 145/72  Pulse: 70 86 77 72  Resp: _0 Temp: 98.3 F (36.8 C) 98.3 F (36.8 C) 98.7 F (37.1 C) 98.4 F (36.9 C)    TempSrc: Oral Oral Oral Oral  SpO2: 100% 98% 96% 99%  Weight:      Height:        Intake/Output Summary (Last 24 hours) at 11/02/16 1635 Last data filed at 11/02/16 1430  Gross per 24 hour  Intake              720 ml  Output                0 ml  Net              720 ml     Wt Readings from Last 3 Encounters:  10/29/16 61.7 kg (136 lb)  07/05/16 62.8 kg (138 lb 8 oz)  09/14/15 60.8 kg (134 lb)     Exam  General: Alert and oriented x 3, NAD  HEENT:    Neck: Supple, no JVD  Cardiovascular: S1 S2clear, RRR  Respiratory: CTAB  Gastrointestinal: Soft, NT, mildly distended, + bowel sounds  Ext: no cyanosis clubbing or edema  Neuro: no new deficits    Skin: No rashes  Psych: Normal affect and demeanor, alert and oriented x3    Data Reviewed:  I have personally reviewed following labs and imaging studies  Micro Results Recent Results (from the past 240 hour(s))  Gastrointestinal Panel by PCR , Stool     Status: None   Collection Time: 10/30/16 11:15 AM  Result Value Ref Range Status   Campylobacter species NOT DETECTED NOT DETECTED Final   Plesimonas shigelloides NOT DETECTED NOT DETECTED Final   Salmonella species NOT DETECTED NOT DETECTED Final   Yersinia enterocolitica NOT DETECTED NOT DETECTED Final   Vibrio species NOT DETECTED NOT DETECTED Final   Vibrio cholerae NOT DETECTED NOT DETECTED Final   Enteroaggregative E coli (EAEC) NOT DETECTED NOT DETECTED Final   Enteropathogenic E coli (EPEC) NOT DETECTED NOT DETECTED Final   Enterotoxigenic E coli (ETEC) NOT DETECTED NOT DETECTED Final   Shiga like toxin producing E coli (STEC) NOT DETECTED NOT DETECTED Final   Shigella/Enteroinvasive E coli (EIEC) NOT DETECTED NOT DETECTED Final   Cryptosporidium NOT DETECTED NOT DETECTED Final   Cyclospora cayetanensis NOT DETECTED NOT DETECTED Final   Entamoeba histolytica NOT DETECTED NOT DETECTED Final   Giardia lamblia NOT DETECTED NOT DETECTED Final    Adenovirus F40/41 NOT DETECTED NOT DETECTED Final   Astrovirus NOT DETECTED NOT DETECTED Final  Norovirus GI/GII NOT DETECTED NOT DETECTED Final   Rotavirus A NOT DETECTED NOT DETECTED Final   Sapovirus (I, II, IV, and V) NOT DETECTED NOT DETECTED Final  Stool culture (children & immunocomp patients)     Status: None (Preliminary result)   Collection Time: 10/30/16  4:01 PM  Result Value Ref Range Status   Salmonella/Shigella Screen Final report  Final    Comment: (NOTE) Performed At: [facility] [place], [place] [id number] Lindon Romp MD [id number]    Campylobacter Culture PENDING  Incomplete   E coli, Shiga toxin Assay Negative Negative Final    Comment: (NOTE) Performed At: [facility] [place], [place] [id number] Lindon Romp MD [id number]   STOOL CULTURE REFLEX - RSASHR     Status: None   Collection Time: 10/30/16  4:01 PM  Result Value Ref Range Status   Stool Culture result 1 (RSASHR) Comment  Final    Comment: (NOTE) No Salmonella or Shigella recovered. Performed At: [facility] [place], [place] [id number] Lindon Romp MD [id number]   C difficile quick scan w PCR reflex     Status: None   Collection Time: 10/31/16  7:46 AM  Result Value Ref Range Status   C Diff antigen NEGATIVE NEGATIVE Final   C Diff toxin NEGATIVE NEGATIVE Final   C Diff interpretation No C. difficile detected.  Final    Radiology Reports Ct Abdomen Pelvis W Contrast  Result Date: 10/30/2016 CLINICAL DATA:  71 year old female with lower abdominal pain and diarrhea. EXAM: CT ABDOMEN AND PELVIS WITH CONTRAST TECHNIQUE: Multidetector CT imaging of the abdomen and pelvis was performed using the standard protocol following bolus administration of intravenous contrast. CONTRAST:  164m ISOVUE-300 IOPAMIDOL (ISOVUE-300) INJECTION 61% COMPARISON:  Abdominal CT dated 04/30/2008 FINDINGS: Lower chest: Minimal  bibasilar linear atelectasis/ scarring. The visualized lung bases are otherwise clear. No intra-abdominal free air.  No free fluid. Hepatobiliary: Cholecystectomy. Mild biliary ductal dilatation likely post cholecystectomy. There is normal tapering of the CBD at the head of the pancreas. There is mild irregularity of the hepatic contour which may represent early changes of cirrhosis. Clinical correlation is recommended. There is a 2.8 cm left hepatic cyst larger since the prior CT of 2009. Subcentimeter right hepatic hypodense lesion is too small to characterize but appears similar to prior CT and likely represents a cyst. Pancreas: Unremarkable. No pancreatic ductal dilatation or surrounding inflammatory changes. Spleen: Normal in size without focal abnormality. Adrenals/Urinary Tract: The adrenal glands appear unremarkable. There is extrarenal pelvis with mild pelviectasis bilaterally. No hydronephrosis. The visualized ureters and urinary bladder appear unremarkable. Subcentimeter right renal hypodense lesion is too small to characterize but likely represents a cyst. Stomach/Bowel: Oral contrast is noted within the stomach and multiple loops of small bowel without evidence of obstruction. There is inflammatory changes and circumferential thickening of the distal half of the transverse colon and entire descending colon compatible with colitis. Loose stool noted within the rectosigmoid compatible with diarrheal state. Correlation with clinical exam and stool cultures recommended. A small sigmoid diverticula noted without active inflammation. The appendix is normal. Vascular/Lymphatic: There is mild aortoiliac atherosclerotic disease. The origins of the celiac axis, SMA, IMA as well as the origins of the renal arteries are patent. The SMV, splenic vein, and the main portal vein are patent. No portal venous gas identified. There is no adenopathy. Reproductive: There is a small calcified anterior uterine body fibroid.  The left ovary  is unremarkable. The right ovary is not well visualized. No pelvic mass noted. Other: None Musculoskeletal: There is degenerative changes of the spine. No acute fracture. IMPRESSION: Colitis primarily involving the transverse and descending colon likely infectious or inflammatory in etiology. Ischemic colitis is less likely. Correlation with clinical exam and stool cultures recommended. No bowel obstruction. Normal appendix. Slight irregularity of the liver contour may represent early changes of cirrhosis. Clinical correlation is recommended. Electronically Signed   By: Anner Crete M.D.   On: 10/30/2016 02:01    Lab Data:  CBC:  Recent Labs Lab 10/29/16 2355 10/31/16 0422 11/01/16 0541 11/02/16 0752  WBC 11.1* 7.5 5.2 3.8*  NEUTROABS 10.0*  --   --   --   HGB 13.2 11.3* 9.9* 10.4*  HCT 38.0 33.8* 30.4* 31.9*  MCV 88.2 90.4 91.3 90.9  PLT 351 276 224 841   Basic Metabolic Panel:  Recent Labs Lab 10/29/16 2355 10/30/16 0745 10/31/16 0422 11/01/16 0541 11/02/16 0752  NA 129* 138 139 138 138  K 3.5 3.4* 4.1 3.6 3.7  CL 97* 103 105 108 105  CO2 _0 GLUCOSE 138* 110* 110* 93 90  BUN 23* _1 <5*  CREATININE 0.79 0.78 0.88 0.72 0.68  CALCIUM 9.6 9.3 8.6* 8.4* 9.0   GFR: Estimated Creatinine Clearance: 55.7 mL/min (by C-G formula based on SCr of 0.68 mg/dL). Liver Function Tests:  Recent Labs Lab 10/29/16 2355 10/30/16 0745 10/31/16 0422  AST 37 37 25  ALT _2 ALKPHOS 83 78 60  BILITOT 0.7 0.8 0.6  PROT 7.0 6.4* 5.1*  ALBUMIN 4.0 3.6 2.8*   No results for input(s): LIPASE, AMYLASE in the last 168 hours. No results for input(s): AMMONIA in the last 168 hours. Coagulation Profile: No results for input(s): INR, PROTIME in the last 168 hours. Cardiac Enzymes: No results for input(s): CKTOTAL, CKMB, CKMBINDEX, TROPONINI in the last 168 hours. BNP (last 3 results) No results for input(s): PROBNP in the last 8760  hours. HbA1C: No results for input(s): HGBA1C in the last 72 hours. CBG: No results for input(s): GLUCAP in the last 168 hours. Lipid Profile: No results for input(s): CHOL, HDL, LDLCALC, TRIG, CHOLHDL, LDLDIRECT in the last 72 hours. Thyroid Function Tests: No results for input(s): TSH, T4TOTAL, FREET4, T3FREE, THYROIDAB in the last 72 hours. Anemia Panel: No results for input(s): VITAMINB12, FOLATE, FERRITIN, TIBC, IRON, RETICCTPCT in the last 72 hours. Urine analysis:    Component Value Date/Time   COLORURINE YELLOW 10/29/2016 2040   APPEARANCEUR CLEAR 10/29/2016 2040   LABSPEC 1.019 10/29/2016 2040   PHURINE 5.5 10/29/2016 2040   GLUCOSEU NEGATIVE 10/29/2016 2040   HGBUR NEGATIVE 10/29/2016 2040   BILIRUBINUR NEGATIVE 10/29/2016 2040   KETONESUR 15 (A) 10/29/2016 2040   PROTEINUR NEGATIVE 10/29/2016 2040   NITRITE NEGATIVE 10/29/2016 2040   LEUKOCYTESUR NEGATIVE 10/29/2016 2040     Author:  Berle Mull, MD [facility] Pager: [phone] 11/02/2016 4:35 PM     After 7pm go to [email or web address] - password [email or web address]  Call night coverage person covering after 7pm

## 2016-11-02 NOTE — Progress Notes (Signed)
Pharmacy Antibiotic Note  Linda Jefferson is a 71 y.o. female admitted on 10/29/2016 with crampy abdominal pain bloody diarrhea.  Patient was started on Cipro and Flagyl for colitis.  Now to transition to Unasyn.  Her renal function has been stable.  Plan: - Unasyn 1.5gm IV Q6H - Pharmacy will sign off as dosage adjustment is likely unnecessary.  Thank you for the consult!  Height: 5\' 4"  (162.6 cm) Weight: 136 lb (61.7 kg) IBW/kg (Calculated) : 54.7  Temp (24hrs), Avg:98.4 F (36.9 C), Min:98.3 F (36.8 C), Max:98.7 F (37.1 C)   Recent Labs Lab 10/29/16 2355 10/30/16 0320 10/30/16 0745 10/31/16 0422 11/01/16 0541 11/02/16 0752  WBC 11.1*  --   --  7.5 5.2 3.8*  CREATININE 0.79  --  0.78 0.88 0.72 0.68  LATICACIDVEN  --  2.04* 1.5  --   --   --     Estimated Creatinine Clearance: 55.7 mL/min (by C-G formula based on SCr of 0.68 mg/dL).    Allergies  Allergen Reactions  . Mushroom Extract Complex Shortness Of Breath  . Shellfish Allergy Anaphylaxis  . Oxycodone Itching and Other (See Comments)    "skin crawling"  . Eggs Or Egg-Derived Products Other (See Comments)    Only eats egg whites  . Pseudoeph-Doxylamine-Dm-Apap Other (See Comments)    High blood pressure  . Tape Rash and Other (See Comments)    Patient states use paper tape only. All other tapes cause rash.    Cipro 1/24 >> 1/27 Flagyl 1/24 >> 1/27 Unasyn 1/27 >>  1/24 GI panel PCR - negative 1/24 stool cx - Ecoli, Shiga toxin negative.  Campylobacter pending 1/25 C.diff PCR - negative   Payden Docter D. Laney Potashang, PharmD, BCPS Pager:  [phone] 11/02/2016, 11:12 AM

## 2016-11-03 DIAGNOSIS — K529 Noninfective gastroenteritis and colitis, unspecified: Secondary | ICD-10-CM | POA: Diagnosis not present

## 2016-11-03 LAB — BASIC METABOLIC PANEL
Anion gap: 7 (ref 5–15)
BUN: 6 mg/dL (ref 6–20)
CO2: 25 mmol/L (ref 22–32)
Calcium: 9 mg/dL (ref 8.9–10.3)
Chloride: 107 mmol/L (ref 101–111)
Creatinine, Ser: 0.75 mg/dL (ref 0.44–1.00)
GFR calc Af Amer: >60 mL/min (ref >60)
GFR calc non Af Amer: >60 mL/min (ref >60)
Glucose, Bld: 98 mg/dL (ref 65–99)
Potassium: 3.6 mmol/L (ref 3.5–5.1)
Sodium: 139 mmol/L (ref 135–145)

## 2016-11-03 LAB — CBC
HCT: 31.7 % — ABNORMAL LOW (ref 36.0–46.0)
Hemoglobin: 10.5 g/dL — ABNORMAL LOW (ref 12.0–15.0)
MCH: 29.9 pg (ref 26.0–34.0)
MCHC: 33.1 g/dL (ref 30.0–36.0)
MCV: 90.3 fL (ref 78.0–100.0)
Platelets: 263 10*3/uL (ref 150–400)
RBC: 3.51 MIL/uL — ABNORMAL LOW (ref 3.87–5.11)
RDW: 14 % (ref 11.5–15.5)
WBC: 4.1 10*3/uL (ref 4.0–10.5)

## 2016-11-03 LAB — PROCALCITONIN: Procalcitonin: <0.1 ng/mL

## 2016-11-03 LAB — C-REACTIVE PROTEIN: CRP: 1.2 mg/dL — ABNORMAL HIGH (ref <1.0)

## 2016-11-03 LAB — SEDIMENTATION RATE: Sed Rate: 14 mm/hr (ref 0–22)

## 2016-11-03 MED ORDER — SACCHAROMYCES BOULARDII 250 MG PO CAPS: 250.0000 mg | ORAL_CAPSULE | Freq: Two times a day (BID) | ORAL | 0 refills | Status: DC

## 2016-11-03 MED ORDER — AMOXICILLIN-POT CLAVULANATE 500-125 MG PO TABS: 1.0000 | ORAL_TABLET | Freq: Three times a day (TID) | ORAL | 0 refills | Status: AC

## 2016-11-03 MED ORDER — SIMETHICONE 80 MG PO CHEW: 80.0000 mg | CHEWABLE_TABLET | Freq: Four times a day (QID) | ORAL | 0 refills | Status: DC | PRN

## 2016-11-03 NOTE — Discharge Instructions (Signed)
Colitis Introduction Colitis is inflammation of the colon. Colitis may last a short time (acute) or it may last a long time (chronic). What are the causes? This condition may be caused by:  Viruses.  Bacteria.  Reactions to medicine.  Certain autoimmune diseases, such as Crohn disease or ulcerative colitis. Follow these instructions at home: Eating and drinking  Follow instructions from your health care provider about eating or drinking restrictions.  Drink enough fluid to keep your urine clear or pale yellow.  Work with a dietitian to determine which foods cause your condition to flare up.  Avoid foods that cause flare-ups.  Eat a well-balanced diet. Medicines  Take over-the-counter and prescription medicines only as told by your health care provider.  If you were prescribed an antibiotic medicine, take it as told by your health care provider. Do not stop taking the antibiotic even if you start to feel better. General instructions  Keep all follow-up visits as told by your health care provider. This is important. Contact a health care provider if:  Your symptoms do not go away.  You develop new symptoms. Get help right away if:  You have a fever that does not go away with treatment.  You develop chills.  You have extreme weakness, fainting, or dehydration.  You have repeated vomiting.  You develop severe pain in your abdomen.  You pass bloody or tarry stool. This information is not intended to replace advice given to you by your health care provider. Make sure you discuss any questions you have with your health care provider. Document Released: 10/31/2004 Document Revised: 02/29/2016 Document Reviewed: 01/16/2015  2017 [employer]

## 2016-11-03 NOTE — Discharge Summary (Signed)
[facility] Discharge Summary   Patient: Linda Jefferson PJK:[id number]   PCP: [name], MD DOB: 12/16/1945   Date of admission: 10/29/2016   Date of discharge: 11/03/2016     Discharge Diagnoses:  Principal Problem:   Colitis Active Problems:   Hyperlipemia   HTN (hypertension)   Anxiety   Dehydration with hyponatremia   Rectal bleeding   Lactic acidemia   Admitted From: home Disposition:  home  Recommendations for Outpatient Follow-up:  1. Please follow up with PCP in 1 week   [place], MD. Schedule an appointment as soon as possible for a visit in 2 week(s).   Specialty:  Internal Medicine Why:  may need referral to GI. Contact information: [address] [place] [address] [phone]          Diet recommendation: soft diet  Activity: The patient is advised to gradually reintroduce usual activities.  Discharge Condition: good  Code Status: full code  History of present illness: As per the H and P dictated on admission, "Linda Jefferson is a 71 y.o. female with medical history significant for hypertension, this up of dyslipidemia, history of left shoulder repair with recurrent issues of pain, anxiety disorder and previous colitis post colonoscopy. Patient reports that she awakened yesterday morning not feeling well. She began to feel "clammy and developed crampy lower abdominal pain over the suprapubic region. Subsequent she had multiple watery/bloody diarrheal stools. She initially felt it was food borne related stating she had eaten a salad with tuna the previous night that also reported she has never had problems with this particular meal in the past. She also reported increased belching sensation. She is not had any fevers. No recent antibiotics. No sick contacts with similar issues. No family history of ulcer colitis or Crohn's disease. She's had 2 bloody stool since arriving to this facility after being  transferred from [facility] Hospital Course:   Summary of her active problems in the hospital is as following. Colitis - Patient presented with sudden onset of abdominal pain and bloody diarrhea with CT evidence of colitis involving the transverse and ascending colon - Change Cipro and Flagyl to IV Unasyn. Change to oral Augmentin. - Advance diet to soft diet and monitor. Normal ESR and CRP. - C. difficile negative, Escherichia coli negative, Salmonella Shigella negative. - Campylobacter pending  - fecal lactoferrin positive.  Dehydration with hyponatremia, lactic acidosis - resolved, diarrhea improving  GERD - continue simethicone for gas   HTN (hypertension) - Blood pressure elevated - Continue home medications-patient reports typically takes at bedtime  Hyperlipemia - continue statin.  Anxiety -Continue preadmission clonazepam  All other chronic medical condition were stable during the hospitalization.  Patient was ambulatory without any assistance. On the day of the discharge the patient's vitals were stable , and no other acute medical condition were reported by patient. the patient was felt safe to be discharge at home with family.  Procedures and Results:  none   Consultations:  none  DISCHARGE MEDICATION: Discharge Medication List as of 11/03/2016 10:34 AM    START taking these medications   Details  amoxicillin-clavulanate (AUGMENTIN) 500-125 MG tablet Take 1 tablet (500 mg total) by mouth 3 (three) times daily., Starting Sun 11/03/2016, Until Wed 11/06/2016, Normal    saccharomyces boulardii (FLORASTOR) 250 MG capsule Take 1 capsule (250 mg total) by mouth 2 (two) times daily., Starting Sun 11/03/2016, Normal    simethicone (MYLICON) 80  MG chewable tablet Chew 1 tablet (80 mg total) by mouth every 6 (six) hours as needed for flatulence., Starting Sun 11/03/2016, Normal      CONTINUE these medications which have NOT CHANGED   Details    amLODipine-benazepril (LOTREL) 5-10 MG capsule Take 1 capsule by mouth daily., Starting Thu 06/27/2016, Normal    Artificial Tear Ointment (DRY EYES OP) Place 2 drops into both eyes as needed (for dry eyes)., Historical Med    aspirin EC 81 MG tablet Take 81 mg by mouth daily., Historical Med    cetirizine (ZYRTEC) 10 MG tablet Take 10 mg by mouth daily., Historical Med    clonazePAM (KLONOPIN) 0.5 MG tablet Take 1 tablet (0.5 mg total) by mouth 3 (three) times daily as needed for anxiety., Starting Fri 07/05/2016, Print    diclofenac (VOLTAREN) 75 MG EC tablet Take 75 mg by mouth 2 (two) times daily as needed for moderate pain. , Historical Med    finasteride (PROSCAR) 5 MG tablet Take 1 tablet (5 mg total) by mouth daily., Starting Fri 07/05/2016, Normal    fluocinonide (LIDEX) 0.05 % external solution Apply 1 application topically every evening., Starting Sat 09/28/2016, Historical Med    Multiple Vitamin (MULTIVITAMIN) capsule Take 1 capsule by mouth daily.  , Historical Med    Probiotic Product (PROBIOTIC DAILY PO) Take 1 tablet by mouth daily., Historical Med    raloxifene (EVISTA) 60 MG tablet Take 1 tablet (60 mg total) by mouth daily., Starting Fri 07/05/2016, Normal    simvastatin (ZOCOR) 20 MG tablet Take 1 tablet (20 mg total) by mouth at bedtime., Starting Thu 06/27/2016, Normal    albuterol (PROVENTIL HFA;VENTOLIN HFA) 108 (90 BASE) MCG/ACT inhaler Inhale 2 puffs into the lungs every 6 (six) hours as needed for wheezing or shortness of breath., Starting Tue 02/14/2015, Normal    fluticasone (FLONASE) 50 MCG/ACT nasal spray Place 2 sprays into both nostrils daily., Starting Mon 01/23/2015, Normal       Allergies  Allergen Reactions  . Mushroom Extract Complex Shortness Of Breath  . Shellfish Allergy Anaphylaxis  . Oxycodone Itching and Other (See Comments)    "skin crawling"  . Eggs Or Egg-Derived Products Other (See Comments)    Only eats egg whites  .  Pseudoeph-Doxylamine-Dm-Apap Other (See Comments)    High blood pressure  . Tape Rash and Other (See Comments)    Patient states use paper tape only. All other tapes cause rash.   Discharge Instructions    DIET SOFT    Complete by:  As directed    Diet - low sodium heart healthy    Complete by:  As directed    Discharge instructions    Complete by:  As directed    It is important that you read following instructions as well as go over your medication list with RN to help you understand your care after this hospitalization.  Discharge Instructions: Please follow-up with PCP in one week  Please request your primary care physician to go over all Hospital Tests and Procedure/Radiological results at the follow up,  Please get all Hospital records sent to your PCP by signing hospital release before you go home.   Do not take more than prescribed Pain, Sleep and Anxiety Medications. You were cared for by a hospitalist during your hospital stay. If you have any questions about your discharge medications or the care you received while you were in the hospital after you are discharged, you can call the unit  and ask to speak with the hospitalist on call if the hospitalist that took care of you is not available.  Once you are discharged, your primary care physician will handle any further medical issues. Please note that NO REFILLS for any discharge medications will be authorized once you are discharged, as it is imperative that you return to your primary care physician (or establish a relationship with a primary care physician if you do not have one) for your aftercare needs so that they can reassess your need for medications and monitor your lab values. You Must read complete instructions/literature along with all the possible adverse reactions/side effects for all the Medicines you take and that have been prescribed to you. Take any new Medicines after you have completely understood and accept all the  possible adverse reactions/side effects. Wear Seat belts while driving. If you have smoked or chewed Tobacco in the last 2 yrs please stop smoking and/or stop any Recreational drug use.   Increase activity slowly    Complete by:  As directed      Discharge Exam: Filed Weights   10/29/16 2033  Weight: 61.7 kg (136 lb)   Vitals:   11/02/16 2227 11/03/16 0625  BP: (!) 142/69 131/64  Pulse: 73 68  Resp: 18 16  Temp: 98.1 F (36.7 C) 98 F (36.7 C)   General: Appear in no distress, no Rash; Oral Mucosa moist. Cardiovascular: S1 and S2 Present, no Murmur, no JVD Respiratory: Bilateral Air entry present and Clear to Auscultation, no Crackles, no wheezes Abdomen: Bowel Sound present, Soft and no tenderness Extremities: no Pedal edema, no calf tenderness Neurology: Grossly no focal neuro deficit.  The results of significant diagnostics from this hospitalization (including imaging, microbiology, ancillary and laboratory) are listed below for reference.    Significant Diagnostic Studies: Ct Abdomen Pelvis W Contrast  Result Date: 10/30/2016 CLINICAL DATA:  71 year old female with lower abdominal pain and diarrhea. EXAM: CT ABDOMEN AND PELVIS WITH CONTRAST TECHNIQUE: Multidetector CT imaging of the abdomen and pelvis was performed using the standard protocol following bolus administration of intravenous contrast. CONTRAST:  149m ISOVUE-300 IOPAMIDOL (ISOVUE-300) INJECTION 61% COMPARISON:  Abdominal CT dated 04/30/2008 FINDINGS: Lower chest: Minimal bibasilar linear atelectasis/ scarring. The visualized lung bases are otherwise clear. No intra-abdominal free air.  No free fluid. Hepatobiliary: Cholecystectomy. Mild biliary ductal dilatation likely post cholecystectomy. There is normal tapering of the CBD at the head of the pancreas. There is mild irregularity of the hepatic contour which may represent early changes of cirrhosis. Clinical correlation is recommended. There is a 2.8 cm left hepatic  cyst larger since the prior CT of 2009. Subcentimeter right hepatic hypodense lesion is too small to characterize but appears similar to prior CT and likely represents a cyst. Pancreas: Unremarkable. No pancreatic ductal dilatation or surrounding inflammatory changes. Spleen: Normal in size without focal abnormality. Adrenals/Urinary Tract: The adrenal glands appear unremarkable. There is extrarenal pelvis with mild pelviectasis bilaterally. No hydronephrosis. The visualized ureters and urinary bladder appear unremarkable. Subcentimeter right renal hypodense lesion is too small to characterize but likely represents a cyst. Stomach/Bowel: Oral contrast is noted within the stomach and multiple loops of small bowel without evidence of obstruction. There is inflammatory changes and circumferential thickening of the distal half of the transverse colon and entire descending colon compatible with colitis. Loose stool noted within the rectosigmoid compatible with diarrheal state. Correlation with clinical exam and stool cultures recommended. A small sigmoid diverticula noted without active inflammation. The appendix is  normal. Vascular/Lymphatic: There is mild aortoiliac atherosclerotic disease. The origins of the celiac axis, SMA, IMA as well as the origins of the renal arteries are patent. The SMV, splenic vein, and the main portal vein are patent. No portal venous gas identified. There is no adenopathy. Reproductive: There is a small calcified anterior uterine body fibroid. The left ovary is unremarkable. The right ovary is not well visualized. No pelvic mass noted. Other: None Musculoskeletal: There is degenerative changes of the spine. No acute fracture. IMPRESSION: Colitis primarily involving the transverse and descending colon likely infectious or inflammatory in etiology. Ischemic colitis is less likely. Correlation with clinical exam and stool cultures recommended. No bowel obstruction. Normal appendix. Slight  irregularity of the liver contour may represent early changes of cirrhosis. Clinical correlation is recommended. Electronically Signed   By: Anner Crete M.D.   On: 10/30/2016 02:01    Microbiology: Recent Results (from the past 240 hour(s))  Gastrointestinal Panel by PCR , Stool     Status: None   Collection Time: 10/30/16 11:15 AM  Result Value Ref Range Status   Campylobacter species NOT DETECTED NOT DETECTED Final   Plesimonas shigelloides NOT DETECTED NOT DETECTED Final   Salmonella species NOT DETECTED NOT DETECTED Final   Yersinia enterocolitica NOT DETECTED NOT DETECTED Final   Vibrio species NOT DETECTED NOT DETECTED Final   Vibrio cholerae NOT DETECTED NOT DETECTED Final   Enteroaggregative E coli (EAEC) NOT DETECTED NOT DETECTED Final   Enteropathogenic E coli (EPEC) NOT DETECTED NOT DETECTED Final   Enterotoxigenic E coli (ETEC) NOT DETECTED NOT DETECTED Final   Shiga like toxin producing E coli (STEC) NOT DETECTED NOT DETECTED Final   Shigella/Enteroinvasive E coli (EIEC) NOT DETECTED NOT DETECTED Final   Cryptosporidium NOT DETECTED NOT DETECTED Final   Cyclospora cayetanensis NOT DETECTED NOT DETECTED Final   Entamoeba histolytica NOT DETECTED NOT DETECTED Final   Giardia lamblia NOT DETECTED NOT DETECTED Final   Adenovirus F40/41 NOT DETECTED NOT DETECTED Final   Astrovirus NOT DETECTED NOT DETECTED Final   Norovirus GI/GII NOT DETECTED NOT DETECTED Final   Rotavirus A NOT DETECTED NOT DETECTED Final   Sapovirus (I, II, IV, and V) NOT DETECTED NOT DETECTED Final  Stool culture (children & immunocomp patients)     Status: None (Preliminary result)   Collection Time: 10/30/16  4:01 PM  Result Value Ref Range Status   Salmonella/Shigella Screen Final report  Final    Comment: (NOTE) Performed At: [facility] [place], [place] [id number] Lindon Romp MD [id number]    Campylobacter Culture PENDING  Incomplete   E coli, Shiga toxin  Assay Negative Negative Final    Comment: (NOTE) Performed At: [facility] [place], [place] [id number] Lindon Romp MD [id number]   STOOL CULTURE REFLEX - RSASHR     Status: None   Collection Time: 10/30/16  4:01 PM  Result Value Ref Range Status   Stool Culture result 1 (RSASHR) Comment  Final    Comment: (NOTE) No Salmonella or Shigella recovered. Performed At: [facility] [place], [place] [id number] Lindon Romp MD [id number]   C difficile quick scan w PCR reflex     Status: None   Collection Time: 10/31/16  7:46 AM  Result Value Ref Range Status   C Diff antigen NEGATIVE NEGATIVE Final   C Diff toxin NEGATIVE NEGATIVE Final   C Diff interpretation No C. difficile detected.  Final  Labs: CBC:  Recent Labs Lab 10/29/16 2355 10/31/16 0422 11/01/16 0541 11/02/16 0752 11/03/16 0451  WBC 11.1* 7.5 5.2 3.8* 4.1  NEUTROABS 10.0*  --   --   --   --   HGB 13.2 11.3* 9.9* 10.4* 10.5*  HCT 38.0 33.8* 30.4* 31.9* 31.7*  MCV 88.2 90.4 91.3 90.9 90.3  PLT 351 276 224 233 497   Basic Metabolic Panel:  Recent Labs Lab 10/30/16 0745 10/31/16 0422 11/01/16 0541 11/02/16 0752 11/03/16 0451  NA 138 139 138 138 139  K 3.4* 4.1 3.6 3.7 3.6  CL 103 105 108 105 107  CO2 '26 26 25 23 25  ' GLUCOSE 110* 110* 93 90 98  BUN '11 10 6 ' <5* 6  CREATININE 0.78 0.88 0.72 0.68 0.75  CALCIUM 9.3 8.6* 8.4* 9.0 9.0   Liver Function Tests:  Recent Labs Lab 10/29/16 2355 10/30/16 0745 10/31/16 0422  AST 37 37 25  ALT '23 23 18  ' ALKPHOS 83 78 60  BILITOT 0.7 0.8 0.6  PROT 7.0 6.4* 5.1*  ALBUMIN 4.0 3.6 2.8*   Time spent: 30 minutes  Signed:  Harris Penton  [facility] 11/03/2016 , 12:50 PM

## 2016-11-03 NOTE — Progress Notes (Signed)
Patient discharged to home with belongings, IVs and tele removed. AVS given, all teach back performed. All questions answered, patient stable at time of discharge.

## 2016-11-04 ENCOUNTER — Telehealth: Payer: Self-pay | Admitting: Behavioral Health

## 2016-11-04 LAB — STOOL CULTURE: E coli, Shiga toxin Assay: NEGATIVE

## 2016-11-04 LAB — STOOL CULTURE REFLEX - CMPCXR

## 2016-11-04 LAB — STOOL CULTURE REFLEX - RSASHR

## 2016-11-04 NOTE — Telephone Encounter (Signed)
Transition Care Management Follow-up Telephone Call  PCP: Willow OraJose Paz, MD DOB: 1946-03-11   Date of admission: 10/29/2016             Date of discharge: 11/03/2016     Discharge Diagnoses:  Principal Problem:   Colitis Active Problems:   Hyperlipemia   HTN (hypertension)   Anxiety   Dehydration with hyponatremia   Rectal bleeding   Lactic acidemia   Admitted From: home Disposition:  home  Recommendations for Outpatient Follow-up:  1. Please follow up with PCP in 1 week     How have you been since you were released from the hospital? Patient stated, "She feels greatly fatigued".   Do you understand why you were in the hospital? yes   Do you understand the discharge instructions? yes   Where were you discharged to? Home with spouse.   Items Reviewed:  Medications reviewed: yes  Allergies reviewed: yes  Dietary changes reviewed: yes, bland diet  Referrals reviewed: yes, Please follow up with PCP in 1 week.   Functional Questionnaire:   Activities of Daily Living (ADLs):   She states they are independent in the following: ambulation, bathing and hygiene, feeding, continence, grooming, toileting and dressing States they require assistance with the following: None   Any transportation issues/concerns?: no   Any patient concerns? no   Confirmed importance and date/time of follow-up visits scheduled yes, 11/06/16 at 11:30 AM.  Provider Appointment booked with Dr. Drue NovelPaz.  Confirmed with patient if condition begins to worsen call PCP or go to the ER.  Patient was given the office number and encouraged to call back with question or concerns.  : yes

## 2016-11-06 ENCOUNTER — Ambulatory Visit (INDEPENDENT_AMBULATORY_CARE_PROVIDER_SITE_OTHER): Payer: Medicare Other | Admitting: Internal Medicine

## 2016-11-06 ENCOUNTER — Encounter: Payer: Self-pay | Admitting: Gastroenterology

## 2016-11-06 ENCOUNTER — Encounter: Payer: Self-pay | Admitting: Internal Medicine

## 2016-11-06 VITALS — BP 128/62 | HR 73 | Temp 97.8°F | Resp 12 | Ht 64.0 in | Wt 137.0 lb

## 2016-11-06 DIAGNOSIS — K529 Noninfective gastroenteritis and colitis, unspecified: Secondary | ICD-10-CM

## 2016-11-06 NOTE — Patient Instructions (Signed)
Keep your appointment for a physical

## 2016-11-06 NOTE — Progress Notes (Signed)
Pre visit review using our clinic review tool, if applicable. No additional management support is needed unless otherwise documented below in the visit note. 

## 2016-11-06 NOTE — Progress Notes (Signed)
Subjective:    Patient ID: Linda Jefferson, female    DOB: [birth year], 71 y.o.   MRN: [id number]  DOS:  11/06/2016 Type of visit - description : TCM 7 Interval history: Admitted to the hospital 10/29/2016, discharged 5 days later. He was admitted with  abdominal pain, feeling clammy, multiple episodes of watery/bloody diarrhea. CT show evidence of colitis and the transverse and ascending colon, received Cipro and Flagyl, then switch to Augmentin. Sedimentation rate and CRP normal, C. difficile negative, other stool cultures negative. GI PCR panel negative. Fecal lactoferrin positive. Labs reviewed: Last BMP satisfactory, last white count normal, last hemoglobin 10.5 slightly lower than baseline. CT report: Colitis primarily involving the transverse and descending colon likely infectious or inflammatory in etiology. Ischemic colitis is less likely. Correlation with clinical exam and stool cultures recommended. No bowel obstruction. Normal appendix.  Slight irregularity of the liver contour may represent early changes of cirrhosis. Clinical correlation is recommended.  Review of Systems Since she left the hospital, is taking the antibiotics appropriately and is feeling better. Denies any fever chills No abdominal cramps No nausea or vomiting Appetite is increasing, she is following a bland diet without much problems. First time she had a bowel movement after the hospital stay was today, loose stools without blood.   Past Medical History:  Diagnosis Date  . Anxiety   . Carotid artery disease (HCC)    per u/s 01/2009- repeated u/s 7/11 "mild dz, no stenosis"  . Dyslipidemia   . Food allergy    mushrooms  . Hyperlipidemia   . Hypertension   . LBP (low back pain) 3/11   after a local injection, f/u [facility]  . Normal cardiac stress test 04/2009  . Osteopenia   . Seasonal allergies   . 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 Wears hearing aid     Past  Surgical History:  Procedure Laterality Date  . CARPAL TUNNEL RELEASE    . CHOLECYSTECTOMY    . FOOT SURGERY  june 2014   B bunion   . TRIGGER FINGER RELEASE  03/31/2012   Procedure: RELEASE TRIGGER FINGER/A-1 PULLEY;  Surgeon: Wyn Forster., MD;  Location: [facility];  Service: Orthopedics;  Laterality: Right;  right index    Social History   Social History  . Marital status: Married    Spouse name: N/A  . Number of children: 3  . Years of education: N/A   Occupational History  . TEACHER-- retired    .  [employer]   Social History Main Topics  . Smoking status: Never Smoker  . Smokeless tobacco: Never Used  . Alcohol use 1.2 oz/week    2 Glasses of wine per week     Comment: socially   . Drug use: No  . Sexual activity: Not on file   Other Topics Concern  . Not on file   Social History Narrative   ** Merged History Encounter **       3 children, one in [place] (PhD)           Allergies as of 11/06/2016      Reactions   Mushroom Extract Complex Shortness Of Breath   Shellfish Allergy Anaphylaxis   Oxycodone Itching, Other (See Comments)   "skin crawling"   Eggs Or Egg-derived Products Other (See Comments)   Only eats egg whites   Pseudoeph-doxylamine-dm-apap Other (See Comments)   High blood pressure   Tape  Rash, Other (See Comments)   Patient states use paper tape only. All other tapes cause rash.      Medication List       Accurate as of 11/06/16  6:55 PM. Always use your most recent med list.          albuterol 108 (90 Base) MCG/ACT inhaler Commonly known as:  PROVENTIL HFA;VENTOLIN HFA Inhale 2 puffs into the lungs every 6 (six) hours as needed for wheezing or shortness of breath.   amLODipine-benazepril 5-10 MG capsule Commonly known as:  LOTREL Take 1 capsule by mouth daily.   amoxicillin-clavulanate 500-125 MG tablet Commonly known as:  AUGMENTIN Take 1 tablet (500 mg total) by mouth 3 (three)  times daily.   aspirin EC 81 MG tablet Take 81 mg by mouth daily.   cetirizine 10 MG tablet Commonly known as:  ZYRTEC Take 10 mg by mouth daily.   clonazePAM 0.5 MG tablet Commonly known as:  KLONOPIN Take 1 tablet (0.5 mg total) by mouth 3 (three) times daily as needed for anxiety.   diclofenac 75 MG EC tablet Commonly known as:  VOLTAREN Take 75 mg by mouth 2 (two) times daily as needed for moderate pain.   DRY EYES OP Place 2 drops into both eyes as needed (for dry eyes).   finasteride 5 MG tablet Commonly known as:  PROSCAR Take 1 tablet (5 mg total) by mouth daily.   fluocinonide 0.05 % external solution Commonly known as:  LIDEX Apply 1 application topically every evening.   fluticasone 50 MCG/ACT nasal spray Commonly known as:  FLONASE Place 2 sprays into both nostrils daily.   multivitamin capsule Take 1 capsule by mouth daily.   PROBIOTIC DAILY PO Take 1 tablet by mouth daily.   raloxifene 60 MG tablet Commonly known as:  EVISTA Take 1 tablet (60 mg total) by mouth daily.   saccharomyces boulardii 250 MG capsule Commonly known as:  FLORASTOR Take 1 capsule (250 mg total) by mouth 2 (two) times daily.   simethicone 80 MG chewable tablet Commonly known as:  MYLICON Chew 1 tablet (80 mg total) by mouth every 6 (six) hours as needed for flatulence.   simvastatin 20 MG tablet Commonly known as:  ZOCOR Take 1 tablet (20 mg total) by mouth at bedtime.          Objective:   Physical Exam BP 128/62 (BP Location: Right Arm, Patient Position: Sitting, Cuff Size: Small)   Pulse 73   Temp 97.8 F (36.6 C) (Oral)   Resp 12   Ht 5\' 4"  (1.626 m)   Wt 137 lb (62.1 kg)   SpO2 98%   BMI 23.52 kg/m  General:   Well developed, well nourished . NAD.  HEENT:  Normocephalic . Face symmetric, atraumatic Lungs:  CTA B Normal respiratory effort, no intercostal retractions, no accessory muscle use. Heart: RRR,  no murmur.  no pretibial edema bilaterally    Abdomen:  Not distended, soft, non-tender. No rebound or rigidity.  Skin: Not pale. Not jaundice Neurologic:  alert & oriented X3.  Speech normal, gait appropriate for age and unassisted Psych--  Cognition and judgment appear intact.  Cooperative with normal attention span and concentration.  Behavior appropriate. No anxious or depressed appearing.    Assessment & Plan:   Assessment> HTN Hyperlipidemia Osteopenia  Per dexa 2011,  2014: t score  - 1.7. On Evista d/t FB breast ca Anxiety-- on clonazepam prn Seasonal (and food?) allergies Alopecia on proscar long  term  HOH, hearing aids H/o Syncope, 2010:   --Nl  stress test, Holter: Atrial tachycardia --Carotid ultrasound 2010,2011 , 2014 no significant stenosis +FH CAD, mother age 72 + FH Breast ca- mother, sister x2, aunt --- on Evista  H/o blood in stools, Cscope 2009, (-) except for hemorrhoids  admitted colitis 10-2016  PLAN: Colitis: Improving, recommend to continue doing a bland diet and transition to a regular diet gradually. Finish Augmentin as she is planning. Continue probiotics. Avoid NSAIDs if possible. We'll refer to GI at [facility]. In addition to recent colitis, she reports episode of on and off diarrhea for a while. Somewhat reluctant to check labs today, will do blood work when she comes back in 3 weeks for a CPX.

## 2016-11-06 NOTE — Assessment & Plan Note (Signed)
Colitis: Improving, recommend to continue doing a bland diet and transition to a regular diet gradually. Finish Augmentin as she is planning. Continue probiotics. Avoid NSAIDs if possible. We'll refer to GI at [facility]. In addition to recent colitis, she reports episode of on and off diarrhea for a while. Somewhat reluctant to check labs today, will do blood work when she comes back in 3 weeks for a CPX.

## 2016-11-18 ENCOUNTER — Encounter: Payer: Medicare Other | Admitting: Internal Medicine

## 2016-11-20 ENCOUNTER — Telehealth: Payer: Self-pay | Admitting: *Deleted

## 2016-11-20 NOTE — Telephone Encounter (Signed)
AWV sched for 11/25/16 @2 .

## 2016-11-22 NOTE — Progress Notes (Signed)
Subjective:   Linda Jefferson is a 71 y.o. female who presents for Medicare Annual (Subsequent) preventive examination.  Review of Systems:  No ROS.  Medicare Wellness Visit.  Cardiac Risk Factors include: advanced age (>31men, >74 women);dyslipidemia;hypertension Sleep patterns: feels rested on waking, does not get up to void and sleeps 7 hours nightly.   Home Safety/Smoke Alarms:  Feels safe in home. Smoke alarms in place.  And carbon monoxide alarms. Living environment; residence and Firearm Safety: split level / walkout, number of inside stairs: 20, no firearms. Seat Belt Safety/Bike Helmet: Wears seat belt.   Counseling:   Eye Exam- Eye dr yearly.   Dental- Yearly cleanings.   Female:   Pap- Last in 2011. Aged out.      Mammo- Last 11/22/15: BI-RADS CATEGORY 1:Negative.   Scheduled March 7th at [facility].    Dexa scan-Last 07/30/13: Osteopenia.  Ordered.  CCS- Last 04/18/08: normal per external report.     Objective:     Vitals: BP 134/70 (BP Location: Right Arm, Patient Position: Sitting, Cuff Size: Normal)   Pulse 66   Temp 97.7 F (36.5 C)   Resp 16   Ht 5\' 4"  (1.626 m)   Wt 133 lb 3.2 oz (60.4 kg)   SpO2 98%   BMI 22.86 kg/m   Body mass index is 22.86 kg/m.   Tobacco History  Smoking Status  . Never Smoker  Smokeless Tobacco  . Never Used     Counseling given: Not Answered   Past Medical History:  Diagnosis Date  . Anxiety   . Carotid artery disease (HCC)    per u/s 01/2009- repeated u/s 7/11 "mild dz, no stenosis"  . Colitis   . Dyslipidemia   . Food allergy    mushrooms  . Hyperlipidemia   . Hypertension   . LBP (low back pain) 3/11   after a local injection, f/u [facility]  . Normal cardiac stress test 04/2009  . Osteopenia   . Seasonal allergies   . 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 Wears hearing aid    Past Surgical History:  Procedure Laterality Date  . CARPAL TUNNEL RELEASE    . CHOLECYSTECTOMY    .  FOOT SURGERY  june 2014   B bunion   . TOTAL SHOULDER REPLACEMENT Left   . TRIGGER FINGER RELEASE  03/31/2012   Procedure: RELEASE TRIGGER FINGER/A-1 PULLEY;  Surgeon: Wyn Forster., MD;  Location: [facility];  Service: Orthopedics;  Laterality: Right;  right index   Family History  Problem Relation Age of Onset  . Coronary artery disease Mother 63  . Stroke Mother   . Diabetes Mother   . Breast cancer Mother     M, 2 sisters, 1 aunt  . Hypertension Other     M family  . Colon cancer Neg Hx    History  Sexual Activity  . Sexual activity: No    Outpatient Encounter Prescriptions as of 11/25/2016  Medication Sig  . amLODipine-benazepril (LOTREL) 5-10 MG capsule Take 1 capsule by mouth daily.  . Artificial Tear Ointment (DRY EYES OP) Place 2 drops into both eyes as needed (for dry eyes).  Marland Kitchen aspirin EC 81 MG tablet Take 81 mg by mouth daily.  . cetirizine (ZYRTEC) 10 MG tablet Take 10 mg by mouth daily.  . clonazePAM (KLONOPIN) 0.5 MG tablet Take 1 tablet (0.5 mg total) by mouth 3 (three) times daily as needed for  anxiety.  . diclofenac (VOLTAREN) 75 MG EC tablet Take 75 mg by mouth 2 (two) times daily as needed for moderate pain.   . finasteride (PROSCAR) 5 MG tablet Take 1 tablet (5 mg total) by mouth daily. (Patient taking differently: Take 2.5 mg by mouth daily. )  . fluocinonide (LIDEX) 0.05 % external solution Apply 1 application topically every evening.  . fluticasone (FLONASE) 50 MCG/ACT nasal spray Place 2 sprays into both nostrils daily. (Patient taking differently: Place 2 sprays into both nostrils daily as needed for allergies. )  . Multiple Vitamin (MULTIVITAMIN) capsule Take 1 capsule by mouth daily.    . Probiotic Product (PROBIOTIC DAILY PO) Take 1 tablet by mouth daily.  . raloxifene (EVISTA) 60 MG tablet Take 1 tablet (60 mg total) by mouth daily.  Marland Kitchen saccharomyces boulardii (FLORASTOR) 250 MG capsule Take 1 capsule (250 mg total) by mouth 2 (two)  times daily.  . simethicone (MYLICON) 80 MG chewable tablet Chew 1 tablet (80 mg total) by mouth every 6 (six) hours as needed for flatulence.  . simvastatin (ZOCOR) 20 MG tablet Take 1 tablet (20 mg total) by mouth at bedtime.  Marland Kitchen albuterol (PROVENTIL HFA;VENTOLIN HFA) 108 (90 BASE) MCG/ACT inhaler Inhale 2 puffs into the lungs every 6 (six) hours as needed for wheezing or shortness of breath. (Patient not taking: Reported on 07/05/2016)   No facility-administered encounter medications on file as of 11/25/2016.     Activities of Daily Living In your present state of health, do you have any difficulty performing the following activities: 11/25/2016 10/30/2016  Hearing? N Y  Vision? N N  Difficulty concentrating or making decisions? N N  Walking or climbing stairs? N N  Dressing or bathing? N N  Doing errands, shopping? N N  Preparing Food and eating ? N -  Using the Toilet? N -  In the past six months, have you accidently leaked urine? N -  Do you have problems with loss of bowel control? N -  Managing your Medications? N -  Managing your Finances? N -  Housekeeping or managing your Housekeeping? N -  Some recent data might be hidden    Patient Care Team: Wanda Plump, MD as PCP - General (Internal Medicine) Wanda Plump, MD (Internal Medicine) Larence Penning, MD as Referring Physician (Optometry) Jodi Geralds, MD as Consulting Physician (Orthopedic Surgery) Mikki Santee, MD (Inactive) as Consulting Physician (Allergy and Immunology)    Assessment:    Physical assessment deferred to PCP.  Exercise Activities and Dietary recommendations Current Exercise Habits: Home exercise routine (Silver sneakers videos at home), Frequency (Times/Week): 4   Diet (meal preparation, eat out, water intake, caffeinated beverages, dairy products, fruits and vegetables):  Breakfast: Special K, banana, green tea. Lunch: White bread, peanut butter and jelly or bagel with light cheese Dinner: Chicken or [place]  or cod or salmon with rice, potatoes and vegetable. Pears or peaches.     72 ounces water daily. No caffeine.   Goals    . Return to normal exercise routine    . River cruise      Fall Risk Fall Risk  11/25/2016 07/05/2016 08/10/2015 08/03/2014 08/03/2014  Falls in the past year? No No No No No   Depression Screen PHQ 2/9 Scores 11/25/2016 07/05/2016 08/10/2015 08/03/2014  PHQ - 2 Score 0 0 0 0     Cognitive Function MMSE - Mini Mental State Exam 11/25/2016  Orientation to time 5  Orientation to Place 5  Registration 3  Attention/ Calculation 5  Recall 3  Language- name 2 objects 2  Language- repeat 1  Language- follow 3 step command 3  Language- read & follow direction 1  Write a sentence 1  Copy design 1  Total score 30        Immunization History  Administered Date(s) Administered  . Influenza, Quadrivalent, Recombinant, Inj, Pf 08/15/2016  . Td 02/04/2009   Screening Tests Health Maintenance  Topic Date Due  . DEXA SCAN  07/31/2015  . MAMMOGRAM  11/21/2016  . PNA vac Low Risk Adult (1 of 2 - PCV13) 12/23/2016 (Originally 02/18/2010)  . COLONOSCOPY  04/18/2018  . TETANUS/TDAP  02/05/2019  . Hepatitis C Screening  Completed      Plan:   Schedule bone density scan, order placed.   Follow up with PCP as ordered.   Continue to eat heart healthy diet (full of fruits, vegetables, whole grains, lean protein, water--limit salt, fat, and sugar intake) and increase physical activity as tolerated.  During the course of the visit the patient was educated and counseled about the following appropriate screening and preventive services:   Vaccines to include Pneumoccal, Influenza, Hepatitis B, Td, Zostavax, HCV - UTD, declines PNA  Cardiovascular Disease  Colorectal cancer screening -UTD  Bone density screening - Ordered  Diabetes screening  Glaucoma screening - yearly eye dr appt.   Mammography/PAP - UTD  Nutrition counseling - Discussed  Patient Instructions  (the written plan) was given to the patient.   Richelle ItoCassandra Albright, RN  11/25/2016  Willow OraJose Paz, MD

## 2016-11-22 NOTE — Progress Notes (Signed)
Pre visit review using our clinic review tool, if applicable. No additional management support is needed unless otherwise documented below in the visit note. 

## 2016-11-25 ENCOUNTER — Ambulatory Visit (INDEPENDENT_AMBULATORY_CARE_PROVIDER_SITE_OTHER): Payer: Medicare Other | Admitting: Internal Medicine

## 2016-11-25 ENCOUNTER — Encounter: Payer: Self-pay | Admitting: Internal Medicine

## 2016-11-25 VITALS — BP 134/70 | HR 66 | Temp 97.7°F | Resp 16 | Ht 64.0 in | Wt 133.2 lb

## 2016-11-25 DIAGNOSIS — Z09 Encounter for follow-up examination after completed treatment for conditions other than malignant neoplasm: Secondary | ICD-10-CM | POA: Diagnosis not present

## 2016-11-25 DIAGNOSIS — Z780 Asymptomatic menopausal state: Secondary | ICD-10-CM

## 2016-11-25 DIAGNOSIS — D649 Anemia, unspecified: Secondary | ICD-10-CM

## 2016-11-25 DIAGNOSIS — Z0000 Encounter for general adult medical examination without abnormal findings: Secondary | ICD-10-CM

## 2016-11-25 NOTE — Assessment & Plan Note (Addendum)
Td 5-10; had a Flu shot pneumonia -Shingles immunization-- declined all Cervix ca screening: married x 1, no h/o abnormal PAPs, multiple normal ones in her 6450s, 60s (per pt) >> no further PAPs unless pt so desires     Strong FH breast ca, pt reports previous  genetic testing ; has a mammogram scheduled for next week,   breast exam today (-)   Colonoscopy:  04/18/2008, hemorrhoids. Next 2019  Palpable Ao: US neg for AAA 6-10  diet-exercise discussed  Labs: BMP, FLP, CBC, iron, ferritin, TSH.

## 2016-11-25 NOTE — Progress Notes (Addendum)
Subjective:    Patient ID: Linda Jefferson, female    DOB: 1945/12/31, 71 y.o.   MRN: [id number]  DOS:  11/25/2016 Type of visit - description :  cpx Interval history: No major concerns, good compliance of medication.   Review of Systems  Constitutional: No fever. No chills. No unexplained wt changes. No unusual sweats  HEENT: No dental problems, no ear discharge, no facial swelling, no voice changes. No eye discharge, no eye  redness , no  intolerance to light   Respiratory: No wheezing , no  difficulty breathing. No cough , no mucus production  Cardiovascular: No CP, no leg swelling , no  Palpitations  GI: no nausea, no vomiting, no diarrhea , no  abdominal pain.  No blood in the stools. No dysphagia, no odynophagia    Endocrine: No polyphagia, no polyuria , no polydipsia  GU: No dysuria, gross hematuria, difficulty urinating. No urinary urgency, no frequency.  Musculoskeletal: No joint swellings or unusual aches or pains  Skin: No change in the color of the skin, palor , no  Rash  Allergic, immunologic: No environmental allergies , no  food allergies  Neurological: No dizziness no  syncope. No headaches. No diplopia, no slurred, no slurred speech, no motor deficits, no facial  Numbness  Hematological: No enlarged lymph nodes, no easy bruising , no unusual bleedings  Psychiatry: No suicidal ideas, no hallucinations, no beavior problems, no confusion.  No unusual/severe anxiety, no depression   Past Medical History:  Diagnosis Date  . Anxiety   . Carotid artery disease (HCC)    per u/s 01/2009- repeated u/s 7/11 "mild dz, no stenosis"  . Colitis   . Dyslipidemia   . Food allergy    mushrooms  . Hyperlipidemia   . Hypertension   . LBP (low back pain) 3/11   after a local injection, f/u [facility]  . Normal cardiac stress test 04/2009  . Osteopenia   . Seasonal allergies   . 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 Wears hearing aid     Past  Surgical History:  Procedure Laterality Date  . CARPAL TUNNEL RELEASE    . CHOLECYSTECTOMY    . FOOT SURGERY  june 2014   B bunion   . TOTAL SHOULDER REPLACEMENT Left 04/2016  . TRIGGER FINGER RELEASE  03/31/2012   Procedure: RELEASE TRIGGER FINGER/A-1 PULLEY;  Surgeon: Wyn Forster., MD;  Location: [facility];  Service: Orthopedics;  Laterality: Right;  right index    Social History   Social History  . Marital status: Married    Spouse name: N/A  . Number of children: 3  . Years of education: N/A   Occupational History  . TEACHER-- retired    .  [employer]   Social History Main Topics  . Smoking status: Never Smoker  . Smokeless tobacco: Never Used  . Alcohol use No     Comment: socially. Not since Jan 23  . Drug use: No  . Sexual activity: No   Other Topics Concern  . Not on file   Social History Narrative      3 children, one in [place] (PhD)                    Family History  Problem Relation Age of Onset  . Coronary artery disease Mother 7  . Stroke Mother   . Diabetes Mother   . Breast cancer Mother  M, 2 sisters, 1 aunt  . Hypertension Other     M family  . Colon cancer Neg Hx      Allergies as of 11/25/2016      Reactions   Mushroom Extract Complex Shortness Of Breath   Shellfish Allergy Anaphylaxis   Oxycodone Itching, Other (See Comments)   "skin crawling"   Eggs Or Egg-derived Products Other (See Comments)   Only eats egg whites. Egg yolk causes nausea.   Pseudoeph-doxylamine-dm-apap Other (See Comments)   High blood pressure.   Tape Rash, Other (See Comments)   Patient states use paper tape only. All other tapes cause rash.      Medication List       Accurate as of 11/25/16 11:59 PM. Always use your most recent med list.          albuterol 108 (90 Base) MCG/ACT inhaler Commonly known as:  PROVENTIL HFA;VENTOLIN HFA Inhale 2 puffs into the lungs every 6 (six) hours as needed for  wheezing or shortness of breath.   amLODipine-benazepril 5-10 MG capsule Commonly known as:  LOTREL Take 1 capsule by mouth daily.   aspirin EC 81 MG tablet Take 81 mg by mouth daily.   cetirizine 10 MG tablet Commonly known as:  ZYRTEC Take 10 mg by mouth daily.   clonazePAM 0.5 MG tablet Commonly known as:  KLONOPIN Take 1 tablet (0.5 mg total) by mouth 3 (three) times daily as needed for anxiety.   diclofenac 75 MG EC tablet Commonly known as:  VOLTAREN Take 75 mg by mouth 2 (two) times daily as needed for moderate pain.   DRY EYES OP Place 2 drops into both eyes as needed (for dry eyes).   finasteride 5 MG tablet Commonly known as:  PROSCAR Take 1 tablet (5 mg total) by mouth daily.   fluocinonide 0.05 % external solution Commonly known as:  LIDEX Apply 1 application topically every evening.   fluticasone 50 MCG/ACT nasal spray Commonly known as:  FLONASE Place 2 sprays into both nostrils daily.   multivitamin capsule Take 1 capsule by mouth daily.   PROBIOTIC DAILY PO Take 1 tablet by mouth daily.   raloxifene 60 MG tablet Commonly known as:  EVISTA Take 1 tablet (60 mg total) by mouth daily.   saccharomyces boulardii 250 MG capsule Commonly known as:  FLORASTOR Take 1 capsule (250 mg total) by mouth 2 (two) times daily.   simethicone 80 MG chewable tablet Commonly known as:  MYLICON Chew 1 tablet (80 mg total) by mouth every 6 (six) hours as needed for flatulence.   simvastatin 20 MG tablet Commonly known as:  ZOCOR Take 1 tablet (20 mg total) by mouth at bedtime.          Objective:   Physical Exam BP 134/70 (BP Location: Right Arm, Patient Position: Sitting, Cuff Size: Normal)   Pulse 66   Temp 97.7 F (36.5 C)   Resp 16   Ht 5\' 4"  (1.626 m)   Wt 133 lb 3.2 oz (60.4 kg)   SpO2 98%   BMI 22.86 kg/m  General:   Well developed, well nourished . NAD.  Neck: No  thyromegaly  HEENT:  Normocephalic . Face symmetric,  atraumatic Breast: no dominant mass, skin and nipples normal to inspection on palpation, axillary areas without mass or lymphadenopathy Lungs:  CTA B Normal respiratory effort, no intercostal retractions, no accessory muscle use. Heart: RRR,  no murmur.  No pretibial edema bilaterally  Abdomen:  Not  distended, soft, non-tender. No rebound or rigidity.   Skin: Exposed areas without rash. Not pale. Not jaundice Neurologic:  alert & oriented X3.  Speech normal, gait appropriate for age and unassisted Strength symmetric and appropriate for age.  Psych: Cognition and judgment appear intact.  Cooperative with normal attention span and concentration.  Behavior appropriate. No anxious or depressed appearing.     Assessment & Plan:   Assessment> HTN Hyperlipidemia Osteopenia  Per dexa 2011,  2014: t score  - 1.7. On Evista d/t FH breast ca Anxiety-- on clonazepam prn Seasonal (and food?) allergies Alopecia on proscar long term  HOH, hearing aids H/o Syncope, 2010:   --Nl  stress test, Holter: Atrial tachycardia --Carotid ultrasound 2010,2011 , 2014 no significant stenosis +FH CAD, mother age 72 + FH Breast ca- mother, sister x2, aunt --- on Evista  H/o blood in stools, Cscope 2009, (-) except for hemorrhoids  admitted colitis 10-2016  PLAN: HTN: Continue Lotrel, checking labs Hyperlipidemia: Continue simvastatin, checking labs Osteopenia: On calcium, vitamin D, Evista d/t FH breast ca. Check a bone density test Anxiety: On clonazepam, contract and UDS today. S/p Colitis: Improved, on 2 probiotics. GI appointment pending. Recent hemoglobin low, check a CBC, iron and ferritin. RTC 6 months

## 2016-11-25 NOTE — Patient Instructions (Addendum)
Schedule bone density scan, order placed.   Follow up with PCP as ordered.   Continue to eat heart healthy diet (full of fruits, vegetables, whole grains, lean protein, water--limit salt, fat, and sugar intake) and increase physical activity as tolerated.   ========================   GO TO THE LAB : provide a urine sample for a UDS   GO TO THE FRONT DESK Schedule your next appointment for a  checkup in 6 months  Schedule labs to be done this week, fasting

## 2016-11-26 ENCOUNTER — Encounter: Payer: Self-pay | Admitting: Internal Medicine

## 2016-11-26 ENCOUNTER — Other Ambulatory Visit: Payer: Medicare Other

## 2016-11-26 ENCOUNTER — Telehealth: Payer: Self-pay | Admitting: Internal Medicine

## 2016-11-26 LAB — CBC WITH DIFFERENTIAL/PLATELET
Basophils Absolute: 0 K/uL (ref 0.0–0.1)
Basophils Relative: 0.8 % (ref 0.0–3.0)
Eosinophils Absolute: 0.1 K/uL (ref 0.0–0.7)
Eosinophils Relative: 2.1 % (ref 0.0–5.0)
HCT: 38.8 % (ref 36.0–46.0)
Hemoglobin: 13.1 g/dL (ref 12.0–15.0)
Lymphocytes Relative: 29.3 % (ref 12.0–46.0)
Lymphs Abs: 1.7 K/uL (ref 0.7–4.0)
MCHC: 33.8 g/dL (ref 30.0–36.0)
MCV: 89.6 fl (ref 78.0–100.0)
Monocytes Absolute: 0.3 K/uL (ref 0.1–1.0)
Monocytes Relative: 5.9 % (ref 3.0–12.0)
Neutro Abs: 3.5 K/uL (ref 1.4–7.7)
Neutrophils Relative %: 61.9 % (ref 43.0–77.0)
Platelets: 438 K/uL — ABNORMAL HIGH (ref 150.0–400.0)
RBC: 4.34 Mil/uL (ref 3.87–5.11)
RDW: 13.2 % (ref 11.5–15.5)
WBC: 5.7 K/uL (ref 4.0–10.5)

## 2016-11-26 LAB — IRON: Iron: 56 ug/dL (ref 42–145)

## 2016-11-26 LAB — BASIC METABOLIC PANEL
BUN: 12 mg/dL (ref 6–23)
CO2: 27 mEq/L (ref 19–32)
Calcium: 10.2 mg/dL (ref 8.4–10.5)
Chloride: 99 mEq/L (ref 96–112)
Creatinine, Ser: 0.71 mg/dL (ref 0.40–1.20)
GFR: 86.06 mL/min (ref >60.00)
Glucose, Bld: 87 mg/dL (ref 70–99)
Potassium: 3.8 mEq/L (ref 3.5–5.1)
Sodium: 135 mEq/L (ref 135–145)

## 2016-11-26 LAB — LIPID PANEL
Cholesterol: 154 mg/dL (ref 0–200)
HDL: 56.9 mg/dL (ref >39.00)
LDL Cholesterol: 76 mg/dL (ref 0–99)
NonHDL: 96.92
Total CHOL/HDL Ratio: 3
Triglycerides: 103 mg/dL (ref 0.0–149.0)
VLDL: 20.6 mg/dL (ref 0.0–40.0)

## 2016-11-26 LAB — TSH: TSH: 2.73 u[IU]/mL (ref 0.35–4.50)

## 2016-11-26 LAB — FERRITIN: Ferritin: 57.8 ng/mL (ref 10.0–291.0)

## 2016-11-26 MED ORDER — RALOXIFENE HCL 60 MG PO TABS: 60.0000 mg | ORAL_TABLET | Freq: Every day | ORAL | 5 refills | Status: DC

## 2016-11-26 MED ORDER — FINASTERIDE 5 MG PO TABS: 5.0000 mg | ORAL_TABLET | Freq: Every day | ORAL | 5 refills | Status: DC

## 2016-11-26 MED ORDER — SIMVASTATIN 20 MG PO TABS: 20.0000 mg | ORAL_TABLET | Freq: Every day | ORAL | 5 refills | Status: DC

## 2016-11-26 MED ORDER — AMLODIPINE BESY-BENAZEPRIL HCL 5-10 MG PO CAPS: 1.0000 | ORAL_CAPSULE | Freq: Every day | ORAL | 5 refills | Status: DC

## 2016-11-26 NOTE — Telephone Encounter (Signed)
Rxs sent

## 2016-11-26 NOTE — Assessment & Plan Note (Addendum)
HTN: Continue Lotrel, checking labs Hyperlipidemia: Continue simvastatin, checking labs Osteopenia: On calcium, vitamin D,  Evista d/t FH breast ca. Check a bone density test Anxiety: On clonazepam, contract and UDS today. S/p Colitis: Improved, on 2 probiotics. GI appointment pending. Recent hemoglobin low, check a CBC, iron and ferritin. RTC 6 months

## 2016-11-26 NOTE — Telephone Encounter (Signed)
Relation to ZO:XWRUpt:self Call back number:[phone] Pharmacy: [id number] - [place], [place] - [address] AT [address]OSS [address] [phone] ([phone])     Reason for call:  Patient states she had her physical and would like "all" her medications refill, please advise

## 2016-11-27 ENCOUNTER — Ambulatory Visit (HOSPITAL_BASED_OUTPATIENT_CLINIC_OR_DEPARTMENT_OTHER)
Admission: RE | Admit: 2016-11-27 | Discharge: 2016-11-27 | Disposition: A | Discharge status: Home / Self Care | Payer: Medicare Other | Source: Ambulatory Visit | Attending: Internal Medicine | Admitting: Internal Medicine

## 2016-11-27 DIAGNOSIS — M85851 Other specified disorders of bone density and structure, right thigh: Secondary | ICD-10-CM | POA: Diagnosis not present

## 2016-11-27 DIAGNOSIS — Z780 Asymptomatic menopausal state: Secondary | ICD-10-CM | POA: Insufficient documentation

## 2016-12-02 ENCOUNTER — Encounter: Payer: Self-pay | Admitting: Internal Medicine

## 2016-12-11 LAB — HM MAMMOGRAPHY

## 2016-12-12 ENCOUNTER — Encounter: Payer: Self-pay | Admitting: Gastroenterology

## 2016-12-12 ENCOUNTER — Ambulatory Visit (INDEPENDENT_AMBULATORY_CARE_PROVIDER_SITE_OTHER): Payer: Medicare Other | Admitting: Gastroenterology

## 2016-12-12 VITALS — BP 146/64 | HR 80 | Ht 63.5 in | Wt 130.5 lb

## 2016-12-12 DIAGNOSIS — K529 Noninfective gastroenteritis and colitis, unspecified: Secondary | ICD-10-CM

## 2016-12-12 DIAGNOSIS — R197 Diarrhea, unspecified: Secondary | ICD-10-CM

## 2016-12-12 MED ORDER — NA SULFATE-K SULFATE-MG SULF 17.5-3.13-1.6 GM/177ML PO SOLN: 1.0000 | Freq: Once | ORAL | 0 refills | Status: AC

## 2016-12-12 NOTE — Progress Notes (Signed)
Linda MinersKathleen L Jefferson    [id number]    08/04/1946  Primary Care Physician:Linda Drue NovelPaz, MD  Referring Physician: Wanda PlumpJose E Paz, MD [address] [place], [place] [id number]  Chief complaint:  Colitis  HPI: 71 year old female with history of hypertension, anxiety disorder here for new patient visit. She was recently hospitalized with diarrhea and blood in stool in January 2018, C. difficile and GI pathogen panel was negative. On CT abdomen and pelvis there was inflammatory changes and circumferential thickening of the distal half of the transverse colon and entire descending colon compatible with colitis. Differential was infectious, inflammatory and ischemic colitis. She was discharged home to complete a course of Augmentin for a week . She was also started on probiotic Saccharomyces Boulardi. She is currently having normal soft bowel movements, denies any diarrhea or bloating since she completed course of antibiotics.  Had a similar episode of diarrhea after colonoscopy in 2009, at that time her symptoms had lasted for almost 2 weeks, was not hospitalized but did go to the ER and was given IV fluids, CT abdomen and pelvis showed thickening with surrounding inflammation of the colon from transverse to sigmoid.  She hasn't had any episodes of diarrhea, or abdominal pain in the past 9 years prior to the recent hospitalization.    Outpatient Encounter Prescriptions as of 12/12/2016  Medication Sig  . albuterol (PROVENTIL HFA;VENTOLIN HFA) 108 (90 BASE) MCG/ACT inhaler Inhale 2 puffs into the lungs every 6 (six) hours as needed for wheezing or shortness of breath.  Marland Kitchen. amLODipine-benazepril (LOTREL) 5-10 MG capsule Take 1 capsule by mouth daily.  . Artificial Tear Ointment (DRY EYES OP) Place 2 drops into both eyes as needed (for dry eyes).  Marland Kitchen. aspirin EC 81 MG tablet Take 81 mg by mouth daily.  . cetirizine (ZYRTEC) 10 MG tablet Take 10 mg by mouth daily.  . clonazePAM (KLONOPIN)  0.5 MG tablet Take 1 tablet (0.5 mg total) by mouth 3 (three) times daily as needed for anxiety.  . diclofenac (VOLTAREN) 75 MG EC tablet Take 75 mg by mouth 2 (two) times daily as needed for moderate pain.   . finasteride (PROSCAR) 5 MG tablet Take 1 tablet (5 mg total) by mouth daily.  . fluocinonide (LIDEX) 0.05 % external solution Apply 1 application topically every evening.  . fluticasone (FLONASE) 50 MCG/ACT nasal spray Place 2 sprays into both nostrils daily. (Patient taking differently: Place 2 sprays into both nostrils daily as needed for allergies. )  . Multiple Vitamin (MULTIVITAMIN) capsule Take 1 capsule by mouth daily.    . Probiotic Product (PROBIOTIC DAILY PO) Take 1 tablet by mouth daily.  . raloxifene (EVISTA) 60 MG tablet Take 1 tablet (60 mg total) by mouth daily.  Marland Kitchen. saccharomyces boulardii (FLORASTOR) 250 MG capsule Take 1 capsule (250 mg total) by mouth 2 (two) times daily.  . simethicone (MYLICON) 80 MG chewable tablet Chew 1 tablet (80 mg total) by mouth every 6 (six) hours as needed for flatulence.  . simvastatin (ZOCOR) 20 MG tablet Take 1 tablet (20 mg total) by mouth at bedtime.   No facility-administered encounter medications on file as of 12/12/2016.     Allergies as of 12/12/2016 - Review Complete 12/12/2016  Allergen Reaction Noted  . Mushroom extract complex Shortness Of Breath 08/09/2015  . Shellfish allergy Anaphylaxis 08/03/2014  . Oxycodone Itching and Other (See Comments) 07/05/2016  . Eggs or egg-derived products Other (  See Comments) 09/02/2012  . Nyquil multi-symptom [pseudoeph-doxylamine-dm-apap]  12/12/2016  . Pseudoeph-doxylamine-dm-apap Other (See Comments) 05/20/2008  . Tape Rash and Other (See Comments) 04/05/2016    Past Medical History:  Diagnosis Date  . Anxiety   . Carotid artery disease (HCC)    per u/s 01/2009- repeated u/s 7/11 "mild dz, no stenosis"  . Colitis   . Dyslipidemia   . Food allergy    mushrooms  . Gallstones   .  Hyperlipidemia   . Hypertension   . LBP (low back pain) 3/11   after a local injection, f/u [facility]  . Normal cardiac stress test 04/2009  . Osteopenia   . Seasonal allergies   . 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 Wears hearing aid     Past Surgical History:  Procedure Laterality Date  . BUNIONECTOMY Bilateral 03/2013  . CARPAL TUNNEL RELEASE Right   . CHOLECYSTECTOMY    . TOTAL SHOULDER ARTHROPLASTY Left 04/2016  . TRIGGER FINGER RELEASE  03/31/2012   Procedure: RELEASE TRIGGER FINGER/A-1 PULLEY;  Surgeon: Wyn Forster., MD;  Location: [facility];  Service: Orthopedics;  Laterality: Right;  right index    Family History  Problem Relation Age of Onset  . Coronary artery disease Mother 25  . Stroke Mother   . Diabetes Mother   . Breast cancer Mother     M, 2 sisters, 1 aunt  . Hypertension Other     M family  . Colon cancer Neg Hx     Social History   Social History  . Marital status: Married    Spouse name: N/A  . Number of children: 3  . Years of education: N/A   Occupational History  . TEACHER-- retired    .  [employer]   Social History Main Topics  . Smoking status: Never Smoker  . Smokeless tobacco: Never Used  . Alcohol use No     Comment: socially. Not since Jan 23  . Drug use: No  . Sexual activity: No   Other Topics Concern  . Not on file   Social History Narrative      3 children, one in [place] (PhD)                     Review of systems: Review of Systems  Constitutional: Negative for fever and chills.  HENT: Negative.   Eyes: Negative for blurred vision.  Respiratory: Negative for cough, shortness of breath and wheezing.   Cardiovascular: Negative for chest pain and palpitations.  Gastrointestinal: as per HPI Genitourinary: Negative for dysuria, urgency, frequency and hematuria.  Musculoskeletal: Negative for myalgias, back pain and joint pain.  Skin: Negative for itching  and rash.  Neurological: Negative for dizziness, tremors, focal weakness, seizures and loss of consciousness.  Endo/Heme/Allergies: Positive for seasonal allergies.  Psychiatric/Behavioral: Negative for depression, suicidal ideas and hallucinations.  All other systems reviewed and are negative.   Physical Exam: Vitals:   12/12/16 0857  BP: (!) 146/64  Pulse: 80   Body mass index is 22.75 kg/m. Gen:      No acute distress HEENT:  EOMI, sclera anicteric Neck:     No masses; no thyromegaly Lungs:    Clear to auscultation bilaterally; normal respiratory effort CV:         Regular rate and rhythm; no murmurs Abd:      + bowel sounds; soft, non-tender; no palpable masses, no distension  Ext:    No edema; adequate peripheral perfusion Skin:      Warm and dry; no rash Neuro: alert and oriented x 3 Psych: normal mood and affect  Data Reviewed:  Reviewed labs, radiology imaging, old records and pertinent past GI work up  Colonoscopy July 2009: internal hemorrhoids otherwise no polyps. Retroflexion was not performed in the rectum  Assessment and Plan/Recommendations:  71 year old female here for evaluation after recent hospitalization with colitis has improved with antibiotics Infectious workup negative; C. difficile and GI pathogen panel was checked Patient had a salad with canned tuna prior to the episode, ?scombroid poisoning  Inflammatory bowel disease and ischemic colitis in the differential  We'll schedule for colonoscopy with biopsies to exclude IBD Patient is extremely anxious and worried if she'll have a recurrent episode of colitis status post colonoscopy She had in 2009. Reassured patient and discussed in detail the benefits and risks of the procedure and patient agrees to proceed   Greater than 50% of the time used for counseling as well as treatment plan and follow-up. She had multiple questions which were answered to her satisfaction  K. Scherry Ran , MD [phone]  Mon-Fri 8a-5p [phone] after 5p, weekends, holidays  CC: Linda Plump, MD

## 2016-12-12 NOTE — Patient Instructions (Signed)

## 2016-12-13 ENCOUNTER — Encounter: Payer: Self-pay | Admitting: Internal Medicine

## 2016-12-13 ENCOUNTER — Telehealth: Payer: Self-pay

## 2016-12-13 NOTE — Telephone Encounter (Signed)
UDS: 11/26/2016  Positive for 7-Aminoclonazepam (metabolite of clonazepam)   Low risk per PCP 12/13/2016

## 2016-12-18 ENCOUNTER — Encounter: Payer: Medicare Other | Admitting: Gastroenterology

## 2016-12-23 ENCOUNTER — Telehealth: Payer: Self-pay | Admitting: Gastroenterology

## 2016-12-23 NOTE — Telephone Encounter (Signed)
Rec'd from [facility] forward 11 pages to Dr.Nandigam

## 2016-12-25 ENCOUNTER — Encounter: Payer: Self-pay | Admitting: Gastroenterology

## 2016-12-25 ENCOUNTER — Ambulatory Visit (AMBULATORY_SURGERY_CENTER): Payer: Medicare Other | Admitting: Gastroenterology

## 2016-12-25 VITALS — BP 125/64 | HR 69 | Temp 97.1°F | Resp 14 | Ht 62.5 in | Wt 130.0 lb

## 2016-12-25 DIAGNOSIS — R197 Diarrhea, unspecified: Secondary | ICD-10-CM

## 2016-12-25 DIAGNOSIS — R933 Abnormal findings on diagnostic imaging of other parts of digestive tract: Secondary | ICD-10-CM | POA: Diagnosis not present

## 2016-12-25 MED ORDER — SODIUM CHLORIDE 0.9 % IV SOLN: 500.0000 mL | INTRAVENOUS | Status: DC

## 2016-12-25 NOTE — Progress Notes (Signed)
Pt's states no medical or surgical changes since previsit or office visit. 

## 2016-12-25 NOTE — Op Note (Signed)
[facility] Patient Name: Linda Jefferson Procedure Date: 12/25/2016 7:38 AM MRN: [id number] Endoscopist: Napoleon Form , MD Age: 71 Referring MD:  Date of Birth: January 11, 1946 Gender: Female Account #: [id number] Procedure:                Colonoscopy Indications:              Obtain more precise diagnosis of inflammatory bowel                            disease, Clinically significant diarrhea of                            unexplained origin Medicines:                Monitored Anesthesia Care Procedure:                Pre-Anesthesia Assessment:                           - Prior to the procedure, a History and Physical                            was performed, and patient medications and                            allergies were reviewed. The patient's tolerance of                            previous anesthesia was also reviewed. The risks                            and benefits of the procedure and the sedation                            options and risks were discussed with the patient.                            All questions were answered, and informed consent                            was obtained. Prior Anticoagulants: The patient has                            taken no previous anticoagulant or antiplatelet                            agents. ASA Grade Assessment: II - A patient with                            mild systemic disease. After reviewing the risks                            and benefits, the patient was deemed in  satisfactory condition to undergo the procedure.                           After obtaining informed consent, the colonoscope                            was passed under direct vision. Throughout the                            procedure, the patient's blood pressure, pulse, and                            oxygen saturations were monitored continuously. The                            Colonoscope was introduced through the  anus and                            advanced to the the cecum, identified by                            appendiceal orifice and ileocecal valve. The                            colonoscopy was performed without difficulty. The                            patient tolerated the procedure well. The quality                            of the bowel preparation was excellent. The                            ileocecal valve, appendiceal orifice, and rectum                            were photographed. Scope In: 8:26:12 AM Scope Out: 8:48:52 AM Scope Withdrawal Time: 0 hours 18 minutes 13 seconds  Total Procedure Duration: 0 hours 22 minutes 40 seconds  Findings:                 The perianal and digital rectal examinations were                            normal.                           Normal mucosa was found in the entire colon.                            Biopsies were taken with a cold forceps for                            histology from cecum, ascending, transverse,  descending, sigmoid colon and rectum .                           Non-bleeding internal hemorrhoids were found during                            retroflexion. The hemorrhoids were medium-sized.                           The exam was otherwise without abnormality. Complications:            No immediate complications. Estimated Blood Loss:     Estimated blood loss was minimal. Impression:               - Normal mucosa in the entire examined colon.                            Biopsied.                           - Non-bleeding internal hemorrhoids.                           - The examination was otherwise normal. Recommendation:           - Patient has a contact number available for                            emergencies. The signs and symptoms of potential                            delayed complications were discussed with the                            patient. Return to normal activities tomorrow.                             Written discharge instructions were provided to the                            patient.                           - Resume previous diet.                           - Continue present medications.                           - Await pathology results.                           - Repeat colonoscopy date to be determined after                            pending pathology results are reviewed for  surveillance based on pathology results.                           - Return to GI clinic PRN. Napoleon Form, MD 12/25/2016 8:55:48 AM This report has been signed electronically.

## 2016-12-25 NOTE — Patient Instructions (Signed)
YOU HAD AN ENDOSCOPIC PROCEDURE TODAY AT THE [facility]:   Refer to the procedure report that was given to you for any specific questions about what was found during the examination.  If the procedure report does not answer your questions, please call your gastroenterologist to clarify.  If you requested that your care partner not be given the details of your procedure findings, then the procedure report has been included in a sealed envelope for you to review at your convenience later.  YOU SHOULD EXPECT: Some feelings of bloating in the abdomen. Passage of more gas than usual.  Walking can help get rid of the air that was put into your GI tract during the procedure and reduce the bloating. If you had a lower endoscopy (such as a colonoscopy or flexible sigmoidoscopy) you may notice spotting of blood in your stool or on the toilet paper. If you underwent a bowel prep for your procedure, you may not have a normal bowel movement for a few days.  Please Note:  You might notice some irritation and congestion in your nose or some drainage.  This is from the oxygen used during your procedure.  There is no need for concern and it should clear up in a day or so.  SYMPTOMS TO REPORT IMMEDIATELY:   Following lower endoscopy (colonoscopy or flexible sigmoidoscopy):  Excessive amounts of blood in the stool  Significant tenderness or worsening of abdominal pains  Swelling of the abdomen that is new, acute  Fever of 100F or higher   For urgent or emergent issues, a gastroenterologist can be reached at any hour by calling [phone].   DIET:  We do recommend a small meal at first, but then you may proceed to your regular diet.  Drink plenty of fluids but you should avoid alcoholic beverages for 24 hours.  ACTIVITY:  You should plan to take it easy for the rest of today and you should NOT DRIVE or use heavy machinery until tomorrow (because of the sedation medicines used during the test).     FOLLOW UP: Our staff will call the number listed on your records the next business day following your procedure to check on you and address any questions or concerns that you may have regarding the information given to you following your procedure. If we do not reach you, we will leave a message.  However, if you are feeling well and you are not experiencing any problems, there is no need to return our call.  We will assume that you have returned to your regular daily activities without incident.  If any biopsies were taken you will be contacted by phone or by letter within the next 1-3 weeks.  Please call us at [phone] if you have not heard about the biopsies in 3 weeks.    SIGNATURES/CONFIDENTIALITY: You and/or your care partner have signed paperwork which will be entered into your electronic medical record.  These signatures attest to the fact that that the information above on your After Visit Summary has been reviewed and is understood.  Full responsibility of the confidentiality of this discharge information lies with you and/or your care-partner.    Handout was given to your care partner on hemorrhoids. You may resume your current medications today. Await biopsy results. Please call if any questions or concerns.   

## 2016-12-25 NOTE — Progress Notes (Signed)
To recovery, report to Willis, RN, VSS 

## 2016-12-25 NOTE — Progress Notes (Signed)
No problems noted in the recovery room. maw 

## 2016-12-25 NOTE — Progress Notes (Signed)
Pt has a bruise at right wrist were unsuccessful IV attempt.  Pt c/o "this really smarts".  I asked pt if she would like a warm pack to place on her right wrist.  Pt said she would like that. Warm pack given to pt and she placed it on her right wrist.  Pt said this felt good. maw

## 2016-12-25 NOTE — Progress Notes (Signed)
Called to room to assist during endoscopic procedure.  Patient ID and intended procedure confirmed with present staff. Received instructions for my participation in the procedure from the performing physician.  

## 2016-12-26 ENCOUNTER — Telehealth: Payer: Self-pay | Admitting: *Deleted

## 2016-12-26 NOTE — Telephone Encounter (Signed)
  Follow up Call-  Call back number 12/25/2016  Post procedure Call Back phone  # [phone]  Permission to leave phone message Yes  Some recent data might be hidden     Patient questions:  Do you have a fever, pain , or abdominal swelling? No. Pain Score  0 *  Have you tolerated food without any problems? Yes.    Have you been able to return to your normal activities? Yes.    Do you have any questions about your discharge instructions: Diet   No. Medications  No. Follow up visit  No.  Do you have questions or concerns about your Care? No.  Actions: * If pain score is 4 or above: No action needed, pain <4.

## 2016-12-26 NOTE — Telephone Encounter (Signed)
No answer. Name identifier. Message left to call if questions or concerns and we would attempt to call a little later in the day.

## 2017-01-06 ENCOUNTER — Encounter: Payer: Self-pay | Admitting: Gastroenterology

## 2017-01-06 ENCOUNTER — Telehealth: Payer: Self-pay | Admitting: Gastroenterology

## 2017-01-06 NOTE — Telephone Encounter (Signed)
Read the letter to the patient. She is transitioning from a bland diet to regular foods. She is slowly adding foods back to her diet.

## 2017-01-12 ENCOUNTER — Encounter: Payer: Self-pay | Admitting: Gastroenterology

## 2017-01-14 ENCOUNTER — Telehealth: Payer: Self-pay

## 2017-01-14 NOTE — Telephone Encounter (Signed)
Call back number [phone]  Patient stated that she needs lidocaine patches for her back and also for her husband. She stated she goes to pain management but does not want to go back because of her own reasons.   Please advise

## 2017-01-14 NOTE — Telephone Encounter (Signed)
Please find out exactly how the patches were prescribed. 

## 2017-01-14 NOTE — Telephone Encounter (Signed)
Please advise 

## 2017-01-15 ENCOUNTER — Encounter: Payer: Self-pay | Admitting: Internal Medicine

## 2017-01-15 NOTE — Telephone Encounter (Signed)
LMOM informing Pt that we would need to know if pain management had previously prescribed lidocaine patches before, if so, how it was prescribed. Also informed if she is requesting a new prescription for lidocaine patches, PCP will need to see her in office to evaluate or she can use OTC Salonpas lidocaine patches. Instructed to call if questions/concerns.

## 2017-02-08 ENCOUNTER — Other Ambulatory Visit: Payer: Self-pay | Admitting: Internal Medicine

## 2017-02-10 NOTE — Telephone Encounter (Signed)
Rx faxed to [facility] pharmacy.  

## 2017-02-10 NOTE — Telephone Encounter (Signed)
Pt is requesting refill on Clonazepam.  Last OV: 11/25/2016 Last Fill: 07/05/2016 #90 and 3RF Pt sig:1 tab TID PRN UDS: 11/26/2016 Low risk  Please advise.

## 2017-02-10 NOTE — Telephone Encounter (Signed)
Ok 90 and 3 RF 

## 2017-02-10 NOTE — Telephone Encounter (Signed)
Rx printed, awaiting MD signature.  

## 2017-04-08 ENCOUNTER — Other Ambulatory Visit: Payer: Self-pay

## 2017-04-08 MED ORDER — AMLODIPINE BESY-BENAZEPRIL HCL 5-10 MG PO CAPS: 1.0000 | ORAL_CAPSULE | Freq: Every day | ORAL | 0 refills | Status: DC

## 2017-05-26 ENCOUNTER — Encounter: Payer: Self-pay | Admitting: Internal Medicine

## 2017-05-26 ENCOUNTER — Ambulatory Visit (INDEPENDENT_AMBULATORY_CARE_PROVIDER_SITE_OTHER): Payer: Medicare Other | Admitting: Internal Medicine

## 2017-05-26 VITALS — BP 118/72 | HR 67 | Temp 98.1°F | Resp 14 | Ht 63.0 in | Wt 129.0 lb

## 2017-05-26 DIAGNOSIS — E7849 Other hyperlipidemia: Secondary | ICD-10-CM

## 2017-05-26 DIAGNOSIS — E784 Other hyperlipidemia: Secondary | ICD-10-CM

## 2017-05-26 DIAGNOSIS — I10 Essential (primary) hypertension: Secondary | ICD-10-CM

## 2017-05-26 DIAGNOSIS — F32A Depression, unspecified: Secondary | ICD-10-CM

## 2017-05-26 DIAGNOSIS — F419 Anxiety disorder, unspecified: Secondary | ICD-10-CM

## 2017-05-26 DIAGNOSIS — L659 Nonscarring hair loss, unspecified: Secondary | ICD-10-CM

## 2017-05-26 DIAGNOSIS — F329 Major depressive disorder, single episode, unspecified: Secondary | ICD-10-CM | POA: Diagnosis not present

## 2017-05-26 LAB — BASIC METABOLIC PANEL
BUN: 20 mg/dL (ref 6–23)
CO2: 30 mEq/L (ref 19–32)
Calcium: 9.7 mg/dL (ref 8.4–10.5)
Chloride: 101 mEq/L (ref 96–112)
Creatinine, Ser: 0.98 mg/dL (ref 0.40–1.20)
GFR: 59.25 mL/min — ABNORMAL LOW (ref >60.00)
Glucose, Bld: 93 mg/dL (ref 70–99)
Potassium: 4 mEq/L (ref 3.5–5.1)
Sodium: 136 mEq/L (ref 135–145)

## 2017-05-26 LAB — ALT: ALT: 24 U/L (ref 0–35)

## 2017-05-26 LAB — AST: AST: 29 U/L (ref 0–37)

## 2017-05-26 NOTE — Patient Instructions (Signed)
GO TO THE LAB : Get the blood work     GO TO THE FRONT DESK Schedule your next appointment for a  physical exam in 6 months. Consider Medicare wellness with one of our nurses

## 2017-05-26 NOTE — Progress Notes (Signed)
Subjective:    Patient ID: Linda Jefferson, female    DOB: [birth year], 71 y.o.   MRN: [id number]  DOS:  05/26/2017 Type of visit - description : rov Interval history: HTN: Good medication compliance, due for labs High cholesterol: Good med compliance. Anxiety: On clonazepam, anxiety per se controlled but  she is also somewhat depressed. Reports is "personal" and would like to talk with a counselor.    Review of Systems Denies suicidal ideas. Denies chest pain or difficulty breathing, no nausea, vomiting, diarrhea  Past Medical History:  Diagnosis Date  . Anxiety   . Carotid artery disease (HCC)    per u/s 01/2009- repeated u/s 7/11 "mild dz, no stenosis"  . Colitis   . Dyslipidemia   . Food allergy    mushrooms  . Gallstones   . Hyperlipidemia   . Hypertension   . LBP (low back pain) 3/11   after a local injection, f/u [facility]  . Normal cardiac stress test 04/2009  . Osteopenia   . Seasonal allergies   . 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 Wears hearing aid     Past Surgical History:  Procedure Laterality Date  . BUNIONECTOMY Bilateral 03/2013  . CARPAL TUNNEL RELEASE Right   . CHOLECYSTECTOMY    . TOTAL SHOULDER ARTHROPLASTY Left 04/2016  . TRIGGER FINGER RELEASE  03/31/2012   Procedure: RELEASE TRIGGER FINGER/A-1 PULLEY;  Surgeon: Wyn Forster., MD;  Location: [facility];  Service: Orthopedics;  Laterality: Right;  right index    Social History   Social History  . Marital status: Married    Spouse name: N/A  . Number of children: 3  . Years of education: N/A   Occupational History  . TEACHER-- retired    .  [employer]   Social History Main Topics  . Smoking status: Never Smoker  . Smokeless tobacco: Never Used  . Alcohol use 1.2 oz/week    2 Glasses of wine per week     Comment: socially wine  . Drug use: No  . Sexual activity: No   Other Topics Concern  . Not on file   Social History  Narrative   Household: pt and husband   3 children, one in [place] (PhD)                     Allergies as of 05/26/2017      Reactions   Mushroom Extract Complex Shortness Of Breath   Shellfish Allergy Anaphylaxis   Oxycodone Itching, Other (See Comments)   "skin crawling"   Eggs Or Egg-derived Products Other (See Comments)   Only eats egg whites. Egg yolk causes nausea.   Nyquil Multi-symptom [pseudoeph-doxylamine-dm-apap]    Pseudoeph-doxylamine-dm-apap Other (See Comments)   High blood pressure.   Tape Rash, Other (See Comments)   Patient states use paper tape only. All other tapes cause rash.      Medication List       Accurate as of 05/26/17 12:56 PM. Always use your most recent med list.          albuterol 108 (90 Base) MCG/ACT inhaler Commonly known as:  PROVENTIL HFA;VENTOLIN HFA Inhale 2 puffs into the lungs every 6 (six) hours as needed for wheezing or shortness of breath.   amLODipine-benazepril 5-10 MG capsule Commonly known as:  LOTREL Take 1 capsule by mouth daily.   aspirin EC 81 MG tablet Take 81 mg by  mouth daily.   cetirizine 10 MG tablet Commonly known as:  ZYRTEC Take 10 mg by mouth daily.   clonazePAM 0.5 MG tablet Commonly known as:  KLONOPIN Take 1 tablet (0.5 mg total) by mouth 3 (three) times daily as needed for anxiety.   DRY EYES OP Place 2 drops into both eyes as needed (for dry eyes).   finasteride 5 MG tablet Commonly known as:  PROSCAR Take 1 tablet (5 mg total) by mouth daily.   fluocinonide 0.05 % external solution Commonly known as:  LIDEX Apply 1 application topically every evening.   fluticasone 50 MCG/ACT nasal spray Commonly known as:  FLONASE Place 2 sprays into both nostrils daily.   multivitamin capsule Take 1 capsule by mouth daily.   PLAQUENIL 200 MG tablet Generic drug:  hydroxychloroquine Take 1 tablet (200 mg total) by mouth 2 (two) times daily.   PROBIOTIC DAILY PO Take 1 tablet by mouth  daily.   raloxifene 60 MG tablet Commonly known as:  EVISTA Take 1 tablet (60 mg total) by mouth daily.   saccharomyces boulardii 250 MG capsule Commonly known as:  FLORASTOR Take 1 capsule (250 mg total) by mouth 2 (two) times daily.   simethicone 80 MG chewable tablet Commonly known as:  MYLICON Chew 1 tablet (80 mg total) by mouth every 6 (six) hours as needed for flatulence.   simvastatin 20 MG tablet Commonly known as:  ZOCOR Take 1 tablet (20 mg total) by mouth at bedtime.          Objective:   Physical Exam BP 118/72 (BP Location: Left Arm, Patient Position: Sitting, Cuff Size: Small)   Pulse 67   Temp 98.1 F (36.7 C) (Oral)   Resp 14   Ht 5\' 3"  (1.6 m)   Wt 129 lb (58.5 kg)   SpO2 98%   BMI 22.85 kg/m  General:   Well developed, well nourished . NAD.  HEENT:  Normocephalic . Face symmetric, atraumatic Lungs:  CTA B Normal respiratory effort, no intercostal retractions, no accessory muscle use. Heart: RRR,  no murmur.  no pretibial edema bilaterally  Abdomen:  Not distended, soft, non-tender. No rebound or rigidity.  Skin: Not pale. Not jaundice Neurologic:  alert & oriented X3.  Speech normal, gait appropriate for age and unassisted Psych--  Cognition and judgment appear intact.  Cooperative with normal attention span and concentration.  Behavior appropriate. No anxious or depressed appearing.    Assessment & Plan:   Assessment HTN Hyperlipidemia Osteopenia  Per dexa 2011,  2014 (Tt score  - 1.7), and 11/2016. On Evista d/t FH breast ca Anxiety-- on clonazepam prn Seasonal (and food?) allergies DERM: Alopecia Female pattern, lichen planopilaris; saw derm 04-2017   HOH, hearing aids H/o Syncope, 2010:   --Nl  stress test, Holter: Atrial tachycardia --Carotid ultrasound 2010,2011 , 2014 no significant stenosis +FH CAD, mother age 34 + FH Breast ca- mother, sister x2, aunt --- on Evista  H/o blood in stools, Cscope 2009, (-) except for  hemorrhoids  admitted colitis 10-2016  PLAN: HTN: Seems well-controlled. Continue amlodipine, benazepril. Check a BMP High cholesterol: Continue simvastatin, checking LFTs Allopecia: dermatology note from July 2018, DX telogen affluvium, Alopecia Female pattern, lichen planopilaris;  In addition to finasteride, minoxidil topical, plaquenil, they  added spironolactone but apparently patient is not taking it Anxiety: On clonazepam. UDS, contract today Depression: new issue, denies suicidal ideas, states is "personal" and would like to talk with a counselor. Information provided. Knows to  call me if symptoms increase or if she ever feels she needs meds. RTC 6 months, CPX

## 2017-05-26 NOTE — Assessment & Plan Note (Signed)
HTN: Seems well-controlled. Continue amlodipine, benazepril. Check a BMP High cholesterol: Continue simvastatin, checking LFTs Allopecia: dermatology note from July 2018, DX telogen affluvium, Alopecia Female pattern, lichen planopilaris;  In addition to finasteride, minoxidil topical, plaquenil, they  added spironolactone but apparently patient is not taking it Anxiety: On clonazepam. UDS, contract today Depression: new issue, denies suicidal ideas, states is "personal" and would like to talk with a counselor. Information provided. Knows to call me if symptoms increase or if she ever feels she needs meds. RTC 6 months, CPX

## 2017-05-26 NOTE — Progress Notes (Signed)
Pre visit review using our clinic review tool, if applicable. No additional management support is needed unless otherwise documented below in the visit note. 

## 2017-05-28 ENCOUNTER — Encounter: Payer: Self-pay | Admitting: Internal Medicine

## 2017-05-28 MED ORDER — FINASTERIDE 5 MG PO TABS: 5.0000 mg | ORAL_TABLET | Freq: Every day | ORAL | 2 refills | Status: DC

## 2017-05-28 MED ORDER — AMLODIPINE BESY-BENAZEPRIL HCL 5-10 MG PO CAPS: 1.0000 | ORAL_CAPSULE | Freq: Every day | ORAL | 2 refills | Status: DC

## 2017-05-28 MED ORDER — RALOXIFENE HCL 60 MG PO TABS: 60.0000 mg | ORAL_TABLET | Freq: Every day | ORAL | 2 refills | Status: DC

## 2017-05-28 MED ORDER — SIMVASTATIN 20 MG PO TABS: 20.0000 mg | ORAL_TABLET | Freq: Every day | ORAL | 2 refills | Status: DC

## 2017-06-03 ENCOUNTER — Encounter: Payer: Self-pay | Admitting: Internal Medicine

## 2017-06-03 NOTE — Telephone Encounter (Signed)
Melissa-- please advise in PCP's absence? 

## 2017-06-24 ENCOUNTER — Encounter: Payer: Self-pay | Admitting: Family Medicine

## 2017-06-24 ENCOUNTER — Encounter: Payer: Self-pay | Admitting: Internal Medicine

## 2017-06-24 ENCOUNTER — Ambulatory Visit (INDEPENDENT_AMBULATORY_CARE_PROVIDER_SITE_OTHER): Payer: Medicare Other | Admitting: Family Medicine

## 2017-06-24 ENCOUNTER — Ambulatory Visit: Payer: Medicare Other | Admitting: Family Medicine

## 2017-06-24 DIAGNOSIS — M545 Low back pain, unspecified: Secondary | ICD-10-CM

## 2017-06-24 MED ORDER — PREDNISONE 5 MG PO TABS: ORAL_TABLET | ORAL | 0 refills | Status: DC

## 2017-06-24 MED ORDER — CYCLOBENZAPRINE HCL 5 MG PO TABS: 5.0000 mg | ORAL_TABLET | Freq: Three times a day (TID) | ORAL | 1 refills | Status: DC | PRN

## 2017-06-24 MED ORDER — NAPROXEN 500 MG PO TABS: 500.0000 mg | ORAL_TABLET | Freq: Two times a day (BID) | ORAL | 0 refills | Status: DC | PRN

## 2017-06-24 NOTE — Assessment & Plan Note (Addendum)
This pain seems to be associated with a muscle spasm. She has been moving things around her house which seems to exacerbate her problem. She had some complaints of sciatic type symptoms but none today. She is tried ibuprofen with limited improvement. Symptoms do not seem to be associated with any nerve compression. - Try prednisone for 6 day course - Naproxen and Flexeril said then to be used after this. - Provided home exercise therapy. - If no improvement can follow-up for trigger point injection and/or referral to physical therapy. - Can consider lumbar films at her follow-up.

## 2017-06-24 NOTE — Patient Instructions (Signed)
Thank you for coming in,   Please take the steroids for 6 days and as directed with my handout.  Please start the naproxen and muscle relaxer after the steroids.  Please try obtaining a topical ointment with lidocaine to apply to the area.   please try applying heat to the affected area.  The exercises can be performed once her pain is less than a 2 out of 10.    please follow-up with me if your pain does not improve.   Please feel free to call with any questions or concerns at any time, at [phone]. --Dr. Jordan Likes

## 2017-06-24 NOTE — Telephone Encounter (Signed)
Patient scheduled with Dr. Laury Axon at 5pm, patient cant come in any earlier due to dentist appointment at 1:15pm

## 2017-06-24 NOTE — Telephone Encounter (Signed)
TB-Could you help to get this pt an appt? She sent a message via MyChart and she is hurting/plz advise/thx dmf

## 2017-06-24 NOTE — Progress Notes (Signed)
Linda Jefferson - 71 y.o. female MRN [id number]  Date of birth: 27-Jan-1946  SUBJECTIVE:  Including CC & ROS.  Chief Complaint  Patient presents with  . Back Pain    Patient is here today C/O left sided LBP that radiates down to mid-thigh that started on 9.14.18.  States she is a 9 on a 10 point pain scale.  Not sleeping well and has tried her Tens unit.     Linda Jefferson is a 71 year old female that is presenting with low back pain. This pain is occurring on the left side of her lower back but has started going to the right side. She did have some radicular symptoms going down the anterior aspect of her left leg to her knee but these have resolved. She reports the pain is burning and stinging in nature. The pain is significant. She has tried using ibuprofen with limited to no improvement. She has been moving furniture around her house recently. She denies any specific injury to her back. The pain is worse when transitioning from seated to a standing position. She has used a brace and this has helped with her pain.   I have independently reviewed her MRI lumbar spine from 05/22/2011 that shows some disc bulging at L3-4. She has central canal stenosis at L3-L4  Review of Systems  Constitutional: Negative for fever.  Musculoskeletal: Positive for back pain and myalgias. Negative for gait problem.    HISTORY: Past Medical, Surgical, Social, and Family History Reviewed & Updated per EMR.   Pertinent Historical Findings include:  Past Medical History:  Diagnosis Date  . Anxiety   . Carotid artery disease (HCC)    per u/s 01/2009- repeated u/s 7/11 "mild dz, no stenosis"  . Colitis   . Dyslipidemia   . Food allergy    mushrooms  . Gallstones   . Hyperlipidemia   . Hypertension   . LBP (low back pain) 3/11   after a local injection, f/u [facility]  . Normal cardiac stress test 04/2009  . Osteopenia   . Seasonal allergies   . 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 Wears hearing  aid     Past Surgical History:  Procedure Laterality Date  . BUNIONECTOMY Bilateral 03/2013  . CARPAL TUNNEL RELEASE Right   . CHOLECYSTECTOMY    . TOTAL SHOULDER ARTHROPLASTY Left 04/2016  . TRIGGER FINGER RELEASE  03/31/2012   Procedure: RELEASE TRIGGER FINGER/A-1 PULLEY;  Surgeon: Wyn Forster., MD;  Location: [facility];  Service: Orthopedics;  Laterality: Right;  right index    Allergies  Allergen Reactions  . Mushroom Extract Complex Shortness Of Breath  . Shellfish Allergy Anaphylaxis  . Oxycodone Itching and Other (See Comments)    "skin crawling"  . Eggs Or Egg-Derived Products Other (See Comments)    Only eats egg whites. Egg yolk causes nausea.  . Nyquil Multi-Symptom [Pseudoeph-Doxylamine-Dm-Apap]   . Pseudoeph-Doxylamine-Dm-Apap Other (See Comments)    High blood pressure.  . Tape Rash and Other (See Comments)    Patient states use paper tape only. All other tapes cause rash.    Family History  Problem Relation Age of Onset  . Hypertension Father   . Other Father        cerebral hemorrhage  . Coronary artery disease Mother 3  . Stroke Mother   . Diabetes Mother   . Breast cancer Mother   . Hypertension Mother   . Hypertension Sister   .  Breast cancer Maternal Aunt        x 2  . Breast cancer Paternal Aunt   . Colon cancer Neg Hx      Social History   Social History  . Marital status: Married    Spouse name: N/A  . Number of children: 3  . Years of education: N/A   Occupational History  . TEACHER-- retired    .  [employer]   Social History Main Topics  . Smoking status: Never Smoker  . Smokeless tobacco: Never Used  . Alcohol use 1.2 oz/week    2 Glasses of wine per week     Comment: socially wine  . Drug use: No  . Sexual activity: No   Other Topics Concern  . Not on file   Social History Narrative   Household: pt and husband   3 children, one in [place] (PhD)                     PHYSICAL EXAM:  VS: BP (!) 155/63 (BP Location: Left Arm, Patient Position: Sitting, Cuff Size: Normal)   Pulse 65   Temp 98.1 F (36.7 C) (Oral)   Ht  (1.6 m)   Wt 133 lb 3.2 oz (60.4 kg)   SpO2 100%   BMI 23.60 kg/m  Physical Exam Gen: NAD, alert, cooperative with exam, well-appearing ENT: normal lips, normal nasal mucosa,  Eye: normal EOM, normal conjunctiva and lids CV:  no edema, +2 pedal pulses   Resp: no accessory muscle use, non-labored,  Skin: no rashes, no areas of induration  Neuro: normal tone, normal sensation to touch Psych:  normal insight, alert and oriented MSK:  Back: Some tenderness to palpation of the left paraspinal muscle area No tenderness to palpation over the SI joint, or right paraspinal muscle. No tenderness to palpation of the lumbar spine. Normal strength with hip flexion to resistance. Normal knee flexion and extension. Normal sensation lower extremities. Negative straight leg raise bilaterally. Normal internal and external rotation of her hips bilaterally. Neurovascularly intact       ASSESSMENT & PLAN:   Low back pain This pain seems to be associated with a muscle spasm. She has been moving things around her house which seems to exacerbate her problem. She had some complaints of sciatic type symptoms but none today. She is tried ibuprofen with limited improvement. Symptoms do not seem to be associated with any nerve compression. - Try prednisone for 6 day course - Naproxen and Flexeril said then to be used after this. - Provided home exercise therapy. - If no improvement can follow-up for trigger point injection and/or referral to physical therapy. - Can consider lumbar films at her follow-up.

## 2017-06-29 ENCOUNTER — Encounter: Payer: Self-pay | Admitting: Family Medicine

## 2017-07-01 ENCOUNTER — Other Ambulatory Visit: Payer: Self-pay | Admitting: Family Medicine

## 2017-07-01 MED ORDER — TIZANIDINE HCL 2 MG PO CAPS: 2.0000 mg | ORAL_CAPSULE | Freq: Three times a day (TID) | ORAL | 0 refills | Status: DC

## 2017-07-01 NOTE — Progress Notes (Signed)
Not having improvement with flexeril so will stop and Tizanidine sent in   Myra Rude, MD [facility] 07/01/2017, 12:56 PM

## 2017-07-04 ENCOUNTER — Encounter: Payer: Self-pay | Admitting: Internal Medicine

## 2017-07-04 ENCOUNTER — Ambulatory Visit (HOSPITAL_BASED_OUTPATIENT_CLINIC_OR_DEPARTMENT_OTHER)
Admission: RE | Admit: 2017-07-04 | Discharge: 2017-07-04 | Disposition: A | Discharge status: Home / Self Care | Payer: Medicare Other | Source: Ambulatory Visit | Attending: Internal Medicine | Admitting: Internal Medicine

## 2017-07-04 ENCOUNTER — Telehealth: Payer: Self-pay | Admitting: Internal Medicine

## 2017-07-04 DIAGNOSIS — M545 Low back pain, unspecified: Secondary | ICD-10-CM

## 2017-07-04 DIAGNOSIS — M5136 Other intervertebral disc degeneration, lumbar region: Secondary | ICD-10-CM | POA: Insufficient documentation

## 2017-07-04 NOTE — Telephone Encounter (Addendum)
Pt states the ortho at [facility] does not do backs. Pt request a local orthopedic that does back problems. Pt also wants mri done before going to specialist so they can have the results at her first appt.  Please refer pt to different orthopedic that does back, not [facility]. Does [facility] have a back specialist?

## 2017-07-04 NOTE — Telephone Encounter (Signed)
Referral placed (Pt see's ortho in [place], [place], Dr. Roney Mans). X-rays ordered.

## 2017-07-04 NOTE — Telephone Encounter (Signed)
Pt says is coming in today for xray spine but she also wants the order for an MRI. She says she called [facility] they said it would be beneficial to have her PCP order MRI for here so they can see it in the [facility] system.

## 2017-07-04 NOTE — Telephone Encounter (Signed)
Per PCP, okay to order MRI lumber spine w/o. Order placed.

## 2017-07-04 NOTE — Telephone Encounter (Signed)
Wanda Plump, MD  Conrad Plevna, CMA        Orthopnea referral  Lumbar-sacral x-ray  DX back pain

## 2017-07-04 NOTE — Telephone Encounter (Signed)
Please order a MRI of the lumbosacral spine. DX acute back pain If pain is severe: needs to go to the ER

## 2017-07-05 ENCOUNTER — Ambulatory Visit (HOSPITAL_BASED_OUTPATIENT_CLINIC_OR_DEPARTMENT_OTHER)
Admission: RE | Admit: 2017-07-05 | Discharge: 2017-07-05 | Disposition: A | Discharge status: Home / Self Care | Payer: Medicare Other | Source: Ambulatory Visit | Attending: Internal Medicine | Admitting: Internal Medicine

## 2017-07-05 DIAGNOSIS — M48061 Spinal stenosis, lumbar region without neurogenic claudication: Secondary | ICD-10-CM | POA: Diagnosis not present

## 2017-07-05 DIAGNOSIS — M47816 Spondylosis without myelopathy or radiculopathy, lumbar region: Secondary | ICD-10-CM | POA: Insufficient documentation

## 2017-07-05 DIAGNOSIS — M545 Low back pain: Secondary | ICD-10-CM | POA: Insufficient documentation

## 2017-07-05 DIAGNOSIS — M5136 Other intervertebral disc degeneration, lumbar region: Secondary | ICD-10-CM | POA: Diagnosis not present

## 2017-07-08 ENCOUNTER — Ambulatory Visit (INDEPENDENT_AMBULATORY_CARE_PROVIDER_SITE_OTHER): Payer: Medicare Other | Admitting: Psychology

## 2017-07-08 DIAGNOSIS — F4323 Adjustment disorder with mixed anxiety and depressed mood: Secondary | ICD-10-CM

## 2017-07-15 ENCOUNTER — Ambulatory Visit (INDEPENDENT_AMBULATORY_CARE_PROVIDER_SITE_OTHER): Payer: Medicare Other | Admitting: Psychology

## 2017-07-15 DIAGNOSIS — F4323 Adjustment disorder with mixed anxiety and depressed mood: Secondary | ICD-10-CM | POA: Diagnosis not present

## 2017-07-29 ENCOUNTER — Ambulatory Visit (INDEPENDENT_AMBULATORY_CARE_PROVIDER_SITE_OTHER): Payer: Medicare Other | Admitting: Psychology

## 2017-07-29 DIAGNOSIS — F4323 Adjustment disorder with mixed anxiety and depressed mood: Secondary | ICD-10-CM

## 2017-08-02 ENCOUNTER — Encounter: Payer: Self-pay | Admitting: Internal Medicine

## 2017-08-04 MED ORDER — CLONAZEPAM 0.5 MG PO TABS: 0.5000 mg | ORAL_TABLET | Freq: Three times a day (TID) | ORAL | 3 refills | Status: DC | PRN

## 2017-08-04 NOTE — Telephone Encounter (Signed)
Rx printed, awaiting MD signature.  

## 2017-08-04 NOTE — Telephone Encounter (Signed)
Ok #90 and 3 RFs

## 2017-08-04 NOTE — Telephone Encounter (Signed)
Rx faxed to [facility] pharmacy.  

## 2017-08-04 NOTE — Telephone Encounter (Signed)
Pt is requesting refill on clonazepam 0.5mg .  Last OV: 06/24/2017 w/ Dr. Jordan LikesSchmitz Last Fill: 02/10/2017 #90 and 3RF UDS: 11/26/2016 Low risk  NCCR printed; no issues noted  Please advise.

## 2017-08-05 ENCOUNTER — Ambulatory Visit (INDEPENDENT_AMBULATORY_CARE_PROVIDER_SITE_OTHER): Payer: Medicare Other

## 2017-08-05 DIAGNOSIS — Z23 Encounter for immunization: Secondary | ICD-10-CM

## 2017-08-08 ENCOUNTER — Encounter: Payer: Self-pay | Admitting: Internal Medicine

## 2017-08-15 ENCOUNTER — Ambulatory Visit: Payer: Medicare Other

## 2017-08-24 ENCOUNTER — Encounter: Payer: Self-pay | Admitting: Internal Medicine

## 2017-09-22 ENCOUNTER — Encounter: Payer: Self-pay | Admitting: Internal Medicine

## 2017-09-23 ENCOUNTER — Telehealth: Payer: Self-pay | Admitting: Internal Medicine

## 2017-09-23 DIAGNOSIS — I10 Essential (primary) hypertension: Secondary | ICD-10-CM

## 2017-09-23 NOTE — Telephone Encounter (Signed)
Tried calling Pt- phone wouldn't connect. Will send message to Pt.

## 2017-09-23 NOTE — Telephone Encounter (Signed)
See message below, please call patient and clarify.  Is she is going to stay on spironolactone 25 mg daily or 50 mg daily? BPs  of 115/50 8/1 27/64 are not supposedly  low, most likely if she continued to have those sort of blood pressures she will not feel poorly. If she takes a spironolactone daily, needs to come to the lab and get her potassium check. If there is more questions, needs office visit. JP      From: Tressia MinersKathleen L Corprew    Sent: 09/22/2017  3:35 PM EST      To: Willow OraJose Cheyeanne Roadcap, MD Subject: Non-Urgent Medical Question  Hi Dr. Drue NovelPaz,  I have been on Spironolactone about a month but started with on half of the prescribed amount of 50mg .  I have been taking 25mg , but increased to 50 mg. this morning.  I started feeling light headed and checked my blood pressure.  It was 115/58 at 10am.  I had eaten breakfast.  I drank coffee and checked my BP at 3pm and it came up some to 127/64.   This is still too low for me.  Have been drinking water to get the med out of my system.  Think I need to get Potassium checked like you suggested.   Can your nurse call and let me know when this is possible.  Don't always check email.  My BP in November was 146/77.  I can have Rocky LinkKen drive me to office for blood test.  I'm not driving.   Thank you, Linda Jefferson  Linda Jefferson

## 2017-11-28 ENCOUNTER — Encounter: Payer: Medicare Other | Admitting: Internal Medicine

## 2017-12-03 ENCOUNTER — Encounter: Payer: Medicare Other | Admitting: Internal Medicine

## 2017-12-09 NOTE — Progress Notes (Addendum)
Subjective:   Linda Jefferson is a 72 y.o. female who presents for Medicare Annual (Subsequent) preventive examination.  Review of Systems: No ROS.  Medicare Wellness Visit. Additional risk factors are reflected in the social history.  Cardiac Risk Factors include: advanced age (>53men, >105 women);dyslipidemia;hypertension Sleep patterns: Sleeps well taking clonazepam.  Home Safety/Smoke Alarms: Feels safe in home. Smoke alarms in place.  Living environment; residence and Firearm Safety: Split level home. No issues with stairs. Seat Belt Safety/Bike Helmet: Wears seat belt.   Female:   Pap-No longer doing routine screening due to age.     Mammo-  Scheduled 12/15/17  Dexa scan- last 11/27/16-osteopenia       CCS- 12/25/16. 10 yr recall  Objective:     Vitals: BP 124/68 (BP Location: Left Arm, Patient Position: Sitting, Cuff Size: Normal)   Pulse 73   Ht 5\' 3"  (1.6 m)   Wt 129 lb 9.6 oz (58.8 kg)   SpO2 96%   BMI 22.96 kg/m   Body mass index is 22.96 kg/m.  Advanced Directives 12/12/2017 11/25/2016 11/03/2016 10/29/2016 03/27/2012  Does Patient Have a Medical Advance Directive? Yes Yes - Yes Patient does not have advance directive  Type of Advance Directive Healthcare Power of [place];Living will Living will;Healthcare Power of Attorney Living will;Healthcare Power of [employer] Power of [place];Living will -  Does patient want to make changes to medical advance directive? - No - Patient declined - - -  Copy of Healthcare Power of Attorney in Chart? No - copy requested No - copy requested No - copy requested - -    Tobacco Social History   Tobacco Use  Smoking Status Never Smoker  Smokeless Tobacco Never Used     Counseling given: Not Answered   Clinical Intake: Pain : No/denies pain   Past Medical History:  Diagnosis Date  . Anxiety   . Carotid artery disease (HCC)    per u/s 01/2009- repeated u/s 7/11 "mild dz, no stenosis"  . Colitis   . Dyslipidemia     . Food allergy    mushrooms  . Gallstones   . Hyperlipidemia   . Hypertension   . LBP (low back pain) 3/11   after a local injection, f/u [facility]  . Normal cardiac stress test 04/2009  . Osteopenia   . Seasonal allergies   . 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 Wears hearing aid    Past Surgical History:  Procedure Laterality Date  . BUNIONECTOMY Bilateral 03/2013  . CARPAL TUNNEL RELEASE Right   . CHOLECYSTECTOMY    . TOTAL SHOULDER ARTHROPLASTY Left 04/2016  . TRIGGER FINGER RELEASE  03/31/2012   Procedure: RELEASE TRIGGER FINGER/A-1 PULLEY;  Surgeon: Wyn Forster., MD;  [facility];  Service: Orthopedics;  Laterality: Right;  right index   Family History  Problem Relation Age of Onset  . Hypertension Father   . Other Father        cerebral hemorrhage  . Coronary artery disease Mother 69  . Stroke Mother   . Diabetes Mother   . Breast cancer Mother   . Hypertension Mother   . Hypertension Sister   . Breast cancer Maternal Aunt        x 2  . Breast cancer Paternal Aunt   . Colon cancer Neg Hx    Social History   Socioeconomic History  . Marital status: Married    Spouse name: None  .  Number of children: 3  . Years of education: None  . Highest education level: None  Social Needs  . Financial resource strain: None  . Food insecurity - worry: None  . Food insecurity - inability: None  . Transportation needs - medical: None  . Transportation needs - non-medical: None  Occupational History  . 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 Smoking status: Never Smoker  . Smokeless tobacco: Never Used  Substance and Sexual Activity  . Alcohol use: Yes    Alcohol/week: 1.2 oz    Types: 2 Glasses of wine per week    Comment: socially wine  . Drug use: No  . Sexual activity: No  Other Topics Concern  . None  Social History Narrative   Household: pt and husband   3  children, one in [place] (PhD)                Outpatient Encounter Medications as of 12/12/2017  Medication Sig  . amLODipine-benazepril (LOTREL) 5-10 MG capsule Take 1 capsule by mouth daily.  . Artificial Tear Ointment (DRY EYES OP) Place 2 drops into both eyes as needed (for dry eyes).  Marland Kitchen aspirin EC 81 MG tablet Take 81 mg by mouth daily.  . cetirizine (ZYRTEC) 10 MG tablet Take 10 mg by mouth daily.  . clonazePAM (KLONOPIN) 0.5 MG tablet Take 1 tablet (0.5 mg total) by mouth 3 (three) times daily as needed for anxiety.  . finasteride (PROSCAR) 5 MG tablet Take 1 tablet (5 mg total) by mouth daily.  . fluticasone (FLONASE) 50 MCG/ACT nasal spray Place 2 sprays into both nostrils daily. (Patient taking differently: Place 2 sprays into both nostrils daily as needed for allergies. )  . hydroxychloroquine (PLAQUENIL) 200 MG tablet Take 100 mg by mouth daily.   . Multiple Vitamin (MULTIVITAMIN) capsule Take 1 capsule by mouth daily.    . raloxifene (EVISTA) 60 MG tablet Take 1 tablet (60 mg total) by mouth daily.  . simvastatin (ZOCOR) 20 MG tablet Take 1 tablet (20 mg total) by mouth at bedtime.  Marland Kitchen spironolactone (ALDACTONE) 25 MG tablet Take 25 mg by mouth daily.  . fluocinonide (LIDEX) 0.05 % external solution Apply 1 application topically every evening.  . [DISCONTINUED] cyclobenzaprine (FLEXERIL) 5 MG tablet Take 1 tablet (5 mg total) by mouth 3 (three) times daily as needed for muscle spasms.  . [DISCONTINUED] naproxen (NAPROSYN) 500 MG tablet Take 1 tablet (500 mg total) by mouth 2 (two) times daily as needed. (Patient not taking: Reported on 12/12/2017)  . [DISCONTINUED] predniSONE (DELTASONE) 5 MG tablet Take 6 pills for first day, 5 pills second day, 4 pills third day, 3 pills fourth day, 2 pills the fifth day, and 1 pill sixth day.  . [DISCONTINUED] tizanidine (ZANAFLEX) 2 MG capsule Take 1 capsule (2 mg total) by mouth 3 (three) times daily.   No facility-administered encounter  medications on file as of 12/12/2017.     Activities of Daily Living In your present state of health, do you have any difficulty performing the following activities: 12/12/2017  Hearing? N  Comment wearing hearing aids  Vision? N  Comment Dr.jichca  yearly. new glasses  Difficulty concentrating or making decisions? N  Walking or climbing stairs? N  Dressing or bathing? N  Doing errands, shopping? N  Preparing Food and eating ? N  Using the Toilet? N  In the past six months, have you accidently leaked  urine? N  Do you have problems with loss of bowel control? N  Managing your Medications? N  Managing your Finances? N  Housekeeping or managing your Housekeeping? N  Some recent data might be hidden    Patient Care Team: Wanda PlumpPaz, Jose E, MD as PCP - General (Internal Medicine) Wanda PlumpPaz, Jose E, MD (Internal Medicine) Larence PenningJicha, John, OD as Referring Physician (Optometry) Jodi GeraldsGraves, John, MD as Consulting Physician (Orthopedic Surgery) Mikki SanteeBhatti, Sokun, MD (Inactive) as Consulting Physician (Allergy and Immunology)    Assessment:   This is a routine wellness examination for Nicholos JohnsKathleen. Physical assessment deferred to PCP.  Exercise Activities and Dietary recommendations Current Exercise Habits: The patient does not participate in regular exercise at present   Diet (meal preparation, eat out, water intake, caffeinated beverages, dairy products, fruits and vegetables):  Breakfast: fruit salad Lunch: PB toast Dinner: fish and vegetables    Drinks 5 glasses of water per day.  Goals    . Taking to a small beach trip.        Fall Risk Fall Risk  12/12/2017 11/25/2016 07/05/2016 08/10/2015 08/03/2014  Falls in the past year? No No No No No   Depression Screen PHQ 2/9 Scores 12/12/2017 11/25/2016 07/05/2016 08/10/2015  PHQ - 2 Score 0 0 0 0     Cognitive Function Ad8 score reviewed for issues:  Issues making decisions:no  Less interest in hobbies / activities:no  Repeats questions, stories (family  complaining):no  Trouble using ordinary gadgets (microwave, computer, phone):no  Forgets the month or year: no  Mismanaging finances: no  Remembering appts:no  Daily problems with thinking and/or memory:no Ad8 score is=0  MMSE - Mini Mental State Exam 11/25/2016  Orientation to time 5  Orientation to Place 5  Registration 3  Attention/ Calculation 5  Recall 3  Language- name 2 objects 2  Language- repeat 1  Language- follow 3 step command 3  Language- read & follow direction 1  Write a sentence 1  Copy design 1  Total score 30        Immunization History  Administered Date(s) Administered  . Influenza, Quadrivalent, Recombinant, Inj, Pf 08/15/2016, 08/05/2017  . Td 02/04/2009    Screening Tests Health Maintenance  Topic Date Due  . MAMMOGRAM  12/11/2017  . PNA vac Low Risk Adult (1 of 2 - PCV13) 05/26/2018 (Originally 02/18/2010)  . DEXA SCAN  11/27/2018  . TETANUS/TDAP  02/05/2019  . COLONOSCOPY  12/26/2026  . Hepatitis C Screening  Completed       Plan:    Follow up with Dr.Paz today as scheduled  Continue to eat heart healthy diet (full of fruits, vegetables, whole grains, lean protein, water--limit salt, fat, and sugar intake) and increase physical activity as tolerated.  Continue doing brain stimulating activities (puzzles, reading, adult coloring books, staying active) to keep memory sharp.   Bring a copy of your living will and/or healthcare power of attorney to your next office visit.  I have personally reviewed and noted the following in the patient's chart:   . Medical and social history . Use of alcohol, tobacco or illicit drugs  . Current medications and supplements . Functional ability and status . Nutritional status . Physical activity . Advanced directives . List of other physicians . Hospitalizations, surgeries, and ER visits in previous 12 months . Vitals . Screenings to include cognitive, depression, and falls . Referrals and  appointments  In addition, I have reviewed and discussed with patient certain preventive protocols, quality metrics, and best  practice recommendations. A written personalized care plan for preventive services as well as general preventive health recommendations were provided to patient.     Mady Haagensen [place], [place]  12/12/2017   Willow Ora, MD

## 2017-12-12 ENCOUNTER — Encounter: Payer: Self-pay | Admitting: *Deleted

## 2017-12-12 ENCOUNTER — Ambulatory Visit (INDEPENDENT_AMBULATORY_CARE_PROVIDER_SITE_OTHER): Payer: Medicare Other | Admitting: *Deleted

## 2017-12-12 ENCOUNTER — Encounter: Payer: Self-pay | Admitting: Internal Medicine

## 2017-12-12 ENCOUNTER — Ambulatory Visit (INDEPENDENT_AMBULATORY_CARE_PROVIDER_SITE_OTHER): Payer: Medicare Other | Admitting: Internal Medicine

## 2017-12-12 ENCOUNTER — Ambulatory Visit: Payer: Self-pay | Admitting: Internal Medicine

## 2017-12-12 VITALS — BP 124/68 | HR 73 | Ht 63.0 in | Wt 129.6 lb

## 2017-12-12 VITALS — BP 124/68 | HR 73 | Ht 63.0 in | Wt 129.0 lb

## 2017-12-12 DIAGNOSIS — F419 Anxiety disorder, unspecified: Secondary | ICD-10-CM

## 2017-12-12 DIAGNOSIS — E7849 Other hyperlipidemia: Secondary | ICD-10-CM

## 2017-12-12 DIAGNOSIS — Z79899 Other long term (current) drug therapy: Secondary | ICD-10-CM

## 2017-12-12 DIAGNOSIS — R945 Abnormal results of liver function studies: Secondary | ICD-10-CM

## 2017-12-12 DIAGNOSIS — Z0000 Encounter for general adult medical examination without abnormal findings: Secondary | ICD-10-CM | POA: Diagnosis not present

## 2017-12-12 DIAGNOSIS — R7989 Other specified abnormal findings of blood chemistry: Secondary | ICD-10-CM

## 2017-12-12 MED ORDER — SPIRONOLACTONE 25 MG PO TABS: 12.5000 mg | ORAL_TABLET | Freq: Every day | ORAL | 2 refills | Status: DC

## 2017-12-12 MED ORDER — FINASTERIDE 5 MG PO TABS: 5.0000 mg | ORAL_TABLET | Freq: Every day | ORAL | 2 refills | Status: DC

## 2017-12-12 MED ORDER — AMLODIPINE BESY-BENAZEPRIL HCL 5-10 MG PO CAPS: 1.0000 | ORAL_CAPSULE | Freq: Every day | ORAL | 2 refills | Status: DC

## 2017-12-12 MED ORDER — SIMVASTATIN 20 MG PO TABS: 20.0000 mg | ORAL_TABLET | Freq: Every day | ORAL | 2 refills | Status: DC

## 2017-12-12 MED ORDER — RALOXIFENE HCL 60 MG PO TABS: 60.0000 mg | ORAL_TABLET | Freq: Every day | ORAL | 2 refills | Status: DC

## 2017-12-12 NOTE — Assessment & Plan Note (Signed)
HTN: On Lotrel, Aldactone 25 mg half tablet daily.  Well controlled, labs. High cholesterol: Simvastatin, checking labs Alopecia: Stopped  minoxidil due to local irritation.  Otherwise taking the other medications Anxiety: Controlled on clonazepam, UDS contract today, on average 1 tablet B.I.D. RF as needed Depression: Saw a counselor, better, no need for further counseling. RTC 1 year

## 2017-12-12 NOTE — Progress Notes (Signed)
Pre visit review using our clinic review tool, if applicable. No additional management support is needed unless otherwise documented below in the visit note. 

## 2017-12-12 NOTE — Patient Instructions (Signed)
Continue to eat heart healthy diet (full of fruits, vegetables, whole grains, lean protein, water--limit salt, fat, and sugar intake) and increase physical activity as tolerated.  Continue doing brain stimulating activities (puzzles, reading, adult coloring books, staying active) to keep memory sharp.   Bring a copy of your living will and/or healthcare power of attorney to your next office visit.   Linda Jefferson , Thank you for taking time to come for your Medicare Wellness Visit. I appreciate your ongoing commitment to your health goals. Please review the following plan we discussed and let me know if I can assist you in the future.   These are the goals we discussed: Goals    . Taking to a small beach trip.        This is a list of the screening recommended for you and due dates:  Health Maintenance  Topic Date Due  . Mammogram  12/11/2017  . Pneumonia vaccines (1 of 2 - PCV13) 05/26/2018*  . DEXA scan (bone density measurement)  11/27/2018  . Tetanus Vaccine  02/05/2019  . Colon Cancer Screening  12/26/2026  .  Hepatitis C: One time screening is recommended by Center for Disease Control  (CDC) for  adults born from [birth year] through 1966.   Completed  *Topic was postponed. The date shown is not the original due date.    Health Maintenance for Postmenopausal Women Menopause is a normal process in which your reproductive ability comes to an end. This process happens gradually over a span of months to years, usually between the ages of 76 and 47. Menopause is complete when you have missed 12 consecutive menstrual periods. It is important to talk with your health care provider about some of the most common conditions that affect postmenopausal women, such as heart disease, cancer, and bone loss (osteoporosis). Adopting a healthy lifestyle and getting preventive care can help to promote your health and wellness. Those actions can also lower your chances of developing some of these common  conditions. What should I know about menopause? During menopause, you may experience a number of symptoms, such as:  Moderate-to-severe hot flashes.  Night sweats.  Decrease in sex drive.  Mood swings.  Headaches.  Tiredness.  Irritability.  Memory problems.  Insomnia.  Choosing to treat or not to treat menopausal changes is an individual decision that you make with your health care provider. What should I know about hormone replacement therapy and supplements? Hormone therapy products are effective for treating symptoms that are associated with menopause, such as hot flashes and night sweats. Hormone replacement carries certain risks, especially as you become older. If you are thinking about using estrogen or estrogen with progestin treatments, discuss the benefits and risks with your health care provider. What should I know about heart disease and stroke? Heart disease, heart attack, and stroke become more likely as you age. This may be due, in part, to the hormonal changes that your body experiences during menopause. These can affect how your body processes dietary fats, triglycerides, and cholesterol. Heart attack and stroke are both medical emergencies. There are many things that you can do to help prevent heart disease and stroke:  Have your blood pressure checked at least every 1-2 years. High blood pressure causes heart disease and increases the risk of stroke.  If you are 82-60 years old, ask your health care provider if you should take aspirin to prevent a heart attack or a stroke.  Do not use any tobacco products, including cigarettes,  chewing tobacco, or electronic cigarettes. If you need help quitting, ask your health care provider.  It is important to eat a healthy diet and maintain a healthy weight. ? Be sure to include plenty of vegetables, fruits, low-fat dairy products, and lean protein. ? Avoid eating foods that are high in solid fats, added sugars, or salt  (sodium).  Get regular exercise. This is one of the most important things that you can do for your health. ? Try to exercise for at least 150 minutes each week. The type of exercise that you do should increase your heart rate and make you sweat. This is known as moderate-intensity exercise. ? Try to do strengthening exercises at least twice each week. Do these in addition to the moderate-intensity exercise.  Know your numbers.Ask your health care provider to check your cholesterol and your blood glucose. Continue to have your blood tested as directed by your health care provider.  What should I know about cancer screening? There are several types of cancer. Take the following steps to reduce your risk and to catch any cancer development as early as possible. Breast Cancer  Practice breast self-awareness. ? This means understanding how your breasts normally appear and feel. ? It also means doing regular breast self-exams. Let your health care provider know about any changes, no matter how small.  If you are 62 or older, have a clinician do a breast exam (clinical breast exam or CBE) every year. Depending on your age, family history, and medical history, it may be recommended that you also have a yearly breast X-ray (mammogram).  If you have a family history of breast cancer, talk with your health care provider about genetic screening.  If you are at high risk for breast cancer, talk with your health care provider about having an MRI and a mammogram every year.  Breast cancer (BRCA) gene test is recommended for women who have family members with BRCA-related cancers. Results of the assessment will determine the need for genetic counseling and BRCA1 and for BRCA2 testing. BRCA-related cancers include these types: ? Breast. This occurs in males or females. ? Ovarian. ? Tubal. This may also be called fallopian tube cancer. ? Cancer of the abdominal or pelvic lining (peritoneal  cancer). ? Prostate. ? Pancreatic.  Cervical, Uterine, and Ovarian Cancer Your health care provider may recommend that you be screened regularly for cancer of the pelvic organs. These include your ovaries, uterus, and vagina. This screening involves a pelvic exam, which includes checking for microscopic changes to the surface of your cervix (Pap test).  For women ages 21-65, health care providers may recommend a pelvic exam and a Pap test every three years. For women ages 19-65, they may recommend the Pap test and pelvic exam, combined with testing for human papilloma virus (HPV), every five years. Some types of HPV increase your risk of cervical cancer. Testing for HPV may also be done on women of any age who have unclear Pap test results.  Other health care providers may not recommend any screening for nonpregnant women who are considered low risk for pelvic cancer and have no symptoms. Ask your health care provider if a screening pelvic exam is right for you.  If you have had past treatment for cervical cancer or a condition that could lead to cancer, you need Pap tests and screening for cancer for at least 20 years after your treatment. If Pap tests have been discontinued for you, your risk factors (such as having  a new sexual partner) need to be reassessed to determine if you should start having screenings again. Some women have medical problems that increase the chance of getting cervical cancer. In these cases, your health care provider may recommend that you have screening and Pap tests more often.  If you have a family history of uterine cancer or ovarian cancer, talk with your health care provider about genetic screening.  If you have vaginal bleeding after reaching menopause, tell your health care provider.  There are currently no reliable tests available to screen for ovarian cancer.  Lung Cancer Lung cancer screening is recommended for adults 37-17 years old who are at high risk for  lung cancer because of a history of smoking. A yearly low-dose CT scan of the lungs is recommended if you:  Currently smoke.  Have a history of at least 30 pack-years of smoking and you currently smoke or have quit within the past 15 years. A pack-year is smoking an average of one pack of cigarettes per day for one year.  Yearly screening should:  Continue until it has been 15 years since you quit.  Stop if you develop a health problem that would prevent you from having lung cancer treatment.  Colorectal Cancer  This type of cancer can be detected and can often be prevented.  Routine colorectal cancer screening usually begins at age 48 and continues through age 35.  If you have risk factors for colon cancer, your health care provider may recommend that you be screened at an earlier age.  If you have a family history of colorectal cancer, talk with your health care provider about genetic screening.  Your health care provider may also recommend using home test kits to check for hidden blood in your stool.  A small camera at the end of a tube can be used to examine your colon directly (sigmoidoscopy or colonoscopy). This is done to check for the earliest forms of colorectal cancer.  Direct examination of the colon should be repeated every 5-10 years until age 62. However, if early forms of precancerous polyps or small growths are found or if you have a family history or genetic risk for colorectal cancer, you may need to be screened more often.  Skin Cancer  Check your skin from head to toe regularly.  Monitor any moles. Be sure to tell your health care provider: ? About any new moles or changes in moles, especially if there is a change in a mole's shape or color. ? If you have a mole that is larger than the size of a pencil eraser.  If any of your family members has a history of skin cancer, especially at a young age, talk with your health care provider about genetic  screening.  Always use sunscreen. Apply sunscreen liberally and repeatedly throughout the day.  Whenever you are outside, protect yourself by wearing long sleeves, pants, a wide-brimmed hat, and sunglasses.  What should I know about osteoporosis? Osteoporosis is a condition in which bone destruction happens more quickly than new bone creation. After menopause, you may be at an increased risk for osteoporosis. To help prevent osteoporosis or the bone fractures that can happen because of osteoporosis, the following is recommended:  If you are 90-62 years old, get at least 1,000 mg of calcium and at least 600 mg of vitamin D per day.  If you are older than age 39 but younger than age 20, get at least 1,200 mg of calcium and at  least 600 mg of vitamin D per day.  If you are older than age 46, get at least 1,200 mg of calcium and at least 800 mg of vitamin D per day.  Smoking and excessive alcohol intake increase the risk of osteoporosis. Eat foods that are rich in calcium and vitamin D, and do weight-bearing exercises several times each week as directed by your health care provider. What should I know about how menopause affects my mental health? Depression may occur at any age, but it is more common as you become older. Common symptoms of depression include:  Low or sad mood.  Changes in sleep patterns.  Changes in appetite or eating patterns.  Feeling an overall lack of motivation or enjoyment of activities that you previously enjoyed.  Frequent crying spells.  Talk with your health care provider if you think that you are experiencing depression. What should I know about immunizations? It is important that you get and maintain your immunizations. These include:  Tetanus, diphtheria, and pertussis (Tdap) booster vaccine.  Influenza every year before the flu season begins.  Pneumonia vaccine.  Shingles vaccine.  Your health care provider may also recommend other  immunizations. This information is not intended to replace advice given to you by your health care provider. Make sure you discuss any questions you have with your health care provider. Document Released: 11/15/2005 Document Revised: 04/12/2016 Document Reviewed: 06/27/2015 Elsevier Interactive Patient Education  2018 [employer].

## 2017-12-12 NOTE — Patient Instructions (Signed)
GO TO THE LAB : Get the blood work     GO TO THE FRONT DESK Schedule your next appointment   for a physical exam in 1 year 

## 2017-12-12 NOTE — Progress Notes (Signed)
Subjective:    Patient ID: Linda Jefferson, female    DOB: [birth year], 72 y.o.   MRN: [id number]  DOS:  12/12/2017 Type of visit - description : cpx Interval history:  no major concerns    Review of Systems She remains active. Feeling very well emotionally, did some counseling and is back to her normal self  Other than above, a 14 point review of systems is negative     Past Medical History:  Diagnosis Date  . Anxiety   . Carotid artery disease (HCC)    per u/s 01/2009- repeated u/s 7/11 "mild dz, no stenosis"  . Colitis   . Dyslipidemia   . Food allergy    mushrooms  . Gallstones   . Hyperlipidemia   . Hypertension   . LBP (low back pain) 3/11   after a local injection, f/u [facility]  . Normal cardiac stress test 04/2009  . Osteopenia   . Seasonal allergies   . 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 Wears hearing aid     Past Surgical History:  Procedure Laterality Date  . BUNIONECTOMY Bilateral 03/2013  . CARPAL TUNNEL RELEASE Right   . CHOLECYSTECTOMY    . TOTAL SHOULDER ARTHROPLASTY Left 04/2016  . TRIGGER FINGER RELEASE  03/31/2012   Procedure: RELEASE TRIGGER FINGER/A-1 PULLEY;  Surgeon: Wyn Forster., MD;  Location: [facility];  Service: Orthopedics;  Laterality: Right;  right index    Social History   Socioeconomic History  . Marital status: Married    Spouse name: Not on file  . Number of children: 3  . Years of education: Not on file  . Highest education level: Not on file  Social Needs  . Financial resource strain: Not on file  . Food insecurity - worry: Not on file  . Food insecurity - inability: Not on file  . Transportation needs - medical: Not on file  . Transportation needs - non-medical: Not on file  Occupational History  . 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 Smoking status: Never Smoker  . Smokeless tobacco: Never Used  Substance and Sexual Activity  .  Alcohol use: Yes    Alcohol/week: 1.2 oz    Types: 2 Glasses of wine per week    Comment: socially wine  . Drug use: No  . Sexual activity: No  Other Topics Concern  . Not on file  Social History Narrative   Household: pt and husband   3 children, one in [place] (PhD)                 Family History  Problem Relation Age of Onset  . Hypertension Father   . Other Father        cerebral hemorrhage  . Coronary artery disease Mother 33  . Stroke Mother   . Diabetes Mother   . Breast cancer Mother   . Hypertension Mother   . Hypertension Sister   . Breast cancer Maternal Aunt        x 2  . Breast cancer Paternal Aunt   . Colon cancer Neg Hx      Allergies as of 12/12/2017      Reactions   Mushroom Extract Complex Shortness Of Breath   Shellfish Allergy Anaphylaxis   Oxycodone Itching, Other (See Comments)   "skin crawling"   Eggs Or Egg-derived Products Other (See Comments)   Only  eats egg whites. Egg yolk causes nausea.   Nyquil Multi-symptom [pseudoeph-doxylamine-dm-apap]    Pseudoeph-doxylamine-dm-apap Other (See Comments)   High blood pressure.   Tape Rash, Other (See Comments)   Patient states use paper tape only. All other tapes cause rash.      Medication List        Accurate as of 12/12/17  5:17 PM. Always use your most recent med list.          amLODipine-benazepril 5-10 MG capsule Commonly known as:  LOTREL Take 1 capsule by mouth daily.   aspirin EC 81 MG tablet Take 81 mg by mouth daily.   cetirizine 10 MG tablet Commonly known as:  ZYRTEC Take 10 mg by mouth daily.   clonazePAM 0.5 MG tablet Commonly known as:  KLONOPIN Take 1 tablet (0.5 mg total) by mouth 3 (three) times daily as needed for anxiety.   DRY EYES OP Place 2 drops into both eyes as needed (for dry eyes).   finasteride 5 MG tablet Commonly known as:  PROSCAR Take 1 tablet (5 mg total) by mouth daily.   fluocinonide 0.05 % external solution Commonly known as:   LIDEX Apply 1 application topically every evening.   fluticasone 50 MCG/ACT nasal spray Commonly known as:  FLONASE Place 2 sprays into both nostrils daily.   multivitamin capsule Take 1 capsule by mouth daily.   PLAQUENIL 200 MG tablet Generic drug:  hydroxychloroquine Take 100 mg by mouth daily.   raloxifene 60 MG tablet Commonly known as:  EVISTA Take 1 tablet (60 mg total) by mouth daily.   simvastatin 20 MG tablet Commonly known as:  ZOCOR Take 1 tablet (20 mg total) by mouth at bedtime.   spironolactone 25 MG tablet Commonly known as:  ALDACTONE Take 0.5 tablets (12.5 mg total) by mouth daily.          Objective:   Physical Exam BP 124/68   Pulse 73   Ht 5\' 3"  (1.6 m)   Wt 129 lb (58.5 kg)   SpO2 96%   BMI 22.85 kg/m  General:   Well developed, well nourished . NAD.  Neck: No  thyromegaly  HEENT:  Normocephalic . Face symmetric, atraumatic Lungs:  CTA B Normal respiratory effort, no intercostal retractions, no accessory muscle use. Heart: RRR,  no murmur.  No pretibial edema bilaterally  Abdomen:  Not distended, soft, non-tender. No rebound or rigidity.  Palpable, nontender aorta Skin: Exposed areas without rash. Not pale. Not jaundice Neurologic:  alert & oriented X3.  Speech normal, gait appropriate for age and unassisted Strength symmetric and appropriate for age.  Psych: Cognition and judgment appear intact.  Cooperative with normal attention span and concentration.  Behavior appropriate. No anxious or depressed appearing.     Assessment & Plan:   Assessment HTN Hyperlipidemia Osteopenia  Per dexa 2011,  2014 (Tt score  - 1.7), and 11/2016. On Evista d/t FH breast ca Anxiety-- on clonazepam prn Seasonal (and food?) allergies Alopecia Female pattern, lichen planopilaris; saw derm 04-2017   HOH, hearing aids H/o Syncope, 2010:   --Nl  stress test, Holter: Atrial tachycardia --Carotid ultrasound 2010,2011 , 2014 no significant  stenosis +FH CAD, mother age 73 + FH Breast ca- mother, sister x2, aunt --- on Evista  H/o blood in stools, Cscope 2009, (-) except for hemorrhoids  admitted colitis 10-2016  PLAN: HTN: On Lotrel, Aldactone 25 mg half tablet daily.  Well controlled, labs. High cholesterol: Simvastatin, checking labs Alopecia: Stopped  minoxidil due to local irritation.  Otherwise taking the other medications Anxiety: Controlled on clonazepam, UDS contract today, on average 1 tablet B.I.D. RF as needed Depression: Saw a counselor, better, no need for further counseling. RTC 1 year

## 2017-12-12 NOTE — Assessment & Plan Note (Signed)
-  Td 5-10; had a Flu shot; pneumonia -Shingles immunization-- declined all -See previous entries, no further PAPs unless pt so desires  - Strong FH breast ca, pt reports previous  genetic testing ; MMG 2018 neg, next MMG: next week; breast self awareness dicussed -Colonoscopy:  04/18/2008, hemorrhoids. Cscope 12-2016 (done for colitis): no plyps -Palpable Ao: US neg for AAA 6-10 -diet-exercise discussed.  She is doing well -Labs: CMP, FLP, CBC

## 2017-12-13 LAB — CBC WITH DIFFERENTIAL/PLATELET
Basophils Absolute: 32 cells/uL (ref 0–200)
Basophils Relative: 0.5 %
Eosinophils Absolute: 13 cells/uL — ABNORMAL LOW (ref 15–500)
Eosinophils Relative: 0.2 %
HCT: 36 % (ref 35.0–45.0)
Hemoglobin: 12.3 g/dL (ref 11.7–15.5)
Lymphs Abs: 1298 cells/uL (ref 850–3900)
MCH: 30.7 pg (ref 27.0–33.0)
MCHC: 34.2 g/dL (ref 32.0–36.0)
MCV: 89.8 fL (ref 80.0–100.0)
MPV: 10.3 fL (ref 7.5–12.5)
Monocytes Relative: 7.7 %
Neutro Abs: 4473 cells/uL (ref 1500–7800)
Neutrophils Relative %: 71 %
Platelets: 381 10*3/uL (ref 140–400)
RBC: 4.01 10*6/uL (ref 3.80–5.10)
RDW: 12.3 % (ref 11.0–15.0)
Total Lymphocyte: 20.6 %
WBC mixed population: 485 cells/uL (ref 200–950)
WBC: 6.3 10*3/uL (ref 3.8–10.8)

## 2017-12-13 LAB — COMPREHENSIVE METABOLIC PANEL
AG Ratio: 1.8 (calc) (ref 1.0–2.5)
ALT: 35 U/L — ABNORMAL HIGH (ref 6–29)
AST: 41 U/L — ABNORMAL HIGH (ref 10–35)
Albumin: 4.5 g/dL (ref 3.6–5.1)
Alkaline phosphatase (APISO): 56 U/L (ref 33–130)
BUN/Creatinine Ratio: 23 (calc) — ABNORMAL HIGH (ref 6–22)
BUN: 22 mg/dL (ref 7–25)
CO2: 26 mmol/L (ref 20–32)
Calcium: 10.3 mg/dL (ref 8.6–10.4)
Chloride: 98 mmol/L (ref 98–110)
Creat: 0.97 mg/dL — ABNORMAL HIGH (ref 0.60–0.93)
Globulin: 2.5 g/dL (calc) (ref 1.9–3.7)
Glucose, Bld: 91 mg/dL (ref 65–99)
Potassium: 4.8 mmol/L (ref 3.5–5.3)
Sodium: 134 mmol/L — ABNORMAL LOW (ref 135–146)
Total Bilirubin: 0.4 mg/dL (ref 0.2–1.2)
Total Protein: 7 g/dL (ref 6.1–8.1)

## 2017-12-13 LAB — LIPID PANEL
Cholesterol: 146 mg/dL (ref <200)
HDL: 68 mg/dL (ref >50)
LDL Cholesterol (Calc): 63 mg/dL (calc)
Non-HDL Cholesterol (Calc): 78 mg/dL (calc) (ref <130)
Total CHOL/HDL Ratio: 2.1 (calc) (ref <5.0)
Triglycerides: 66 mg/dL (ref <150)

## 2017-12-16 NOTE — Addendum Note (Signed)
Addended byConrad [place]: Hanford Lust D on: 12/16/2017 03:38 PM   Modules accepted: Orders

## 2017-12-17 LAB — PAIN MGMT, PROFILE 8 W/CONF, U
6 Acetylmorphine: NEGATIVE ng/mL (ref <10)
Alcohol Metabolites: POSITIVE ng/mL — AB (ref <500)
Alphahydroxyalprazolam: NEGATIVE ng/mL (ref <25)
Alphahydroxymidazolam: NEGATIVE ng/mL (ref <50)
Alphahydroxytriazolam: NEGATIVE ng/mL (ref <50)
Aminoclonazepam: >1000 ng/mL — ABNORMAL HIGH (ref <25)
Amphetamines: NEGATIVE ng/mL (ref <500)
Benzodiazepines: POSITIVE ng/mL — AB (ref <100)
Buprenorphine, Urine: NEGATIVE ng/mL (ref <5)
Buprenorphine: NEGATIVE ng/mL (ref <2)
Cocaine Metabolite: NEGATIVE ng/mL (ref <150)
Creatinine: 133.4 mg/dL
Ethyl Glucuronide (ETG): 2027 ng/mL — ABNORMAL HIGH (ref <500)
Ethyl Sulfate (ETS): 557 ng/mL — ABNORMAL HIGH (ref <100)
Hydroxyethylflurazepam: NEGATIVE ng/mL (ref <50)
Lorazepam: NEGATIVE ng/mL (ref <50)
MDMA: NEGATIVE ng/mL (ref <500)
Marijuana Metabolite: NEGATIVE ng/mL (ref <20)
Norbuprenorphine: NEGATIVE ng/mL (ref <2)
Nordiazepam: NEGATIVE ng/mL (ref <50)
Opiates: NEGATIVE ng/mL (ref <100)
Oxazepam: NEGATIVE ng/mL (ref <50)
Oxidant: NEGATIVE ug/mL (ref <200)
Oxycodone: NEGATIVE ng/mL (ref <100)
Temazepam: NEGATIVE ng/mL (ref <50)
pH: 6.25 (ref 4.5–9.0)

## 2017-12-20 ENCOUNTER — Encounter: Payer: Self-pay | Admitting: Internal Medicine

## 2017-12-28 ENCOUNTER — Encounter: Payer: Self-pay | Admitting: Internal Medicine

## 2017-12-29 ENCOUNTER — Telehealth: Payer: Self-pay | Admitting: Internal Medicine

## 2017-12-29 ENCOUNTER — Ambulatory Visit: Payer: Medicare Other | Admitting: Internal Medicine

## 2017-12-29 ENCOUNTER — Encounter: Payer: Self-pay | Admitting: Internal Medicine

## 2017-12-29 VITALS — BP 128/74 | HR 73 | Temp 98.3°F | Resp 14 | Ht 63.0 in | Wt 129.1 lb

## 2017-12-29 DIAGNOSIS — B349 Viral infection, unspecified: Secondary | ICD-10-CM | POA: Diagnosis not present

## 2017-12-29 NOTE — Telephone Encounter (Signed)
Noted  

## 2017-12-29 NOTE — Telephone Encounter (Signed)
Pt has an appt scheduled this afternoon

## 2017-12-29 NOTE — Telephone Encounter (Signed)
Copied from CRM [phone]. Topic: Quick Communication - See Telephone Encounter >> Dec 29, 2017  7:41 AM Oneal GroutSebastian, Jennifer S wrote: CRM for notification. See Telephone encounter for: 12/29/17. Requesting provider to review mychart message, patient is scheduled for appt today at 3:20

## 2017-12-29 NOTE — Progress Notes (Signed)
Pre visit review using our clinic review tool, if applicable. No additional management support is needed unless otherwise documented below in the visit note. 

## 2017-12-29 NOTE — Patient Instructions (Addendum)
SKIP  the next dose of Lotrel ( amlodipine benazepril) and spironolactone  Drink plenty of fluids such as water, ginger ale, soup.  Robitussin-DM or Mucinex DM as needed  Rest   Call if not improving in the next 2-3 days.

## 2017-12-29 NOTE — Progress Notes (Signed)
Subjective:    Patient ID: Linda Jefferson, female    DOB: October 08, 1945, 72 y.o.   MRN: [id number]  DOS:  12/29/2017  Type of visit - description : acute Interval history: Sx started 5 days ago with runny nose, raspy throat, congestion, nausea and diarrhea.  She also had cough. Symptoms lasted 3 days and then they started to get better. She is here because she remains extremely tired and gets dizzy mostly when she stands up. See patient's message. BP was elevated x1.   Review of Systems Her appetite and p.o. intake has been low but she is drinking plenty of fluids. No fever chills No chest pain, difficulty breathing, abdominal pain.  Past Medical History:  Diagnosis Date  . Anxiety   . Carotid artery disease (HCC)    per u/s 01/2009- repeated u/s 7/11 "mild dz, no stenosis"  . Colitis   . Dyslipidemia   . Food allergy    mushrooms  . Gallstones   . Hyperlipidemia   . Hypertension   . LBP (low back pain) 3/11   after a local injection, f/u [facility]  . Normal cardiac stress test 04/2009  . Osteopenia   . Seasonal allergies   . 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 Wears hearing aid     Past Surgical History:  Procedure Laterality Date  . BUNIONECTOMY Bilateral 03/2013  . CARPAL TUNNEL RELEASE Right   . CHOLECYSTECTOMY    . TOTAL SHOULDER ARTHROPLASTY Left 04/2016  . TRIGGER FINGER RELEASE  03/31/2012   Procedure: RELEASE TRIGGER FINGER/A-1 PULLEY;  Surgeon: Wyn Forster., MD;  Location: [facility];  Service: Orthopedics;  Laterality: Right;  right index    Social History   Socioeconomic History  . Marital status: Married    Spouse name: Not on file  . Number of children: 3  . Years of education: Not on file  . Highest education level: Not on file  Occupational History  . 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 Financial resource strain: Not on file  . Food insecurity:    Worry:  Not on file    Inability: Not on file  . Transportation needs:    Medical: Not on file    Non-medical: Not on file  Tobacco Use  . Smoking status: Never Smoker  . Smokeless tobacco: Never Used  Substance and Sexual Activity  . Alcohol use: Yes    Alcohol/week: 1.2 oz    Types: 2 Glasses of wine per week    Comment: socially wine  . Drug use: No  . Sexual activity: Never  Lifestyle  . Physical activity:    Days per week: Not on file    Minutes per session: Not on file  . Stress: Not on file  Relationships  . Social connections:    Talks on phone: Not on file    Gets together: Not on file    Attends religious service: Not on file    Active member of club or organization: Not on file    Attends meetings of clubs or organizations: Not on file    Relationship status: Not on file  . Intimate partner violence:    Fear of current or ex partner: Not on file    Emotionally abused: Not on file    Physically abused: Not on file    Forced sexual activity: Not on file  Other Topics Concern  .  Not on file  Social History Narrative   Household: pt and husband   3 children, one in [place] (PhD)                  Allergies as of 12/29/2017      Reactions   Mushroom Extract Complex Shortness Of Breath   Shellfish Allergy Anaphylaxis   Oxycodone Itching, Other (See Comments)   "skin crawling"   Eggs Or Egg-derived Products Other (See Comments)   Only eats egg whites. Egg yolk causes nausea.   Nyquil Multi-symptom [pseudoeph-doxylamine-dm-apap]    Pseudoeph-doxylamine-dm-apap Other (See Comments)   High blood pressure.   Tape Rash, Other (See Comments)   Patient states use paper tape only. All other tapes cause rash.      Medication List        Accurate as of 12/29/17 11:59 PM. Always use your most recent med list.          amLODipine-benazepril 5-10 MG capsule Commonly known as:  LOTREL Take 1 capsule by mouth daily.   aspirin EC 81 MG tablet Take 81 mg by mouth  daily.   cetirizine 10 MG tablet Commonly known as:  ZYRTEC Take 10 mg by mouth daily.   clonazePAM 0.5 MG tablet Commonly known as:  KLONOPIN Take 1 tablet (0.5 mg total) by mouth 3 (three) times daily as needed for anxiety.   DRY EYES OP Place 2 drops into both eyes as needed (for dry eyes).   finasteride 5 MG tablet Commonly known as:  PROSCAR Take 1 tablet (5 mg total) by mouth daily.   fluocinonide 0.05 % external solution Commonly known as:  LIDEX Apply 1 application topically every evening.   fluticasone 50 MCG/ACT nasal spray Commonly known as:  FLONASE Place 2 sprays into both nostrils daily.   multivitamin capsule Take 1 capsule by mouth daily.   PLAQUENIL 200 MG tablet Generic drug:  hydroxychloroquine Take 100 mg by mouth daily.   raloxifene 60 MG tablet Commonly known as:  EVISTA Take 1 tablet (60 mg total) by mouth daily.   simvastatin 20 MG tablet Commonly known as:  ZOCOR Take 1 tablet (20 mg total) by mouth at bedtime.   spironolactone 25 MG tablet Commonly known as:  ALDACTONE Take 0.5 tablets (12.5 mg total) by mouth daily.          Objective:   Physical Exam BP 128/74 (BP Location: Left Arm, Patient Position: Sitting, Cuff Size: Small)   Pulse 73   Temp 98.3 F (36.8 C) (Oral)   Resp 14   Ht 5\' 3"  (1.6 m)   Wt 129 lb 2 oz (58.6 kg)   SpO2 98%   BMI 22.87 kg/m  General:   Well developed, well nourished . NAD.  HEENT:  Normocephalic . Face symmetric, atraumatic  TMs normal, throat symmetric, membranes are slightly dry.  No discharge. Lungs:  CTA B Normal respiratory effort, no intercostal retractions, no accessory muscle use. Heart: RRR,  no murmur.  no pretibial edema bilaterally  Abdomen:  Not distended, soft, non-tender. No rebound or rigidity.   Skin: Not pale. Not jaundice Neurologic:  alert & oriented X3.  Speech normal, gait appropriate for age and unassisted Motor symmetric, EOMI; DTRs absent at the knee and ankles  otherwise symmetric (absent DTRs in the lower extremities not new per patient) Psych--  Cognition and judgment appear intact.  Cooperative with normal attention span and concentration.  Behavior appropriate. No anxious or depressed appearing.  Assessment & Plan:    Assessment HTN Hyperlipidemia Osteopenia  Per dexa 2011,  2014 (Tt score  - 1.7), and 11/2016. On Evista d/t FH breast ca Anxiety-- on clonazepam prn Seasonal (and food?) allergies Alopecia Female pattern, lichen planopilaris; saw derm 04-2017   HOH, hearing aids H/o Syncope, 2010:   --Nl  stress test, Holter: Atrial tachycardia --Carotid ultrasound 2010,2011 , 2014 no significant stenosis +FH CAD, mother age 52 + FH Breast ca- mother, sister x2, aunt --- on Evista  H/o blood in stools, Cscope 2009, (-) except for hemorrhoids  admitted colitis 10-2016  PLAN: Viral syndrome: Developed respiratory and GI symptoms 5 days ago, overall is better but she still  feels tired  and dizzy when she stands up.  Oral membranes are dry, orthostatic vital signs however are negative. No evidence of pneumonia or other complications from her virus. Plan: Increase hydration, Mucinex DM, hold spironolactone and Lotrel for 1 dose. If not better in 2-3 days let me know.  (BMP, CBC?)

## 2017-12-30 NOTE — Assessment & Plan Note (Signed)
Viral syndrome: Developed respiratory and GI symptoms 5 days ago, overall is better but she still  feels tired  and dizzy when she stands up.  Oral membranes are dry, orthostatic vital signs however are negative. No evidence of pneumonia or other complications from her virus. Plan: Increase hydration, Mucinex DM, hold spironolactone and Lotrel for 1 dose. If not better in 2-3 days let me know.  (BMP, CBC?)

## 2018-01-08 ENCOUNTER — Ambulatory Visit (HOSPITAL_BASED_OUTPATIENT_CLINIC_OR_DEPARTMENT_OTHER)
Admission: RE | Admit: 2018-01-08 | Discharge: 2018-01-08 | Disposition: A | Discharge status: Home / Self Care | Payer: Medicare Other | Source: Ambulatory Visit | Attending: Internal Medicine | Admitting: Internal Medicine

## 2018-01-08 ENCOUNTER — Ambulatory Visit: Payer: Medicare Other | Admitting: Internal Medicine

## 2018-01-08 ENCOUNTER — Encounter: Payer: Self-pay | Admitting: Internal Medicine

## 2018-01-08 VITALS — BP 124/70 | HR 74 | Temp 97.5°F | Resp 16 | Ht 63.0 in | Wt 131.2 lb

## 2018-01-08 DIAGNOSIS — G8929 Other chronic pain: Secondary | ICD-10-CM | POA: Diagnosis not present

## 2018-01-08 DIAGNOSIS — F419 Anxiety disorder, unspecified: Secondary | ICD-10-CM

## 2018-01-08 DIAGNOSIS — R059 Cough, unspecified: Secondary | ICD-10-CM

## 2018-01-08 DIAGNOSIS — M545 Low back pain, unspecified: Secondary | ICD-10-CM

## 2018-01-08 DIAGNOSIS — R05 Cough: Secondary | ICD-10-CM | POA: Diagnosis not present

## 2018-01-08 MED ORDER — BENZONATATE 200 MG PO CAPS: 200.0000 mg | ORAL_CAPSULE | Freq: Two times a day (BID) | ORAL | 0 refills | Status: DC | PRN

## 2018-01-08 MED ORDER — AZELASTINE HCL 0.1 % NA SOLN: 2.0000 | Freq: Two times a day (BID) | NASAL | 6 refills | Status: DC | PRN

## 2018-01-08 MED ORDER — TRAMADOL HCL 50 MG PO TABS: 50.0000 mg | ORAL_TABLET | Freq: Three times a day (TID) | ORAL | 0 refills | Status: DC | PRN

## 2018-01-08 MED ORDER — METHOCARBAMOL 750 MG PO TABS: 750.0000 mg | ORAL_TABLET | Freq: Two times a day (BID) | ORAL | 0 refills | Status: DC | PRN

## 2018-01-08 NOTE — Patient Instructions (Signed)
Please get a chest x-ray  For cough: Continue Flonase Start Astelin Tessalon if the cough continue  Call if no gradually better  For back pain: Rest, heating pad. Take Ultram as needed, watch for somnolence

## 2018-01-08 NOTE — Progress Notes (Signed)
Pre visit review using our clinic review tool, if applicable. No additional management support is needed unless otherwise documented below in the visit note. 

## 2018-01-08 NOTE — Progress Notes (Signed)
Subjective:    Patient ID: Linda Jefferson, female    DOB: 1946-07-15, 72 y.o.   MRN: [id number]  DOS:  01/08/2018 Type of visit - description : Acute, multiple symptoms Interval history:  + Stress, husband to  have surgery, he is  High surgical risk.  Very worried about him.  She is also doing all the driving and that has affecting her back.  Had a virus a couple of weeks ago, symptoms are now better except for a lingering cough. Denies sinus pain and congestion, not sure if she has PND. No wheezing or shortness of breath  Chronic back pain, bilateral, low.  It is constant, worse if she sits for prolonged periods of time, ambulate, bends or stoops. OTCs ibuprofen, Advil did not help much. No radiation although sometimes she feels a slightly numb on the left anterior thigh.   Review of Systems See above   Past Medical History:  Diagnosis Date  . Anxiety   . Carotid artery disease (HCC)    per u/s 01/2009- repeated u/s 7/11 "mild dz, no stenosis"  . Colitis   . Dyslipidemia   . Food allergy    mushrooms  . Gallstones   . Hyperlipidemia   . Hypertension   . LBP (low back pain) 3/11   after a local injection, [facility]  . Normal cardiac stress test 04/2009  . Osteopenia   . Seasonal allergies   . 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 Wears hearing aid     Past Surgical History:  Procedure Laterality Date  . BUNIONECTOMY Bilateral 03/2013  . CARPAL TUNNEL RELEASE Right   . CHOLECYSTECTOMY    . TOTAL SHOULDER ARTHROPLASTY Left 04/2016  . TRIGGER FINGER RELEASE  03/31/2012   Procedure: RELEASE TRIGGER FINGER/A-1 PULLEY;  Surgeon: Wyn Forsterobert V Sypher Jr., MD;  Location: [facility];  Service: Orthopedics;  Laterality: Right;  right index    Social History   Socioeconomic History  . Marital status: Married    Spouse name: Not on file  . Number of children: 3  . Years of education: Not on file  . Highest education level: Not on file    Occupational History  . Occupation: TEACHER-- retired     Associate Professormployer: [employer]  Social Needs  . Financial resource strain: Not on file  . Food insecurity:    Worry: Not on file    Inability: Not on file  . Transportation needs:    Medical: Not on file    Non-medical: Not on file  Tobacco Use  . Smoking status: Never Smoker  . Smokeless tobacco: Never Used  Substance and Sexual Activity  . Alcohol use: Yes    Alcohol/week: 1.2 oz    Types: 2 Glasses of wine per week    Comment: socially wine  . Drug use: No  . Sexual activity: Never  Lifestyle  . Physical activity:    Days per week: Not on file    Minutes per session: Not on file  . Stress: Not on file  Relationships  . Social connections:    Talks on phone: Not on file    Gets together: Not on file    Attends religious service: Not on file    Active member of club or organization: Not on file    Attends meetings of clubs or organizations: Not on file    Relationship status: Not on file  . Intimate partner violence:  Fear of current or ex partner: Not on file    Emotionally abused: Not on file    Physically abused: Not on file    Forced sexual activity: Not on file  Other Topics Concern  . Not on file  Social History Narrative   Household: pt and husband   3 children, one in [place] (PhD)                  Allergies as of 01/08/2018      Reactions   Mushroom Extract Complex Shortness Of Breath   Shellfish Allergy Anaphylaxis   Oxycodone Itching, Other (See Comments)   "skin crawling"   Eggs Or Egg-derived Products Other (See Comments)   Only eats egg whites. Egg yolk causes nausea.   Nyquil Multi-symptom [pseudoeph-doxylamine-dm-apap]    Pseudoeph-doxylamine-dm-apap Other (See Comments)   High blood pressure.   Tape Rash, Other (See Comments)   Patient states use paper tape only. All other tapes cause rash.      Medication List        Accurate as of 01/08/18 11:59 PM. Always use  your most recent med list.          amLODipine-benazepril 5-10 MG capsule Commonly known as:  LOTREL Take 1 capsule by mouth daily.   aspirin EC 81 MG tablet Take 81 mg by mouth daily.   azelastine 0.1 % nasal spray Commonly known as:  ASTELIN Place 2 sprays into both nostrils 2 (two) times daily as needed for rhinitis or allergies. Use in each nostril as directed   benzonatate 200 MG capsule Commonly known as:  TESSALON Take 1 capsule (200 mg total) by mouth 2 (two) times daily as needed for cough.   cetirizine 10 MG tablet Commonly known as:  ZYRTEC Take 10 mg by mouth daily.   clonazePAM 0.5 MG tablet Commonly known as:  KLONOPIN Take 1 tablet (0.5 mg total) by mouth 3 (three) times daily as needed for anxiety.   DRY EYES OP Place 2 drops into both eyes as needed (for dry eyes).   finasteride 5 MG tablet Commonly known as:  PROSCAR Take 1 tablet (5 mg total) by mouth daily.   fluocinonide 0.05 % external solution Commonly known as:  LIDEX Apply 1 application topically every evening.   fluticasone 50 MCG/ACT nasal spray Commonly known as:  FLONASE Place 2 sprays into both nostrils daily.   methocarbamol 750 MG tablet Commonly known as:  ROBAXIN Take 1 tablet (750 mg total) by mouth 2 (two) times daily as needed for muscle spasms.   multivitamin capsule Take 1 capsule by mouth daily.   PLAQUENIL 200 MG tablet Generic drug:  hydroxychloroquine Take 100 mg by mouth daily.   raloxifene 60 MG tablet Commonly known as:  EVISTA Take 1 tablet (60 mg total) by mouth daily.   simvastatin 20 MG tablet Commonly known as:  ZOCOR Take 1 tablet (20 mg total) by mouth at bedtime.   spironolactone 25 MG tablet Commonly known as:  ALDACTONE Take 0.5 tablets (12.5 mg total) by mouth daily.   traMADol 50 MG tablet Commonly known as:  ULTRAM Take 1 tablet (50 mg total) by mouth 3 (three) times daily as needed for moderate pain.          Objective:   Physical  Exam BP 124/70 (BP Location: Left Arm, Patient Position: Sitting, Cuff Size: Small)   Pulse 74   Temp (!) 97.5 F (36.4 C) (Oral)   Resp 16  Ht 5\' 3"  (1.6 m)   Wt 131 lb 4 oz (59.5 kg)   SpO2 96%   BMI 23.25 kg/m  General:   Well developed, well nourished . NAD.  HEENT:  Normocephalic . Face symmetric, atraumatic. TMs: Slightly bulged.  Nose congested.  Throat symmetric Lungs:  CTA B Normal respiratory effort, no intercostal retractions, no accessory muscle use. Heart: RRR,  no murmur.  No pretibial edema bilaterally  Skin: Not pale. Not jaundice Neurologic:  alert & oriented X3.  Speech normal, gait and transferring somewhat antalgic. DTRs: Absent at the lower extremities, not a new finding per patient.  Low back, minimal TTP throughout the lower area. Psych--  Cognition and judgment appear intact.  Cooperative with normal attention span and concentration.  Behavior appropriate. Tearful    Assessment & Plan:   Assessment HTN Hyperlipidemia Osteopenia  Per dexa 2011,  2014 (Tt score  - 1.7), and 11/2016. On Evista d/t FH breast ca Anxiety-- on clonazepam prn Seasonal (and food?) allergies Alopecia Female pattern, lichen planopilaris; saw derm 04-2017   HOH, hearing aids MSK: --Back DJD and spinal stenosis per MRI 06-2017 --s/p Shoulder replacement H/o Syncope, 2010:   --Nl  stress test, Holter: Atrial tachycardia --Carotid ultrasound 2010,2011 , 2014 no significant stenosis +FH CAD, mother age 39 + FH Breast ca- mother, sister x2, aunt --- on Evista  H/o blood in stools, Cscope 2009, (-) except for hemorrhoids  admitted colitis 10-2016  PLAN: Cough: Lingering cough after a virus, for completeness we will get a chest x-ray otherwise continue Flonase, add Astelin, Tessalon.  If not better in the next few days, will consider switch Lotensin to some other agent. Anxiety: Counseled. Back pain: Chronic issue, MRI 06-2017+ (rx by sports med) showed  DJD and spinal  stenosis. Declined a prednisone round.   Previously Ultram helped to some extent, RX provided.  We will try Robaxin, twice a day. Warned about excessive somnolence with pain meds. Encouraged to call me when ready to see the specialist for back pain.  Unable to do it at this point because her husband is undergoing surgery.

## 2018-01-09 ENCOUNTER — Other Ambulatory Visit: Payer: Self-pay

## 2018-01-09 ENCOUNTER — Emergency Department (HOSPITAL_BASED_OUTPATIENT_CLINIC_OR_DEPARTMENT_OTHER): Payer: Medicare Other

## 2018-01-09 ENCOUNTER — Emergency Department (HOSPITAL_BASED_OUTPATIENT_CLINIC_OR_DEPARTMENT_OTHER)
Admission: EM | Admit: 2018-01-09 | Discharge: 2018-01-09 | Disposition: A | Discharge status: Home / Self Care | Payer: Medicare Other | Attending: Emergency Medicine | Admitting: Emergency Medicine

## 2018-01-09 ENCOUNTER — Encounter (HOSPITAL_BASED_OUTPATIENT_CLINIC_OR_DEPARTMENT_OTHER): Payer: Self-pay | Admitting: *Deleted

## 2018-01-09 DIAGNOSIS — I10 Essential (primary) hypertension: Secondary | ICD-10-CM | POA: Insufficient documentation

## 2018-01-09 DIAGNOSIS — M5416 Radiculopathy, lumbar region: Secondary | ICD-10-CM

## 2018-01-09 DIAGNOSIS — M25551 Pain in right hip: Secondary | ICD-10-CM | POA: Diagnosis present

## 2018-01-09 DIAGNOSIS — Z79899 Other long term (current) drug therapy: Secondary | ICD-10-CM | POA: Diagnosis not present

## 2018-01-09 DIAGNOSIS — Z96612 Presence of left artificial shoulder joint: Secondary | ICD-10-CM | POA: Insufficient documentation

## 2018-01-09 DIAGNOSIS — Z7982 Long term (current) use of aspirin: Secondary | ICD-10-CM | POA: Insufficient documentation

## 2018-01-09 DIAGNOSIS — F419 Anxiety disorder, unspecified: Secondary | ICD-10-CM | POA: Insufficient documentation

## 2018-01-09 DIAGNOSIS — Z9049 Acquired absence of other specified parts of digestive tract: Secondary | ICD-10-CM | POA: Diagnosis not present

## 2018-01-09 DIAGNOSIS — M5431 Sciatica, right side: Secondary | ICD-10-CM | POA: Insufficient documentation

## 2018-01-09 DIAGNOSIS — I2510 Atherosclerotic heart disease of native coronary artery without angina pectoris: Secondary | ICD-10-CM | POA: Insufficient documentation

## 2018-01-09 DIAGNOSIS — M549 Dorsalgia, unspecified: Secondary | ICD-10-CM | POA: Insufficient documentation

## 2018-01-09 MED ORDER — TRAMADOL HCL 50 MG PO TABS: 50.0000 mg | ORAL_TABLET | Freq: Four times a day (QID) | ORAL | 0 refills | Status: DC | PRN

## 2018-01-09 MED ORDER — METHOCARBAMOL 750 MG PO TABS: 750.0000 mg | ORAL_TABLET | Freq: Two times a day (BID) | ORAL | 0 refills | Status: DC | PRN

## 2018-01-09 NOTE — Discharge Instructions (Signed)
Please read and follow all provided instructions.  Your diagnoses today include:  1. Sciatica of right side     Tests performed today include:  Vital signs - see below for your results today  X-ray of your back - shows arthritis, no compression fractures  Medications prescribed:   Tramadol - narcotic-like pain medication  DO NOT drive or perform any activities that require you to be awake and alert because this medicine can make you drowsy.    Robaxin (methocarbamol) - muscle relaxer medication  DO NOT drive or perform any activities that require you to be awake and alert because this medicine can make you drowsy.   Take any prescribed medications only as directed.  Home care instructions:   Follow any educational materials contained in this packet  Please rest, use ice or heat on your back for the next several days  Do not lift, push, pull anything more than 10 pounds for the next week  Follow-up instructions: Please follow-up with your primary care provider in the next 1 week for further evaluation of your symptoms.   Return instructions:  SEEK IMMEDIATE MEDICAL ATTENTION IF YOU HAVE:  New numbness, tingling, weakness, or problem with the use of your arms or legs  Severe back pain not relieved with medications  Loss control of your bowels or bladder  Increasing pain in any areas of the body (such as chest or abdominal pain)  Shortness of breath, dizziness, or fainting.   Worsening nausea (feeling sick to your stomach), vomiting, fever, or sweats  Any other emergent concerns regarding your health   Additional Information:  Your vital signs today were: BP 119/74 (BP Location: Left Arm)    Pulse 88    Temp 98.8 F (37.1 C) (Oral)    Resp 18    Ht 5\' 3"  (1.6 m)    Wt 59.4 kg (131 lb)    SpO2 98%    BMI 23.21 kg/m  If your blood pressure (BP) was elevated above 135/85 this visit, please have this repeated by your doctor within one month. --------------

## 2018-01-09 NOTE — ED Provider Notes (Signed)
[facility] EMERGENCY DEPARTMENT Provider Note   CSN: [id number] Arrival date & time: 01/09/18  1018     History   Chief Complaint Chief Complaint  Patient presents with  . Back Pain    HPI Linda Jefferson is a 72 y.o. female.  Patient with history of osteopenia, degenerative disc disease in her back, spinal stenosis demonstrated on MRI in 06/2017 --presents with complaint of right-sided pain in her hip shooting down her leg to behind her knee.  Symptoms started acutely yesterday while sitting.  She did see her primary care doctor earlier in the day for chronic lower back pain across her lower back, for which she was prescribed Robaxin, tramadol, continued use of ice and heat.  She has been taking this medication.  She reports recent bronchitis and cough which has made her lower back hurt more.  She denies any weakness in her legs or difficulty with ambulation. Patient denies warning symptoms of back pain including: fecal incontinence, urinary retention or overflow incontinence, night sweats, waking from sleep with back pain, unexplained fevers or weight loss, h/o cancer, recent trauma.        Past Medical History:  Diagnosis Date  . Anxiety   . Carotid artery disease (HCC)    per u/s 01/2009- repeated u/s 7/11 "mild dz, no stenosis"  . Colitis   . Dyslipidemia   . Food allergy    mushrooms  . Gallstones   . Hyperlipidemia   . Hypertension   . LBP (low back pain) 3/11   after a local injection, f/u [facility]  . Normal cardiac stress test 04/2009  . Osteopenia   . Seasonal allergies   . 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 Wears hearing aid     Patient Active Problem List   Diagnosis Date Noted  . Lactic acidemia   . PCP NOTES >>>>> 08/12/2015  . Food allergy 08/02/2015  . Other seasonal allergic rhinitis 08/02/2015  . Allergic cough 02/14/2015  . Alopecia 08/03/2014  . Anxiety 07/23/2013  . Annual physical exam 08/07/2011  . Osteopenia  10/22/2010  . Hyperlipemia 02/18/2009  . Carotid artery disease (HCC) 02/17/2009  . HTN (hypertension) 05/20/2008    Past Surgical History:  Procedure Laterality Date  . BUNIONECTOMY Bilateral 03/2013  . CARPAL TUNNEL RELEASE Right   . CHOLECYSTECTOMY    . TOTAL SHOULDER ARTHROPLASTY Left 04/2016  . TRIGGER FINGER RELEASE  03/31/2012   Procedure: RELEASE TRIGGER FINGER/A-1 PULLEY;  Surgeon: Wyn Forster., MD;  Location: [facility];  Service: Orthopedics;  Laterality: Right;  right index     OB History    Gravida  0   Para  0   Term  0   Preterm  0   AB  0   Living        SAB  0   TAB  0   Ectopic  0   Multiple      Live Births               Home Medications    Prior to Admission medications   Medication Sig Start Date End Date Taking? Authorizing Provider  amLODipine-benazepril (LOTREL) 5-10 MG capsule Take 1 capsule by mouth daily. 12/12/17   Wanda Plump, MD  Artificial Tear Ointment (DRY EYES OP) Place 2 drops into both eyes as needed (for dry eyes).    [provider]  aspirin EC 81 MG tablet Take 81 mg  by mouth daily.    [provider]  azelastine (ASTELIN) 0.1 % nasal spray Place 2 sprays into both nostrils 2 (two) times daily as needed for rhinitis or allergies. Use in each nostril as directed 01/08/18   Wanda PlumpPaz, Jose E, MD  benzonatate (TESSALON) 200 MG capsule Take 1 capsule (200 mg total) by mouth 2 (two) times daily as needed for cough. 01/08/18   Wanda PlumpPaz, Jose E, MD  cetirizine (ZYRTEC) 10 MG tablet Take 10 mg by mouth daily.    [provider]  clonazePAM (KLONOPIN) 0.5 MG tablet Take 1 tablet (0.5 mg total) by mouth 3 (three) times daily as needed for anxiety. 08/04/17   Wanda PlumpPaz, Jose E, MD  finasteride (PROSCAR) 5 MG tablet Take 1 tablet (5 mg total) by mouth daily. 12/12/17   Wanda PlumpPaz, Jose E, MD  fluocinonide (LIDEX) 0.05 % external solution Apply 1 application topically every evening. 09/28/16   [provider]   fluticasone (FLONASE) 50 MCG/ACT nasal spray Place 2 sprays into both nostrils daily. Patient taking differently: Place 2 sprays into both nostrils daily as needed for allergies.  01/23/15   Waldon MerlMartin, William C, PA-C  hydroxychloroquine (PLAQUENIL) 200 MG tablet Take 100 mg by mouth daily.  05/26/17   Wanda PlumpPaz, Jose E, MD  methocarbamol (ROBAXIN) 750 MG tablet Take 1 tablet (750 mg total) by mouth 2 (two) times daily as needed for muscle spasms. 01/08/18   Wanda PlumpPaz, Jose E, MD  Multiple Vitamin (MULTIVITAMIN) capsule Take 1 capsule by mouth daily.      [provider]  raloxifene (EVISTA) 60 MG tablet Take 1 tablet (60 mg total) by mouth daily. 12/12/17   Wanda PlumpPaz, Jose E, MD  simvastatin (ZOCOR) 20 MG tablet Take 1 tablet (20 mg total) by mouth at bedtime. 12/12/17   Wanda PlumpPaz, Jose E, MD  spironolactone (ALDACTONE) 25 MG tablet Take 0.5 tablets (12.5 mg total) by mouth daily. 12/12/17   Wanda PlumpPaz, Jose E, MD  traMADol (ULTRAM) 50 MG tablet Take 1 tablet (50 mg total) by mouth 3 (three) times daily as needed for moderate pain. 01/08/18   Wanda PlumpPaz, Jose E, MD    Family History Family History  Problem Relation Age of Onset  . Hypertension Father   . Other Father        cerebral hemorrhage  . Coronary artery disease Mother [id number]  . Stroke Mother   . Diabetes Mother   . Breast cancer Mother   . Hypertension Mother   . Hypertension Sister   . Breast cancer Maternal Aunt        x 2  . Breast cancer Paternal Aunt   . Colon cancer Neg Hx     Social History Social History   Tobacco Use  . Smoking status: Never Smoker  . Smokeless tobacco: Never Used  Substance Use Topics  . Alcohol use: Yes    Alcohol/week: 1.2 oz    Types: 2 Glasses of wine per week    Comment: socially wine  . Drug use: No     Allergies   Mushroom extract complex; Shellfish allergy; Oxycodone; Eggs or egg-derived products; Nyquil multi-symptom [pseudoeph-doxylamine-dm-apap]; Pseudoeph-doxylamine-dm-apap; and Tape   Review of Systems Review of  Systems  Constitutional: Negative for fever and unexpected weight change.  Respiratory: Positive for cough.   Gastrointestinal: Negative for constipation.       Negative for fecal incontinence.   Genitourinary: Negative for dysuria, flank pain, hematuria, pelvic pain, vaginal bleeding and vaginal discharge.  Negative for urinary incontinence or retention.  Musculoskeletal: Positive for back pain and myalgias. Negative for gait problem.  Neurological: Negative for weakness and numbness.       Denies saddle paresthesias.     Physical Exam Updated Vital Signs BP 119/74 (BP Location: Left Arm)   Pulse 88   Temp 98.8 F (37.1 C) (Oral)   Resp 18   Ht 5\' 3"  (1.6 m)   Wt 59.4 kg (131 lb)   SpO2 98%   BMI 23.21 kg/m   Physical Exam  Constitutional: She appears well-developed and well-nourished.  HENT:  Head: Normocephalic and atraumatic.  Eyes: Conjunctivae are normal.  Neck: Normal range of motion. Neck supple.  Pulmonary/Chest: Effort normal.  Abdominal: Soft. There is no tenderness. There is no CVA tenderness.  Musculoskeletal:       Right hip: Normal. She exhibits normal range of motion, normal strength and no tenderness.       Left hip: She exhibits normal range of motion, normal strength and no tenderness.       Right knee: Normal.       Left knee: Normal.       Cervical back: She exhibits normal range of motion, no tenderness and no bony tenderness.       Thoracic back: She exhibits normal range of motion, no tenderness and no bony tenderness.       Lumbar back: She exhibits tenderness (Mild, right paraspinous musculature). She exhibits normal range of motion and no bony tenderness.  No step-off noted with palpation of spine.   Neurological: She is alert. She has normal strength and normal reflexes. No sensory deficit.  5/5 strength in entire lower extremities bilaterally. No sensation deficit.   Skin: Skin is warm and dry. No rash noted.  Psychiatric: She has a  normal mood and affect.  Nursing note and vitals reviewed.    ED Treatments / Results  Labs (all labs ordered are listed, but only abnormal results are displayed) Labs Reviewed - No data to display  EKG None  Radiology Dg Chest 2 View  Result Date: 01/08/2018 CLINICAL DATA:  Cough 4 weeks, history of hypertension EXAM: CHEST - 2 VIEW COMPARISON:  Chest x-ray of 01/31/2009 FINDINGS: The lungs remain clear. Mediastinal hilar contours are unremarkable. The heart is within normal limits in size. No acute bony abnormality is seen. Left humeral head prosthesis is noted. IMPRESSION: No active cardiopulmonary disease. Electronically Signed   By: Dwyane Dee M.D.   On: 01/08/2018 16:55    Procedures Procedures (including critical care time)  Medications Ordered in ED Medications - No data to display   Initial Impression / Assessment and Plan / ED Course  I have reviewed the triage vital signs and the nursing notes.  Pertinent labs & imaging results that were available during my care of the patient were reviewed by me and considered in my medical decision making (see chart for details).     Patient seen and examined.  Given new symptoms, will check lumbar spine plain films.  If negative, anticipate continued treatments with current therapy prescribed by primary care physician.  Vital signs reviewed and are as follows: BP 119/74 (BP Location: Left Arm)   Pulse 88   Temp 98.8 F (37.1 C) (Oral)   Resp 18   Ht 5\' 3"  (1.6 m)   Wt 59.4 kg (131 lb)   SpO2 98%   BMI 23.21 kg/m   12:17 PM Patient discussed with and seen by Dr. Deretha Emory.  X-ray results reviewed with patient.   She is very concerned about her husbands upcoming hip surgery in 7 days. She was rx 10 day supply of Tramadol and Robaxin by PCP and is afraid of running out when she is needed to care for her husband. Pt will be d/c home with rx Tramadol #10, Robaxin #10 to be filled after completion of first prescription only  if needed.   Patient counseled on use of narcotic pain medications. Counseled not to combine these medications with others containing tylenol. Urged not to drink alcohol, drive, or perform any other activities that requires focus while taking these medications. The patient verbalizes understanding and agrees with the plan.  Per [place] substance reporting database, patient receives chronic benzo from Dr. Drue Novel. No other unexpected narcotics rx.   Final Clinical Impressions(s) / ED Diagnoses   Final diagnoses:  Sciatica of right side   Patient with back pain with radicular features. No neurological deficits. Patient is ambulatory. No warning symptoms of back pain including: fecal incontinence, urinary retention or overflow incontinence, night sweats, waking from sleep with back pain, unexplained fevers or weight loss, h/o cancer, IVDU, recent trauma. No concern for cauda equina, epidural abscess, or other serious cause of back pain. Conservative measures such as rest, ice/heat and pain medicine indicated with PCP follow-up if no improvement with conservative management.    ED Discharge Orders        Ordered    traMADol (ULTRAM) 50 MG tablet  Every 6 hours PRN     01/09/18 1213    methocarbamol (ROBAXIN) 750 MG tablet  Every 12 hours PRN     01/09/18 1213       Renne Crigler, PA-C 01/09/18 1220    Vanetta Mulders, MD 01/09/18 1251

## 2018-01-09 NOTE — ED Provider Notes (Signed)
Medical screening examination/treatment/procedure(s) were conducted as a shared visit with non-physician practitioner(s) and myself.  I personally evaluated the patient during the encounter.  None   Patient seen by me along with physician assistant.  Patient has a history of back problems.  Patient normally has bilateral low back pain.  Been more so on the right side and then last evening and this morning it started to radiate around from there down the front of the right thigh.  Stopping at the level of the knee.  Patient also had trouble with knee pain without thigh pain a few days ago.  That has improved.  No fall or injury.  Patient followed by Dr. Sherlyn LickPax as her primary care doctor he has her on her treatment plan.  Today's x-rays of the lumbar spine show multiple degenerative changes at all levels from L2 through S1.  No evidence of any bony fracture or compression fracture.  Patient on exam as sensation intact to the foot on the right side good cap refill.  No swelling to the knee excellent range of motion of the knee excellent range of motion of the right hip.   Linda Jefferson, Linda Fife, MD 01/09/18 [phone]

## 2018-01-09 NOTE — Assessment & Plan Note (Signed)
Cough: Lingering cough after a virus, for completeness we will get a chest x-ray otherwise continue Flonase, add Astelin, Tessalon.  If not better in the next few days, will consider switch Lotensin to some other agent. Anxiety: Counseled. Back pain: Chronic issue, MRI 06-2017+ (rx by sports med) showed  DJD and spinal stenosis. Declined a prednisone round.   Previously Ultram helped to some extent, RX provided.  We will try Robaxin, twice a day. Warned about excessive somnolence with pain meds. Encouraged to call me when ready to see the specialist for back pain.  Unable to do it at this point because her husband is undergoing surgery.

## 2018-01-09 NOTE — ED Triage Notes (Signed)
Pt reports her usual back pain "around my waist area" for several days, saw her pcp dr. Drue NovelPaz yesterday for same, was told to rest and use heating pad. Today pt states pain is now radiating down her right leg which has never happened in the past so she presents here for eval. Denies any injury or trauma.

## 2018-01-09 NOTE — ED Notes (Signed)
ED Provider at bedside. 

## 2018-01-10 ENCOUNTER — Encounter: Payer: Self-pay | Admitting: Internal Medicine

## 2018-01-11 ENCOUNTER — Telehealth: Payer: Self-pay | Admitting: Internal Medicine

## 2018-01-12 NOTE — Telephone Encounter (Signed)
Sent!

## 2018-01-12 NOTE — Telephone Encounter (Signed)
Pt is requesting refill on clonazepam 0.5mg .   Last OV: 01/08/2018 Last Fill: 08/04/2017 #90 and 3rf  (Pt sig: 1 tab tid prn) UDS: 12/12/2017 Low risk  NCCR printed- no discrepancies noted- sent for scanning.

## 2018-01-27 ENCOUNTER — Encounter: Payer: Self-pay | Admitting: Family Medicine

## 2018-01-27 ENCOUNTER — Ambulatory Visit (INDEPENDENT_AMBULATORY_CARE_PROVIDER_SITE_OTHER): Payer: Medicare Other | Admitting: Family Medicine

## 2018-01-27 VITALS — BP 110/60 | HR 86 | Temp 98.0°F | Resp 16 | Ht 63.4 in | Wt 130.2 lb

## 2018-01-27 DIAGNOSIS — J3089 Other allergic rhinitis: Secondary | ICD-10-CM

## 2018-01-27 DIAGNOSIS — T7800XD Anaphylactic reaction due to unspecified food, subsequent encounter: Secondary | ICD-10-CM | POA: Diagnosis not present

## 2018-01-27 DIAGNOSIS — J4520 Mild intermittent asthma, uncomplicated: Secondary | ICD-10-CM

## 2018-01-27 DIAGNOSIS — J45909 Unspecified asthma, uncomplicated: Secondary | ICD-10-CM | POA: Insufficient documentation

## 2018-01-27 DIAGNOSIS — T7800XA Anaphylactic reaction due to unspecified food, initial encounter: Secondary | ICD-10-CM | POA: Insufficient documentation

## 2018-01-27 MED ORDER — AZELASTINE HCL 0.1 % NA SOLN: NASAL | 5 refills | Status: DC

## 2018-01-27 MED ORDER — MONTELUKAST SODIUM 10 MG PO TABS: ORAL_TABLET | ORAL | 5 refills | Status: DC

## 2018-01-27 NOTE — Patient Instructions (Addendum)
Continue Flonase 2 sprays in each nostril once a day as needed for stuffy nose Continue Astelin nasal spray 2 sprays twice a day as needed for sneezing and itchy nose Consider saline nasal rinses before your nasal sprays.  Stop Zyrtec and start Allegra 180 mg once a day as needed for a runny nose Continue over the counter Visine A for red or itchy eyes Begin montelukast 10 mg once a day at bedtime to prevent cough or wheeze Prednisone 10 mg tablets. Take 2 tablets once a day for 4 days, then take 1 tablet on the 5th day, then stop  Continue to avoid shellfish, eggs, and mushrooms. Call us if you are interested in an EpiPen.  Call us if this treatment plan is not working for you  Follow up in 3 months or sooner if needed

## 2018-01-27 NOTE — Progress Notes (Signed)
[address] [place] [address] Dept: [phone]  FOLLOW UP NOTE  Patient ID: Linda Jefferson, female    DOB: [birth year]  Age: 72 y.o. MRN: [id number] Date of Office Visit: 01/27/2018  Assessment  Chief Complaint: Cough and Allergic Rhinitis   HPI Linda Jefferson is a 72 year old female who presents to the clinic for a follow up visit. She was last seen in this clinic on 08/02/2015 by Dr. Clydie Braun for evaluation of food allergy and allergic rhinitis. At that time, allergic rhinitis was well controlled with cetirizine 10 mg once a day as needed. She continued to avoid mushrooms, MSG, and shellfish.  In the interim, she reports she had influenza in February 2019 followed by bronchitis. She had a chest xray on 01/08/2018 that indicated no activecardiopulmonary disease.   At today's visit, she reports that she continues to have a dry cough that occurs equally during rest and activity. She has treid [employer] with little symptom relief. Allergic rhinitis is reported as moderately well controlled with symptoms including runny nose which is clear and some throat clearing. She denies fever and sore throat. She is currently taking Flonase daily and cetirizine 10 mg once a day. She reports itchy, watery, and burning eyes for which she uses over the counter Visine A with relief. She denies asthma and gastroesophageal reflux.   She reports a previous anaphylactic reaction to egg, mushroom, and shellfish. She currently avoids these foods and does not have an EpiPen nor is she interested in having an EpiPen. She will call this office if she is interested in having an EpiPen.   Her current medications are listed in the chart.    Drug Allergies:  Allergies  Allergen Reactions  . Mushroom Extract Complex Shortness Of Breath  . Shellfish Allergy Anaphylaxis  . Oxycodone Itching and Other (See Comments)    "skin crawling" "skin crawling"  . Egg White  [Albumen, Egg] Other (See Comments)    Only eats egg whites. Egg yolk causes nausea. Only eats egg whites  . Eggs Or Egg-Derived Products Other (See Comments)    Only eats egg whites. Egg yolk causes nausea. Per patient  . Other Other (See Comments)    High blood pressure  . Pseudoeph-Doxylamine-Dm-Apap Other (See Comments)    High blood pressure.  . Pseudoeph-Doxylamine-Dm-Apap     High blood pressure  . Tape Rash and Other (See Comments)    Patient states use paper tape only. All other tapes cause rash. Patient states use paper tape only. All other tapes cause rash. Patient states use paper tape only. All other tapes cause rash. Patient states use paper tape only. All other tapes cause rash.    Physical Exam: BP 110/60 (BP Location: Left Arm, Patient Position: Sitting, Cuff Size: Normal)   Pulse 86   Temp 98 F (36.7 C) (Oral)   Resp 16   Ht 5' 3.4" (1.61 m)   Wt 130 lb 3.2 oz (59.1 kg)   SpO2 97%   BMI 22.77 kg/m    Physical Exam  Constitutional: She is oriented to person, place, and time. She appears well-developed and well-nourished.  HENT:  Head: Normocephalic.  Right Ear: External ear normal.  Left Ear: External ear normal.  Bilateral nares slightly erythematous and edematous with clear nasal drainage noted.Marland Kitchen Pharynx with thick yellow mucus noted. Eras normal. Eyes normal.   Eyes: Conjunctivae are normal.  Neck: Normal range of motion.  Cardiovascular: Normal rate, regular rhythm and normal heart  sounds.  No murmur noted.  Pulmonary/Chest: Effort normal and breath sounds normal.  Lungs clear to auscultation.  Musculoskeletal: Normal range of motion.  Neurological: She is alert and oriented to person, place, and time.  Skin: Skin is warm and dry.  Psychiatric: She has a normal mood and affect. Her behavior is normal. Judgment and thought content normal.    Diagnostics: FVC 2.67, FEV1 1.88. Predicted FVC 2.80, predicted FEV1 2.11. Spirometry is within normal range.   Assessment and Plan: 1.  Mild intermittent reactive airway disease without complication   2. Other allergic rhinitis   3. Anaphylactic shock due to food, subsequent encounter     Meds ordered this encounter  Medications  . azelastine (ASTELIN) 0.1 % nasal spray    Sig: Two sprays each nostril twice a day as needed for sneezing and itchy nose.    Dispense:  30 mL    Refill:  5  . montelukast (SINGULAIR) 10 MG tablet    Sig: One tablet once a day at bedtime to prevent cough or wheeze.    Dispense:  30 tablet    Refill:  5    Patient Instructions  Continue Flonase 2 sprays in each nostril once a day as needed for stuffy nose Continue Astelin nasal spray 2 sprays twice a day as needed for sneezing and itchy nose Consider saline nasal rinses before your nasal sprays.  Stop Zyrtec and start Allegra 180 mg once a day as needed for a runny nose Continue over the counter Visine A for red or itchy eyes Begin montelukast 10 mg once a day at bedtime to prevent cough or wheeze Prednisone 10 mg tablets. Take 2 tablets once a day for 4 days, then take 1 tablet on the 5th day, then stop  Continue to avoid shellfish, eggs, and mushrooms. Call us if you are interested in an EpiPen.  Call us if this treatment plan is not working for you  Follow up in 3 months or sooner if needed   Return in about 3 months (around 04/28/2018), or if symptoms worsen or fail to improve.   Thank you for the opportunity to care for this patient.  Please do not hesitate to contact me with questions.  Thermon LeylandAnne Ambs, FNP [facility]  I have provided oversight concerning Thermon Leylandnne Ambs' evaluation and treatment of this patient's health issues addressed during today's encounter. I agree with the assessment and therapeutic plan as outlined in the note.   Thank you for the opportunity to care for this patient.  Please do not hesitate to contact me with questions.  Tonette BihariJ. A. Bardelas, M.D.  [facility] [address] [place], [place] [address] [phone]

## 2018-02-21 ENCOUNTER — Encounter: Payer: Self-pay | Admitting: Internal Medicine

## 2018-02-27 ENCOUNTER — Other Ambulatory Visit (INDEPENDENT_AMBULATORY_CARE_PROVIDER_SITE_OTHER): Payer: Medicare Other

## 2018-02-27 DIAGNOSIS — I10 Essential (primary) hypertension: Secondary | ICD-10-CM

## 2018-02-27 DIAGNOSIS — R945 Abnormal results of liver function studies: Secondary | ICD-10-CM

## 2018-02-27 DIAGNOSIS — R7989 Other specified abnormal findings of blood chemistry: Secondary | ICD-10-CM

## 2018-02-27 LAB — HEPATIC FUNCTION PANEL
ALT: 21 U/L (ref 0–35)
AST: 27 U/L (ref 0–37)
Albumin: 4.2 g/dL (ref 3.5–5.2)
Alkaline Phosphatase: 52 U/L (ref 39–117)
Bilirubin, Direct: 0.1 mg/dL (ref 0.0–0.3)
Total Bilirubin: 0.4 mg/dL (ref 0.2–1.2)
Total Protein: 6.6 g/dL (ref 6.0–8.3)

## 2018-02-27 LAB — BASIC METABOLIC PANEL
BUN: 21 mg/dL (ref 6–23)
CO2: 28 mEq/L (ref 19–32)
Calcium: 10.1 mg/dL (ref 8.4–10.5)
Chloride: 96 mEq/L (ref 96–112)
Creatinine, Ser: 0.89 mg/dL (ref 0.40–1.20)
GFR: 66.07 mL/min (ref >60.00)
Glucose, Bld: 100 mg/dL — ABNORMAL HIGH (ref 70–99)
Potassium: 4.8 mEq/L (ref 3.5–5.1)
Sodium: 131 mEq/L — ABNORMAL LOW (ref 135–145)

## 2018-03-27 ENCOUNTER — Encounter: Payer: Self-pay | Admitting: Gastroenterology

## 2018-03-27 ENCOUNTER — Ambulatory Visit: Payer: Medicare Other | Admitting: Gastroenterology

## 2018-03-27 VITALS — BP 110/52 | HR 62 | Ht 64.0 in | Wt 130.0 lb

## 2018-03-27 DIAGNOSIS — K589 Irritable bowel syndrome without diarrhea: Secondary | ICD-10-CM | POA: Diagnosis not present

## 2018-03-27 NOTE — Progress Notes (Signed)
03/27/2018 Linda Jefferson [id number] 19-Feb-1946   HISTORY OF PRESENT ILLNESS:  This is a 72 year old female who is a patient of Dr. Elana Alm.  Last colonoscopy was 12/2016 at which time she was found to have only internal hemorrhoids.  Biopsies taken throughout the colon and were normal.  She presents to our office today with complaints of alternating bowel movements.  Usually has constipation, sometimes going one week without a BM.  Then she will get episodes of abdominal cramping with explosive diarrhea.  When she has the constipation she treats it with something like 2 ex-lax pills, then ends up with diarrhea.  Had a normal BM today.  Also complains of a lot of bloating.  Has tried some probiotic in the past (saccromyces boulardii) without much results.     Past Medical History:  Diagnosis Date  . Anxiety   . Carotid artery disease (HCC)    per u/s 01/2009- repeated u/s 7/11 "mild dz, no stenosis"  . Colitis   . Dyslipidemia   . Food allergy    mushrooms  . Gallstones   . Hyperlipidemia   . Hypertension   . LBP (low back pain) 3/11   after a local injection, f/u [facility]  . Normal cardiac stress test 04/2009  . Osteopenia   . Seasonal allergies   . 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 Wears hearing aid    Past Surgical History:  Procedure Laterality Date  . BUNIONECTOMY Bilateral 03/2013  . CARPAL TUNNEL RELEASE Right   . CHOLECYSTECTOMY    . TOTAL SHOULDER ARTHROPLASTY Left 04/2016  . TRIGGER FINGER RELEASE  03/31/2012   Procedure: RELEASE TRIGGER FINGER/A-1 PULLEY;  Surgeon: Wyn Forster., MD;  Location: [facility];  Service: Orthopedics;  Laterality: Right;  right index    reports that she has never smoked. She has never used smokeless tobacco. She reports that she drinks about 1.2 oz of alcohol per week. She reports that she does not use drugs. family history includes Breast cancer in her maternal aunt, mother, and paternal  aunt; Coronary artery disease (age of onset: 97) in her mother; Diabetes in her mother; Hypertension in her father, mother, and sister; Other in her father; Stroke in her mother. Allergies  Allergen Reactions  . Mushroom Extract Complex Shortness Of Breath  . Shellfish Allergy Anaphylaxis  . Oxycodone Itching and Other (See Comments)    "skin crawling" "skin crawling"  . Egg White  [Albumen, Egg] Other (See Comments)    Only eats egg whites. Egg yolk causes nausea. Only eats egg whites  . Eggs Or Egg-Derived Products Other (See Comments)    Only eats egg whites. Egg yolk causes nausea. Per patient  . Other Other (See Comments)    High blood pressure  . Pseudoeph-Doxylamine-Dm-Apap Other (See Comments)    High blood pressure.  . Pseudoeph-Doxylamine-Dm-Apap     High blood pressure  . Tape Rash and Other (See Comments)    Patient states use paper tape only. All other tapes cause rash. Patient states use paper tape only. All other tapes cause rash. Patient states use paper tape only. All other tapes cause rash. Patient states use paper tape only. All other tapes cause rash.      Outpatient Encounter Medications as of 03/27/2018  Medication Sig  . amLODipine-benazepril (LOTREL) 5-10 MG capsule Take 1 capsule by mouth daily.  . Artificial Tear Ointment (DRY EYES OP) Place 2 drops  into both eyes as needed (for dry eyes).  Marland Kitchen. aspirin EC 81 MG tablet Take 81 mg by mouth daily.  Marland Kitchen. azelastine (ASTELIN) 0.1 % nasal spray Two sprays each nostril twice a day as needed for sneezing and itchy nose.  . clonazePAM (KLONOPIN) 0.5 MG tablet Take 1 tablet (0.5 mg total) by mouth 3 (three) times daily as needed for anxiety.  Marland Kitchen. Fexofenadine HCl (ALLEGRA ALLERGY PO) Take 1 tablet by mouth daily.  . finasteride (PROSCAR) 5 MG tablet Take 1 tablet (5 mg total) by mouth daily.  . fluticasone (FLONASE) 50 MCG/ACT nasal spray Place 2 sprays into both nostrils daily. (Patient taking differently: Place 2  sprays into both nostrils daily as needed for allergies. )  . hydroxychloroquine (PLAQUENIL) 200 MG tablet Take 100 mg by mouth daily.   . montelukast (SINGULAIR) 10 MG tablet One tablet once a day at bedtime to prevent cough or wheeze.  . Multiple Vitamin (MULTIVITAMIN) capsule Take 1 capsule by mouth daily.    . raloxifene (EVISTA) 60 MG tablet Take 1 tablet (60 mg total) by mouth daily.  . simvastatin (ZOCOR) 20 MG tablet Take 1 tablet (20 mg total) by mouth at bedtime.  . [DISCONTINUED] albuterol (PROVENTIL HFA) 108 (90 Base) MCG/ACT inhaler Inhale into the lungs.  . [DISCONTINUED] benzonatate (TESSALON) 200 MG capsule Take 1 capsule (200 mg total) by mouth 2 (two) times daily as needed for cough. (Patient not taking: Reported on 01/27/2018)  . [DISCONTINUED] cetirizine (ZYRTEC) 10 MG tablet Take 10 mg by mouth daily.  . [DISCONTINUED] methocarbamol (ROBAXIN) 750 MG tablet Take 1 tablet (750 mg total) by mouth every 12 (twelve) hours as needed for muscle spasms. (Patient not taking: Reported on 01/27/2018)  . [DISCONTINUED] spironolactone (ALDACTONE) 25 MG tablet Take 0.5 tablets (12.5 mg total) by mouth daily.  . [DISCONTINUED] traMADol (ULTRAM) 50 MG tablet Take 1 tablet (50 mg total) by mouth every 6 (six) hours as needed. (Patient not taking: Reported on 01/27/2018)   No facility-administered encounter medications on file as of 03/27/2018.      REVIEW OF SYSTEMS  : All other systems reviewed and negative except where noted in the History of Present Illness.   PHYSICAL EXAM: BP (!) 110/52   Pulse 62   Ht 5\' 4"  (1.626 m)   Wt 130 lb (59 kg)   BMI 22.31 kg/m  General: Well developed white female in no acute distress Head: Normocephalic and atraumatic Eyes:  Sclerae anicteric, conjunctiva pink. Ears: Normal auditory acuity Lungs: Clear throughout to auscultation; no increased WOB. Heart: Regular rate and rhythm; no M/R/G. Abdomen: Soft, non-distended.  BS present.   Non-tender. Musculoskeletal: Symmetrical with no gross deformities  Skin: No lesions on visible extremities Extremities: No edema  Neurological: Alert oriented x 4, grossly non-focal Psychological:  Alert and cooperative. Normal mood and affect  ASSESSMENT AND PLAN: *IBS-M:  Alternates between constipation and episodes of loose stools/diarrhea.  Constipation seems predominant with a few days of diarrhea per month.  Will start Miralax daily and consider adding daily powder fiber such as Benefiber or Citrucel as well.  Will try IBgard prn for bloating/gas.  **25 minutes spent with the patient in which at least 50% was spend discussing of treatment options, etc.   CC:  Wanda PlumpPaz, Jose E, MD

## 2018-03-27 NOTE — Patient Instructions (Signed)
If you are age 73 or older, your body mass index should be between 23-30. Your Body mass index is 22.31 kg/m. If this is out of the aforementioned range listed, please consider follow up with your Primary Care Provider.  If you are age 73 or younger, your body mass index should be between 19-25. Your Body mass index is 22.31 kg/m. If this is out of the aformentioned range listed, please consider follow up with your Primary Care Provider.   Take Miralax daily.  Consider adding daily fiber powder such as Benefiber or Citrucel as well.  You have been given samples of IBgard as needed for gas and bloating.  Follow up as needed.  Thank you for choosing me and [facility].  Doug SouJessica Zehr, PA-C

## 2018-03-30 ENCOUNTER — Encounter: Payer: Self-pay | Admitting: Gastroenterology

## 2018-03-31 ENCOUNTER — Telehealth: Payer: Self-pay | Admitting: Gastroenterology

## 2018-03-31 NOTE — Telephone Encounter (Signed)
Left message on machine to call back  

## 2018-04-01 ENCOUNTER — Encounter: Payer: Self-pay | Admitting: Internal Medicine

## 2018-04-01 ENCOUNTER — Other Ambulatory Visit: Payer: Self-pay | Admitting: Internal Medicine

## 2018-04-01 MED ORDER — CLONAZEPAM 0.5 MG PO TABS: 0.5000 mg | ORAL_TABLET | Freq: Three times a day (TID) | ORAL | 5 refills | Status: DC | PRN

## 2018-04-01 NOTE — Telephone Encounter (Signed)
The pt was advised to begin benefiber or citrucel as recommended and use miralax daily as advised per Shanda BumpsJessica unless she develops diarrhea.  She will also try Ibguard for gas and bloating as needed.  She will call back with any further complaints or problems

## 2018-04-03 ENCOUNTER — Encounter: Payer: Self-pay | Admitting: Gastroenterology

## 2018-04-03 DIAGNOSIS — K589 Irritable bowel syndrome without diarrhea: Secondary | ICD-10-CM | POA: Insufficient documentation

## 2018-04-03 NOTE — Progress Notes (Signed)
Reviewed and agree with documentation and assessment and plan. K. Veena Nandigam , MD   

## 2018-04-17 ENCOUNTER — Telehealth: Payer: Self-pay | Admitting: Gastroenterology

## 2018-04-17 NOTE — Telephone Encounter (Signed)
Discussed with patient.  It is very difficult to follow her story.  Sounds like she took Miralax by itself and had loose stools, which have continued despite discontinuing the Miralax.  Not using Benefiber (has only took a couple of doses because she did not understand to take it regularly).  Also chasing loose stools with Imodium.  I have asked her to avoid the Miralax and begin taking the Benefiber powder 2 tsp once daily and increase to twice daily if possible over the next week.  Will call back with an update on symptoms next week.  If continues without a lot of loose stools then may need stool studies as her "cycles" or "episodes" of diarrhea do not usually last quite so long.  Please save this to her chart.  Thank you,  Jess

## 2018-04-17 NOTE — Telephone Encounter (Signed)
Linda BumpsJessica the pt is not willing to speak to anyone other than you.  Will you call?

## 2018-04-27 ENCOUNTER — Encounter: Payer: Self-pay | Admitting: Internal Medicine

## 2018-04-28 ENCOUNTER — Encounter: Payer: Self-pay | Admitting: Gastroenterology

## 2018-04-29 ENCOUNTER — Encounter: Payer: Self-pay | Admitting: Gastroenterology

## 2018-05-05 ENCOUNTER — Telehealth: Payer: Self-pay | Admitting: Gastroenterology

## 2018-05-05 NOTE — Telephone Encounter (Signed)
The pt states she has 3-4 loose watery stools when she has the "runs"  Wants to know why she is to try Amitiza if she has the runs.  She says she goes 3 or 4 days with constipation and then 3 or 4 days with diarrhea.  She did confirm she stopped miralax days ago and has not taken imodium.  Please advise     Please let the patient know that I discussed with Dr. Lavon PaganiniNandigam and she would like her to discontinue the Miralax and try Amitiza 8 mcg BID with food instead. Please have her come in for some samples to last her about 10 days if possible. She needs to continue the medication for several days for her body to get used to the medication. She is to continue the Benefiber 4 tsp daily as well. She should not be using Imodium unless she has 3 BM's per day. Also, please clarify what she means by "runs". How many times a day is she having BM's when she has "runs"?

## 2018-05-06 NOTE — Telephone Encounter (Signed)
The amitiza was suggested by Dr. Lavon PaganiniNandigam as an alternate to the Miralax since it sounds like we are having a hard time titrating that.  She does have underlying chronic constipation so is going to need something ultimately.  Does Dr. Lavon PaganiniNandigam have any OV soon?  I think she needs to come in to discuss with her further.  Thank you,  Jess

## 2018-05-06 NOTE — Telephone Encounter (Signed)
The pt has been scheduled with Dr Lavon PaganiniNandigam per Shanda BumpsJessica for 05/07/18 at 845 am.  The patient has been notified of this information and all questions answered.

## 2018-05-07 ENCOUNTER — Ambulatory Visit: Payer: Medicare Other | Admitting: Gastroenterology

## 2018-05-07 ENCOUNTER — Telehealth: Payer: Self-pay | Admitting: *Deleted

## 2018-05-07 ENCOUNTER — Ambulatory Visit (INDEPENDENT_AMBULATORY_CARE_PROVIDER_SITE_OTHER)
Admission: RE | Admit: 2018-05-07 | Discharge: 2018-05-07 | Disposition: A | Discharge status: Home / Self Care | Payer: Medicare Other | Source: Ambulatory Visit | Attending: Gastroenterology | Admitting: Gastroenterology

## 2018-05-07 ENCOUNTER — Encounter: Payer: Self-pay | Admitting: Gastroenterology

## 2018-05-07 VITALS — BP 116/68 | HR 72 | Ht 64.0 in | Wt 130.0 lb

## 2018-05-07 DIAGNOSIS — R197 Diarrhea, unspecified: Secondary | ICD-10-CM

## 2018-05-07 DIAGNOSIS — K588 Other irritable bowel syndrome: Secondary | ICD-10-CM | POA: Diagnosis not present

## 2018-05-07 DIAGNOSIS — R140 Abdominal distension (gaseous): Secondary | ICD-10-CM

## 2018-05-07 DIAGNOSIS — K5909 Other constipation: Secondary | ICD-10-CM | POA: Diagnosis not present

## 2018-05-07 MED ORDER — AMBULATORY NON FORMULARY MEDICATION: 1 refills | Status: DC

## 2018-05-07 MED ORDER — HYDROCORTISONE ACETATE 25 MG RE SUPP: 25.0000 mg | Freq: Every day | RECTAL | 1 refills | Status: DC

## 2018-05-07 MED ORDER — DIPHENOXYLATE-ATROPINE 2.5-0.025 MG PO TABS: 1.0000 | ORAL_TABLET | Freq: Three times a day (TID) | ORAL | 1 refills | Status: DC | PRN

## 2018-05-07 NOTE — Patient Instructions (Addendum)
Go to the basement today for your abdominal XRAY, We will call you with results of the abdominal XRAY  STOP Probiotic  Take Miralax 1/2 capful daily in the AM  Take Benefiber 1 teaspoon three times a day with meals  Do Bowel Purge  With Prep kit if needed  We have given you an IBS Diet and Symptom sheet to document your food intake and symptoms after meals, bring this with you at your next appointment    Diet for Irritable Bowel Syndrome When you have irritable bowel syndrome (IBS), the foods you eat and your eating habits are very important. IBS may cause various symptoms, such as abdominal pain, constipation, or diarrhea. Choosing the right foods can help ease discomfort caused by these symptoms. Work with your health care provider and dietitian to find the best eating plan to help control your symptoms. What general guidelines do I need to follow?  Keep a food diary. This will help you identify foods that cause symptoms. Write down: ? What you eat and when. ? What symptoms you have. ? When symptoms occur in relation to your meals.  Avoid foods that cause symptoms. Talk with your dietitian about other ways to get the same nutrients that are in these foods.  Eat more foods that contain fiber. Take a fiber supplement if directed by your dietitian.  Eat your meals slowly, in a relaxed setting.  Aim to eat 5-6 small meals per day. Do not skip meals.  Drink enough fluids to keep your urine clear or pale yellow.  Ask your health care provider if you should take an over-the-counter probiotic during flare-ups to help restore healthy gut bacteria.  If you have cramping or diarrhea, try making your meals low in fat and high in carbohydrates. Examples of carbohydrates are pasta, rice, whole grain breads and cereals, fruits, and vegetables.  If dairy products cause your symptoms to flare up, try eating less of them. You might be able to handle yogurt better than other dairy products  because it contains bacteria that help with digestion. What foods are not recommended? The following are some foods and drinks that may worsen your symptoms:  Fatty foods, such as [place] fries.  Milk products, such as cheese or ice cream.  Chocolate.  Alcohol.  Products with caffeine, such as coffee.  Carbonated drinks, such as soda.  The items listed above may not be a complete list of foods and beverages to avoid. Contact your dietitian for more information. What foods are good sources of fiber? Your health care provider or dietitian may recommend that you eat more foods that contain fiber. Fiber can help reduce constipation and other IBS symptoms. Add foods with fiber to your diet a little at a time so that your body can get used to them. Too much fiber at once might cause gas and swelling of your abdomen. The following are some foods that are good sources of fiber:  Apples.  Peaches.  Pears.  Berries.  Figs.  Broccoli (raw).  Cabbage.  Carrots.  Raw peas.  Kidney beans.  Lima beans.  Whole grain bread.  Whole grain cereal.  Where to find more information: [employer] for Functional Gastrointestinal Disorders: www.iffgd.[employer] of Diabetes and Digestive and Kidney Diseases: NetworkAffair.co.za.aspx This information is not intended to replace advice given to you by your health care provider. Make sure you discuss any questions you have with your health care provider. Document Released: 12/14/2003 Document Revised: 02/29/2016 Document Reviewed: 12/24/2013  Chartered certified accountant Patient Education  [employer].

## 2018-05-07 NOTE — Progress Notes (Signed)
Tressia MinersKathleen L Abila    [id number]    07/03/1946  Primary Care Physician:Paz, Nolon RodJose E, MD  Referring Physician: Wanda PlumpPaz, Jose E, MD [address] [place], [place] [id number]  Chief complaint: Constipation alternating with diarrhea  HPI: [age] yr F with history of chronic irritable bowel syndrome recent change in bowel habits with predominant constipation alternating with diarrhea.  She was last seen by Doug SouJessica Zehr on April 26, 2018.  She was advised to start MiraLAX and Benefiber.  Feels Benefiber is helping but continues to have significant constipation, some weeks she does not have a bowel movement for about 5 to 7 days followed by multiple bowel movements with semi-formed to loose stool which lasts for 1 to 2 days.  She feels this is affecting her lifestyle and she cannot plan her days or do anything when that happens.  She took MiraLAX for only few days and then stopped as was causing diarrhea and was also unclear how much she needs to take.  She is reluctant to take other laxatives, Amitiza as she is worried about potential side effects. Also complaining of excessive bloating with increased flatulence.  She has intermittent abdominal cramping. Denies nausea, vomiting, loss of appetite or loss of weight.  Colonoscopy March 2018 showed small internal hemorrhoids otherwise unremarkable exam Outpatient Encounter Medications as of 05/07/2018  Medication Sig  . amLODipine-benazepril (LOTREL) 5-10 MG capsule Take 1 capsule by mouth daily.  . Artificial Tear Ointment (DRY EYES OP) Place 2 drops into both eyes as needed (for dry eyes).  Marland Kitchen. aspirin EC 81 MG tablet Take 81 mg by mouth daily.  Marland Kitchen. azelastine (ASTELIN) 0.1 % nasal spray Two sprays each nostril twice a day as needed for sneezing and itchy nose.  . clonazePAM (KLONOPIN) 0.5 MG tablet Take 1 tablet (0.5 mg total) by mouth 3 (three) times daily as needed for anxiety.  Marland Kitchen. Fexofenadine HCl (ALLEGRA ALLERGY PO) Take 1 tablet by  mouth daily.  . finasteride (PROSCAR) 5 MG tablet Take 1 tablet (5 mg total) by mouth daily.  . fluticasone (FLONASE) 50 MCG/ACT nasal spray Place 2 sprays into both nostrils daily. (Patient taking differently: Place 2 sprays into both nostrils daily as needed for allergies. )  . hydroxychloroquine (PLAQUENIL) 200 MG tablet Take 100 mg by mouth daily.   . Multiple Vitamin (MULTIVITAMIN) capsule Take 1 capsule by mouth daily.    . raloxifene (EVISTA) 60 MG tablet Take 1 tablet (60 mg total) by mouth daily.  . simvastatin (ZOCOR) 20 MG tablet Take 1 tablet (20 mg total) by mouth at bedtime.  . [DISCONTINUED] montelukast (SINGULAIR) 10 MG tablet One tablet once a day at bedtime to prevent cough or wheeze.   No facility-administered encounter medications on file as of 05/07/2018.     Allergies as of 05/07/2018 - Review Complete 05/07/2018  Allergen Reaction Noted  . Mushroom extract complex Shortness Of Breath 08/09/2015  . Shellfish allergy Anaphylaxis 08/03/2014  . Oxycodone Itching and Other (See Comments) 05/17/2016  . Egg white  [albumen, egg] Other (See Comments) 09/02/2012  . Eggs or egg-derived products Other (See Comments) 09/02/2012  . Other Other (See Comments) 05/20/2008  . Pseudoeph-doxylamine-dm-apap Other (See Comments) 05/20/2008  . Pseudoeph-doxylamine-dm-apap  04/05/2016  . Tape Rash and Other (See Comments) 04/05/2016    Past Medical History:  Diagnosis Date  . Anxiety   . Carotid artery disease (HCC)    per u/s 01/2009-  repeated u/s 7/11 "mild dz, no stenosis"  . Colitis   . Dyslipidemia   . Food allergy    mushrooms  . Gallstones   . Hyperlipidemia   . Hypertension   . LBP (low back pain) 3/11   after a local injection, f/u [facility]  . Normal cardiac stress test 04/2009  . Osteopenia   . Seasonal allergies   . 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 Wears hearing aid     Past Surgical History:  Procedure Laterality Date  . BUNIONECTOMY  Bilateral 03/2013  . CARPAL TUNNEL RELEASE Right   . CHOLECYSTECTOMY    . TOTAL SHOULDER ARTHROPLASTY Left 04/2016  . TRIGGER FINGER RELEASE  03/31/2012   Procedure: RELEASE TRIGGER FINGER/A-1 PULLEY;  Surgeon: Wyn Forster., MD;  Location: [facility];  Service: Orthopedics;  Laterality: Right;  right index    Family History  Problem Relation Age of Onset  . Hypertension Father   . Other Father        cerebral hemorrhage  . Coronary artery disease Mother 74  . Stroke Mother   . Diabetes Mother   . Breast cancer Mother   . Hypertension Mother   . Hypertension Sister   . Breast cancer Maternal Aunt        x 2  . Breast cancer Paternal Aunt   . Colon cancer Neg Hx     Social History   Socioeconomic History  . Marital status: Married    Spouse name: Not on file  . Number of children: 3  . Years of education: Not on file  . Highest education level: Not on file  Occupational History  . 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 Financial resource strain: Not on file  . Food insecurity:    Worry: Not on file    Inability: Not on file  . Transportation needs:    Medical: Not on file    Non-medical: Not on file  Tobacco Use  . Smoking status: Never Smoker  . Smokeless tobacco: Never Used  Substance and Sexual Activity  . Alcohol use: Yes    Alcohol/week: 1.2 oz    Types: 2 Glasses of wine per week    Comment: socially wine  . Drug use: No  . Sexual activity: Yes  Lifestyle  . Physical activity:    Days per week: Not on file    Minutes per session: Not on file  . Stress: Not on file  Relationships  . Social connections:    Talks on phone: Not on file    Gets together: Not on file    Attends religious service: Not on file    Active member of club or organization: Not on file    Attends meetings of clubs or organizations: Not on file    Relationship status: Not on file  . Intimate partner violence:     Fear of current or ex partner: Not on file    Emotionally abused: Not on file    Physically abused: Not on file    Forced sexual activity: Not on file  Other Topics Concern  . Not on file  Social History Narrative   Household: pt and husband   3 children, one in [place] (PhD)                  Review of systems: Review of Systems  Constitutional: Negative  for fever and chills.  HENT: Negative.   Eyes: Negative for blurred vision.  Respiratory: Negative for cough, shortness of breath and wheezing.   Cardiovascular: Negative for chest pain and palpitations.  Gastrointestinal: as per HPI Genitourinary: Negative for dysuria, urgency, frequency and hematuria.  Musculoskeletal: Negative for myalgias, back pain and joint pain.  Skin: Negative for itching and rash.  Neurological: Negative for dizziness, tremors, focal weakness, seizures and loss of consciousness.  Endo/Heme/Allergies: Positive for seasonal allergies.  Psychiatric/Behavioral: Negative for depression, suicidal ideas and hallucinations.  All other systems reviewed and are negative.   Physical Exam: Vitals:   05/07/18 0846  BP: 116/68  Pulse: 72   Body mass index is 22.31 kg/m. Gen:      No acute distress HEENT:  EOMI, sclera anicteric Neck:     No masses; no thyromegaly Lungs:    Clear to auscultation bilaterally; normal respiratory effort CV:         Regular rate and rhythm; no murmurs Abd:      + bowel sounds; soft, non-tender; no palpable masses, no distension Ext:    No edema; adequate peripheral perfusion Skin:      Warm and dry; no rash Neuro: alert and oriented x 3 Psych: normal mood and affect  Data Reviewed:  Reviewed labs, radiology imaging, old records and pertinent past GI work up   Assessment and Plan/Recommendations:  72 year old female with irritable bowel syndrome predominant constipation alternating with diarrhea Obtain abdominal x-ray to check for increased stool burden, if has  stool burden in the colon will do bowel purge Start MiraLAX half capful daily and titrate up or down based on response, goal 1-2 soft bowel movements daily Benefiber to 1 teaspoon 3 times daily with meals Advised patient to make a note of her symptoms and also diet to see if she has any relationship Stop probiotic If Continues to have significant symptoms, will consider further evaluation with anorectal manometry to exclude dyssynergy defecation/pelvic floor dysfunction  >40 minutes was spent face-to-face with the patient. Greater than 50% of the time used for counseling as well as treatment plan and follow-up. She had multiple questions which were answered to her satisfaction  K. Scherry Ran , MD [phone]    CC: Wanda Plump, MD

## 2018-05-08 ENCOUNTER — Other Ambulatory Visit: Payer: Self-pay

## 2018-05-08 NOTE — Telephone Encounter (Signed)
[employer]om[employer] prior auth approved until 10/06/2018  Faxed auth to [facility] today

## 2018-05-13 ENCOUNTER — Encounter: Payer: Self-pay | Admitting: Gastroenterology

## 2018-05-28 ENCOUNTER — Other Ambulatory Visit: Payer: Self-pay

## 2018-05-28 MED ORDER — HYDROCORTISONE ACE-PRAMOXINE 1-1 % RE CREA: 1.0000 "application " | TOPICAL_CREAM | Freq: Two times a day (BID) | RECTAL | 0 refills | Status: DC

## 2018-06-01 ENCOUNTER — Telehealth: Payer: Self-pay | Admitting: Internal Medicine

## 2018-06-01 DIAGNOSIS — M25561 Pain in right knee: Secondary | ICD-10-CM

## 2018-06-01 NOTE — Telephone Encounter (Signed)
Copied from CRM [phone]. Topic: Referral - Request >> Jun 01, 2018  1:38 PM Luanna Coleawoud, Jessica L wrote: CRM for notification. See Telephone encounter for: 06/01/18. Pt called and stated that she is having right knee pain and would like a referral to orthopedic specialist.

## 2018-06-02 ENCOUNTER — Telehealth: Payer: Self-pay

## 2018-06-02 NOTE — Telephone Encounter (Signed)
Spoke with Linda Jefferson. She reports intermittent right knee pain for a couple of months. Linda [facility] and had an xray and was told  it was pseudogout.  Linda Jefferson has been going to gym recently and walking on treadmill 3 x weekly. Does stretches. Concerned that knee makes a "click / clack" sound when she walks x 1 month and more constant x 2 weeks. Linda Jefferson will try to get a copy of the xray sent to us but is requesting ortho referral. She does not want to see Dr Jordan LikesSchmitz and prefers not to see sports medicine. Please advise?

## 2018-06-02 NOTE — Telephone Encounter (Signed)
Arrange a orthopedic referral to the practice of her choice

## 2018-06-02 NOTE — Telephone Encounter (Signed)
Copied from CRM [phone]. Topic: Referral - Request >> Jun 01, 2018  1:38 PM Luanna Coleawoud, Jessica L wrote: CRM for notification. See Telephone encounter for: 06/01/18. Pt called and stated that she is having right knee pain and would like a referral to orthopedic specialist. >> Jun 02, 2018  3:54 PM Terisa Starraylor, Brittany L wrote: Patient does not want to see a sports doctor. She would really like to know if she needs to come in and see Dr Drue NovelPaz first or can the referral be placed to an orthopedic doctor?

## 2018-06-03 NOTE — Telephone Encounter (Signed)
Referral placed. Notified pt. She prefers to see an MD only. Does not want to see a PA or NP. Note added to referral.

## 2018-06-12 ENCOUNTER — Encounter: Payer: Self-pay | Admitting: Gastroenterology

## 2018-06-12 ENCOUNTER — Ambulatory Visit: Payer: Medicare Other | Admitting: Gastroenterology

## 2018-06-12 VITALS — BP 122/64 | HR 72 | Ht 63.5 in | Wt 129.2 lb

## 2018-06-12 DIAGNOSIS — K588 Other irritable bowel syndrome: Secondary | ICD-10-CM | POA: Diagnosis not present

## 2018-06-12 DIAGNOSIS — K5909 Other constipation: Secondary | ICD-10-CM | POA: Diagnosis not present

## 2018-06-12 DIAGNOSIS — R197 Diarrhea, unspecified: Secondary | ICD-10-CM

## 2018-06-12 MED ORDER — LIDOCAINE-HYDROCORTISONE ACE 3-0.5 % RE CREA: 1.0000 | TOPICAL_CREAM | Freq: Two times a day (BID) | RECTAL | 1 refills | Status: DC

## 2018-06-12 NOTE — Patient Instructions (Addendum)
We have sent in Anamantle cream to your pharmacy   Use Benefiber 1 teaspoon three times a day with meals   Use Miralax 1 teaspoon miralax twice daily with breakfast and at bedtime  If you are age 73 or older, your body mass index should be between 23-30. Your Body mass index is 22.54 kg/m. If this is out of the aforementioned range listed, please consider follow up with your Primary Care Provider.  If you are age 61 or younger, your body mass index should be between 19-25. Your Body mass index is 22.54 kg/m. If this is out of the aformentioned range listed, please consider follow up with your Primary Care Provider.    Thank you for choosing  Gastroenterology  Philbert Riser Nandigam,MD

## 2018-06-17 ENCOUNTER — Encounter: Payer: Self-pay | Admitting: Gastroenterology

## 2018-06-17 NOTE — Progress Notes (Addendum)
Linda Jefferson    [id number]    1946-04-18  Primary Care Physician:Paz, Nolon Rod, MD  Referring Physician: Wanda Plump, MD [address] [place], [place] [address]  Chief complaint: IBS  HPI: 72 year old female with history of irritable bowel syndrome with alternating constipation and diarrhea here for follow-up visit She has been maintaining food symptom diary she is taking half a capful of MiraLAX daily but skips it often.  On reviewing the diary appears she has no bowel movement for 2 to 3 days followed by multiple bowel movements per day mostly in the morning.  She has occasional abdominal cramps.  She did not use Lomotil as she was concerned about potential side effects.  She has been trying to follow the low FODMAP diet but has not completely restricted her diet. Denies any nausea, vomiting, abdominal pain, melena or bright red blood per rectum  Colonoscopy March 2018 showed small internal hemorrhoids otherwise unremarkable exam  Outpatient Encounter Medications as of 06/12/2018  Medication Sig  . amLODipine-benazepril (LOTREL) 5-10 MG capsule Take 1 capsule by mouth daily.  . Artificial Tear Ointment (DRY EYES OP) Place 2 drops into both eyes as needed (for dry eyes).  Marland Kitchen aspirin EC 81 MG tablet Take 81 mg by mouth daily.  Marland Kitchen azelastine (ASTELIN) 0.1 % nasal spray Two sprays each nostril twice a day as needed for sneezing and itchy nose.  . clonazePAM (KLONOPIN) 0.5 MG tablet Take 1 tablet (0.5 mg total) by mouth 3 (three) times daily as needed for anxiety.  . diphenoxylate-atropine (LOMOTIL) 2.5-0.025 MG tablet Take 1 tablet by mouth every 8 (eight) hours as needed for diarrhea or loose stools.  Marland Kitchen Fexofenadine HCl (ALLEGRA ALLERGY PO) Take 1 tablet by mouth daily.  . finasteride (PROSCAR) 5 MG tablet Take 1 tablet (5 mg total) by mouth daily.  . fluticasone (FLONASE) 50 MCG/ACT nasal spray Place 2 sprays into both nostrils daily. (Patient taking  differently: Place 2 sprays into both nostrils daily as needed for allergies. )  . hydroxychloroquine (PLAQUENIL) 200 MG tablet Take 100 mg by mouth daily.   . Multiple Vitamin (MULTIVITAMIN) capsule Take 1 capsule by mouth daily.    . polyethylene glycol powder (GLYCOLAX/MIRALAX) powder Take 8.5 g by mouth daily.   . pramoxine-hydrocortisone (PROCTOCREAM-HC) 1-1 % rectal cream Place 1 application rectally 2 (two) times daily.  . raloxifene (EVISTA) 60 MG tablet Take 1 tablet (60 mg total) by mouth daily.  . simvastatin (ZOCOR) 20 MG tablet Take 1 tablet (20 mg total) by mouth at bedtime.  . Wheat Dextrin (BENEFIBER) POWD Take 1 tsp by mouth three times a day with meals  . lidocaine-hydrocortisone (ANAMANTEL HC) 3-0.5 % CREA Place 1 Applicatorful rectally 2 (two) times daily. As needed   No facility-administered encounter medications on file as of 06/12/2018.     Allergies as of 06/12/2018 - Review Complete 06/12/2018  Allergen Reaction Noted  . Mushroom extract complex Shortness Of Breath 08/09/2015  . Shellfish allergy Anaphylaxis 08/03/2014  . Oxycodone Itching and Other (See Comments) 05/17/2016  . Egg white  [albumen, egg] Other (See Comments) 09/02/2012  . Eggs or egg-derived products Other (See Comments) 09/02/2012  . Other Other (See Comments) 05/20/2008  . Pseudoeph-doxylamine-dm-apap Other (See Comments) 05/20/2008  . Pseudoeph-doxylamine-dm-apap  04/05/2016  . Tape Rash and Other (See Comments) 04/05/2016    Past Medical History:  Diagnosis Date  . Anxiety   .  Carotid artery disease (HCC)    per u/s 01/2009- repeated u/s 7/11 "mild dz, no stenosis"  . Colitis   . Dyslipidemia   . Food allergy    mushrooms  . Gallstones   . Hyperlipidemia   . Hypertension   . LBP (low back pain) 3/11   after a local injection, f/u [facility]  . Normal cardiac stress test 04/2009  . Osteopenia   . Seasonal allergies   . 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 Wears hearing  aid     Past Surgical History:  Procedure Laterality Date  . BUNIONECTOMY Bilateral 03/2013  . CARPAL TUNNEL RELEASE Right   . CHOLECYSTECTOMY    . TOTAL SHOULDER ARTHROPLASTY Left 04/2016  . TRIGGER FINGER RELEASE  03/31/2012   Procedure: RELEASE TRIGGER FINGER/A-1 PULLEY;  Surgeon: Wyn Forster., MD;  Location: [facility];  Service: Orthopedics;  Laterality: Right;  right index    Family History  Problem Relation Age of Onset  . Hypertension Father   . Other Father        cerebral hemorrhage  . Coronary artery disease Mother 14  . Stroke Mother   . Diabetes Mother   . Breast cancer Mother   . Hypertension Mother   . Hypertension Sister   . Breast cancer Maternal Aunt        x 2  . Breast cancer Paternal Aunt   . Colon cancer Neg Hx     Social History   Socioeconomic History  . Marital status: Married    Spouse name: Not on file  . Number of children: 3  . Years of education: Not on file  . Highest education level: Not on file  Occupational History  . 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 Financial resource strain: Not on file  . Food insecurity:    Worry: Not on file    Inability: Not on file  . Transportation needs:    Medical: Not on file    Non-medical: Not on file  Tobacco Use  . Smoking status: Never Smoker  . Smokeless tobacco: Never Used  Substance and Sexual Activity  . Alcohol use: Yes    Alcohol/week: 2.0 standard drinks    Types: 2 Glasses of wine per week    Comment: socially wine  . Drug use: No  . Sexual activity: Yes  Lifestyle  . Physical activity:    Days per week: Not on file    Minutes per session: Not on file  . Stress: Not on file  Relationships  . Social connections:    Talks on phone: Not on file    Gets together: Not on file    Attends religious service: Not on file    Active member of club or organization: Not on file    Attends meetings of clubs or  organizations: Not on file    Relationship status: Not on file  . Intimate partner violence:    Fear of current or ex partner: Not on file    Emotionally abused: Not on file    Physically abused: Not on file    Forced sexual activity: Not on file  Other Topics Concern  . Not on file  Social History Narrative   Household: pt and husband   3 children, one in [place] (PhD)  Review of systems: Review of Systems  Constitutional: Negative for fever and chills.  HENT: Negative.   Eyes: Negative for blurred vision.  Respiratory: Negative for cough, shortness of breath and wheezing.   Cardiovascular: Negative for chest pain and palpitations.  Gastrointestinal: as per HPI Genitourinary: Negative for dysuria, urgency, frequency and hematuria.  Musculoskeletal: Negative for myalgias, back pain and joint pain.  Skin: Negative for itching and rash.  Neurological: Negative for dizziness, tremors, focal weakness, seizures and loss of consciousness.  Endo/Heme/Allergies: Positive for seasonal allergies.  Psychiatric/Behavioral: Negative for depression, suicidal ideas and hallucinations.  All other systems reviewed and are negative.   Physical Exam: Vitals:   06/12/18 1436  BP: 122/64  Pulse: 72   Body mass index is 22.54 kg/m. Gen:      No acute distress HEENT:  EOMI, sclera anicteric Neck:     No masses; no thyromegaly Lungs:    Clear to auscultation bilaterally; normal respiratory effort CV:         Regular rate and rhythm; no murmurs Abd:      + bowel sounds; soft, non-tender; no palpable masses, no distension Ext:    No edema; adequate peripheral perfusion Skin:      Warm and dry; no rash Neuro: alert and oriented x 3 Psych: normal mood and affect  Data Reviewed:  Reviewed labs, radiology imaging, old records and pertinent past GI work up   Assessment and Plan/Recommendations: 72 year old female with history of irritable bowel syndrome with  alternating constipation and diarrhea Advised patient to continue to maintain food symptoms diary Continue Benefiber 1 teaspoon 3 times daily MiraLAX, as patient to start taking 1 teaspoon twice daily and titrate based on symptoms but avoid taking a large amount of dose at the time or stopping it for a few days Return in 2 to 3 months or sooner if needed 25 minutes was spent face-to-face with the patient. Greater than 50% of the time used for counseling as well as treatment plan and follow-up. She had multiple questions which were answered to her satisfaction  K. Scherry Ran , MD [phone]    CC: Wanda Plump, MD

## 2018-06-18 ENCOUNTER — Encounter (INDEPENDENT_AMBULATORY_CARE_PROVIDER_SITE_OTHER): Payer: Self-pay | Admitting: Orthopedic Surgery

## 2018-06-18 ENCOUNTER — Ambulatory Visit (INDEPENDENT_AMBULATORY_CARE_PROVIDER_SITE_OTHER): Payer: Medicare Other | Admitting: Orthopedic Surgery

## 2018-06-18 DIAGNOSIS — M1711 Unilateral primary osteoarthritis, right knee: Secondary | ICD-10-CM

## 2018-06-18 NOTE — Progress Notes (Signed)
Office Visit Note   Patient: Linda Jefferson           Date of Birth: [birth year]           MRN: [id number] Visit Date: 06/18/2018 Requested by: Wanda PlumpPaz, Jose E, MD [address] [place], [place] [id number] PCP: Wanda PlumpPaz, Jose E, MD  Subjective: Chief Complaint  Patient presents with  . Right Knee - Pain    HPI: Linda JohnsKathleen is a patient with right knee pain.  She reports pain popping and clicking but denies any history of injury.  Pain is been worse over the past several months likely exacerbated by walking on the treadmill at an angle.  She does get better with rest.  She has had one cortisone injection only without aspiration.  She is been doing icing and ibuprofen.  Her ibuprofen dose is pretty minimal at about 400 mg a day.  She uses a sleeve which helps as well.  She denies any frank mechanical symptoms and denies any left knee symptoms.  Outside radiographs are reviewed and show fairly minimal arthritis and she may have a component of CPPD.              ROS: All systems reviewed are negative as they relate to the chief complaint within the history of present illness.  Patient denies  fevers or chills.   Assessment & Plan: Visit Diagnoses:  1. Unilateral primary osteoarthritis, right knee     Plan: Impression is right knee mild effusion with fairly significant patellofemoral crepitus.  She likely has some degenerative process going on.  I think she may also have CPPD based on interpretation of outside radiographs.  Nonetheless her symptoms are fairly minimal at this time and I would favor aspiration and injection when her symptoms worsen.  I would do that before any type of advanced imaging.  When her symptoms worsen and she cannot walk because she would like to do than have advised her to come back in for aspiration and injection.  Follow-Up Instructions: Return if symptoms worsen or fail to improve.   Orders:  No orders of the defined types were placed in this encounter.  No  orders of the defined types were placed in this encounter.     Procedures: No procedures performed   Clinical Data: No additional findings.  Objective: Vital Signs: There were no vitals taken for this visit.  Physical Exam:   Constitutional: Patient appears well-developed HEENT:  Head: Normocephalic Eyes:EOM are normal Neck: Normal range of motion Cardiovascular: Normal rate Pulmonary/chest: Effort normal Neurologic: Patient is alert Skin: Skin is warm Psychiatric: Patient has normal mood and affect    Ortho Exam: Ortho exam demonstrates mild effusion right knee with no effusion left knee.  She has excellent muscle tone in both legs.  Pedal pulses palpable.  Collateral crucial ligaments are stable in the right knee.  There is more patellofemoral crepitus on the right than the left.  No focal joint line tenderness is present.  Specialty Comments:  No specialty comments available.  Imaging: No results found.   PMFS History: Patient Active Problem List   Diagnosis Date Noted  . Irritable bowel syndrome 04/03/2018  . Reactive airway disease 01/27/2018  . Anaphylactic shock due to adverse food reaction 01/27/2018  . Back pain (MRI 06/2017: DJD, spinal stenosis) 01/09/2018  . Lactic acidemia   . PCP NOTES >>>>> 08/12/2015  . Food allergy 08/02/2015  . Other allergic rhinitis 08/02/2015  . Allergic cough  02/14/2015  . Alopecia 08/03/2014  . Anxiety 07/23/2013  . Annual physical exam 08/07/2011  . Osteopenia 10/22/2010  . Hyperlipemia 02/18/2009  . Carotid artery disease (HCC) 02/17/2009  . HTN (hypertension) 05/20/2008   Past Medical History:  Diagnosis Date  . Anxiety   . Carotid artery disease (HCC)    per u/s 01/2009- repeated u/s 7/11 "mild dz, no stenosis"  . Colitis   . Dyslipidemia   . Food allergy    mushrooms  . Gallstones   . Hyperlipidemia   . Hypertension   . LBP (low back pain) 3/11   after a local injection, f/u [facility]  . Normal  cardiac stress test 04/2009  . Osteopenia   . Seasonal allergies   . 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 Wears hearing aid     Family History  Problem Relation Age of Onset  . Hypertension Father   . Other Father        cerebral hemorrhage  . Coronary artery disease Mother 70  . Stroke Mother   . Diabetes Mother   . Breast cancer Mother   . Hypertension Mother   . Hypertension Sister   . Breast cancer Maternal Aunt        x 2  . Breast cancer Paternal Aunt   . Colon cancer Neg Hx     Past Surgical History:  Procedure Laterality Date  . BUNIONECTOMY Bilateral 03/2013  . CARPAL TUNNEL RELEASE Right   . CHOLECYSTECTOMY    . TOTAL SHOULDER ARTHROPLASTY Left 04/2016  . TRIGGER FINGER RELEASE  03/31/2012   Procedure: RELEASE TRIGGER FINGER/A-1 PULLEY;  Surgeon: Wyn Forster., MD;  Location: [facility];  Service: Orthopedics;  Laterality: Right;  right index   Social History   Occupational History  . 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 Smoking status: Never Smoker  . Smokeless tobacco: Never Used  Substance and Sexual Activity  . Alcohol use: Yes    Alcohol/week: 2.0 standard drinks    Types: 2 Glasses of wine per week    Comment: socially wine  . Drug use: No  . Sexual activity: Yes

## 2018-07-10 ENCOUNTER — Ambulatory Visit (INDEPENDENT_AMBULATORY_CARE_PROVIDER_SITE_OTHER): Payer: Medicare Other

## 2018-07-10 DIAGNOSIS — Z23 Encounter for immunization: Secondary | ICD-10-CM

## 2018-08-03 ENCOUNTER — Ambulatory Visit: Payer: Medicare Other | Admitting: Gastroenterology

## 2018-08-03 ENCOUNTER — Encounter: Payer: Self-pay | Admitting: Gastroenterology

## 2018-08-03 VITALS — BP 110/60 | HR 64 | Ht 63.5 in | Wt 132.5 lb

## 2018-08-03 DIAGNOSIS — K582 Mixed irritable bowel syndrome: Secondary | ICD-10-CM

## 2018-08-03 NOTE — Progress Notes (Signed)
Linda Jefferson    [id number]    07-26-1946  Primary Care Physician:Paz, Nolon Rod, MD  Referring Physician: Wanda Plump, MD [address] [place], [place] [address]  Chief complaint:  IBS  HPI: 72 year old female with history of chronic irritable bowel syndrome with alternating constipation and diarrhea here for follow-up visit.  She is doing significantly better with Benefiber and MiraLAX.  She is self titrating the dose based on her symptoms.  She is having 1-2 bowel movements on average 5 to 6 days a week.  She is starting to go out to eat again as her symptoms are better controlled. Denies any nausea, vomiting, abdominal pain, melena or bright red blood per rectum  Colonoscopy March 2018 showed small internal hemorrhoids otherwise normal exam  Outpatient Encounter Medications as of 08/03/2018  Medication Sig  . amLODipine-benazepril (LOTREL) 5-10 MG capsule Take 1 capsule by mouth daily.  . Artificial Tear Ointment (DRY EYES OP) Place 2 drops into both eyes as needed (for dry eyes).  Marland Kitchen aspirin EC 81 MG tablet Take 81 mg by mouth daily.  Marland Kitchen azelastine (ASTELIN) 0.1 % nasal spray Two sprays each nostril twice a day as needed for sneezing and itchy nose.  . clonazePAM (KLONOPIN) 0.5 MG tablet Take 1 tablet (0.5 mg total) by mouth 3 (three) times daily as needed for anxiety.  . diphenoxylate-atropine (LOMOTIL) 2.5-0.025 MG tablet Take 1 tablet by mouth every 8 (eight) hours as needed for diarrhea or loose stools.  Marland Kitchen Fexofenadine HCl (ALLEGRA ALLERGY PO) Take 1 tablet by mouth daily.  . finasteride (PROSCAR) 5 MG tablet Take 1 tablet (5 mg total) by mouth daily.  . fluticasone (FLONASE) 50 MCG/ACT nasal spray Place 2 sprays into both nostrils daily. (Patient taking differently: Place 2 sprays into both nostrils daily as needed for allergies. )  . lidocaine-hydrocortisone (ANAMANTEL HC) 3-0.5 % CREA Place 1 Applicatorful rectally 2 (two) times daily. As needed  (Patient taking differently: Place 1 Applicatorful rectally as needed. As needed)  . Multiple Vitamin (MULTIVITAMIN) capsule Take 1 capsule by mouth daily.    . polyethylene glycol powder (GLYCOLAX/MIRALAX) powder Take 8.5 g by mouth daily.   . raloxifene (EVISTA) 60 MG tablet Take 1 tablet (60 mg total) by mouth daily.  . simvastatin (ZOCOR) 20 MG tablet Take 1 tablet (20 mg total) by mouth at bedtime.  . Wheat Dextrin (BENEFIBER) POWD Take 1 tsp by mouth three times a day with meals  . [DISCONTINUED] hydroxychloroquine (PLAQUENIL) 200 MG tablet Take 100 mg by mouth daily.   . [DISCONTINUED] pramoxine-hydrocortisone (PROCTOCREAM-HC) 1-1 % rectal cream Place 1 application rectally 2 (two) times daily.   No facility-administered encounter medications on file as of 08/03/2018.     Allergies as of 08/03/2018 - Review Complete 08/03/2018  Allergen Reaction Noted  . Mushroom extract complex Shortness Of Breath 08/09/2015  . Shellfish allergy Anaphylaxis 08/03/2014  . Oxycodone Itching and Other (See Comments) 05/17/2016  . Egg white  [albumen, egg] Other (See Comments) 09/02/2012  . Eggs or egg-derived products Other (See Comments) 09/02/2012  . Other Other (See Comments) 05/20/2008  . Pseudoeph-doxylamine-dm-apap Other (See Comments) 05/20/2008  . Pseudoeph-doxylamine-dm-apap  04/05/2016  . Tape Rash and Other (See Comments) 04/05/2016    Past Medical History:  Diagnosis Date  . Anxiety   . Carotid artery disease (HCC)    per u/s 01/2009- repeated u/s 7/11 "mild dz, no stenosis"  .  Colitis   . Dyslipidemia   . Food allergy    mushrooms  . Gallstones   . Hyperlipidemia   . Hypertension   . LBP (low back pain) 3/11   after a local injection, f/u [facility]  . Normal cardiac stress test 04/2009  . Osteopenia   . Seasonal allergies   . 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 Wears hearing aid     Past Surgical History:  Procedure Laterality Date  . BUNIONECTOMY  Bilateral 03/2013  . CARPAL TUNNEL RELEASE Right   . CHOLECYSTECTOMY    . TOTAL SHOULDER ARTHROPLASTY Left 04/2016  . TRIGGER FINGER RELEASE  03/31/2012   Procedure: RELEASE TRIGGER FINGER/A-1 PULLEY;  Surgeon: Wyn Forster., MD;  Location: [facility];  Service: Orthopedics;  Laterality: Right;  right index    Family History  Problem Relation Age of Onset  . Hypertension Father   . Other Father        cerebral hemorrhage  . Coronary artery disease Mother 68  . Stroke Mother   . Diabetes Mother   . Breast cancer Mother   . Hypertension Mother   . Hypertension Sister   . Breast cancer Maternal Aunt        x 2  . Breast cancer Paternal Aunt   . Colon cancer Neg Hx     Social History   Socioeconomic History  . Marital status: Married    Spouse name: Not on file  . Number of children: 3  . Years of education: Not on file  . Highest education level: Not on file  Occupational History  . 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 Financial resource strain: Not on file  . Food insecurity:    Worry: Not on file    Inability: Not on file  . Transportation needs:    Medical: Not on file    Non-medical: Not on file  Tobacco Use  . Smoking status: Never Smoker  . Smokeless tobacco: Never Used  Substance and Sexual Activity  . Alcohol use: Yes    Alcohol/week: 2.0 standard drinks    Types: 2 Glasses of wine per week    Comment: socially wine  . Drug use: No  . Sexual activity: Yes  Lifestyle  . Physical activity:    Days per week: Not on file    Minutes per session: Not on file  . Stress: Not on file  Relationships  . Social connections:    Talks on phone: Not on file    Gets together: Not on file    Attends religious service: Not on file    Active member of club or organization: Not on file    Attends meetings of clubs or organizations: Not on file    Relationship status: Not on file  . Intimate partner  violence:    Fear of current or ex partner: Not on file    Emotionally abused: Not on file    Physically abused: Not on file    Forced sexual activity: Not on file  Other Topics Concern  . Not on file  Social History Narrative   Household: pt and husband   3 children, one in [place] (PhD)                  Review of systems: Review of Systems  Constitutional: Negative for fever and chills.  HENT: Negative.  Eyes: Negative for blurred vision.  Respiratory: Negative for cough, shortness of breath and wheezing.   Cardiovascular: Negative for chest pain and palpitations.  Gastrointestinal: as per HPI Genitourinary: Negative for dysuria, urgency, frequency and hematuria.  Musculoskeletal: Negative for myalgias, back pain and joint pain.  Skin: Negative for itching and rash.  Neurological: Negative for dizziness, tremors, focal weakness, seizures and loss of consciousness.  Endo/Heme/Allergies: Negative for seasonal allergies.  Psychiatric/Behavioral: Negative for depression, suicidal ideas and hallucinations.  All other systems reviewed and are negative.   Physical Exam: Vitals:   08/03/18 0828  BP: 110/60  Pulse: 64   Body mass index is 23.1 kg/m. Gen:      No acute distress HEENT:  EOMI, sclera anicteric Neck:     No masses; no thyromegaly Lungs:    Clear to auscultation bilaterally; normal respiratory effort CV:         Regular rate and rhythm; no murmurs Abd:      + bowel sounds; soft, non-tender; no palpable masses, no distension Ext:    No edema; adequate peripheral perfusion Skin:      Warm and dry; no rash Neuro: alert and oriented x 3 Psych: normal mood and affect  Data Reviewed:  Reviewed labs, radiology imaging, old records and pertinent past GI work up   Assessment and Plan/Recommendations:  72 year old female with history of chronic irritable bowel syndrome with alternating patient and diarrhea here for follow-up visit Continue Benefiber and  MiraLAX daily, titrate dose based on response with goal 1-2 soft bowel movements daily Increase water intake to 60 ounces daily Return in 6 months or sooner if needed  15 minutes was spent face-to-face with the patient. Greater than 50% of the time used for counseling as well as treatment plan and follow-up. She had multiple questions which were answered to her satisfaction  K. Scherry Ran , MD [phone]    CC: Wanda Plump, MD

## 2018-08-03 NOTE — Patient Instructions (Signed)
Continue Benefiber and Miralax  Drink at least 60 oz of water daily  Follow up in 6-12 months  If you are age 73 or older, your body mass index should be between 23-30. Your Body mass index is 23.1 kg/m. If this is out of the aforementioned range listed, please consider follow up with your Primary Care Provider.  If you are age 67 or younger, your body mass index should be between 19-25. Your Body mass index is 23.1 kg/m. If this is out of the aformentioned range listed, please consider follow up with your Primary Care Provider.    Thank you for choosing [facility]  Philbert Riser Nandigam,MD

## 2018-08-06 ENCOUNTER — Encounter: Payer: Self-pay | Admitting: Gastroenterology

## 2018-10-07 HISTORY — PX: CATARACT EXTRACTION: SUR2

## 2018-11-02 ENCOUNTER — Other Ambulatory Visit: Payer: Self-pay | Admitting: Internal Medicine

## 2018-11-02 ENCOUNTER — Encounter: Payer: Self-pay | Admitting: Internal Medicine

## 2018-11-02 NOTE — Telephone Encounter (Signed)
Appt scheduled 12/15/2018.

## 2018-11-02 NOTE — Telephone Encounter (Signed)
Pt is requesting refill on clonazepam.   Last OV: 01/08/2018 Last Fill: 04/01/2018 #90 and 5rf Pt sig: 1 tab tid prn UDS: 12/12/2017 Low risk  NCCR printed- no discrepancies noted- sent for scanning

## 2018-11-02 NOTE — Telephone Encounter (Signed)
Advise patient Prescription sent but she is overdue for a visit.  Please arrange

## 2018-11-29 ENCOUNTER — Encounter: Payer: Self-pay | Admitting: Internal Medicine

## 2018-11-29 DIAGNOSIS — M25511 Pain in right shoulder: Secondary | ICD-10-CM

## 2018-11-30 MED ORDER — CLONAZEPAM 0.5 MG PO TABS: 0.5000 mg | ORAL_TABLET | Freq: Three times a day (TID) | ORAL | 1 refills | Status: DC | PRN

## 2018-11-30 NOTE — Addendum Note (Signed)
Addended byConrad [place] Oak D on: 11/30/2018 12:43 PM   Modules accepted: Orders

## 2018-11-30 NOTE — Telephone Encounter (Signed)
Refill request for clonazepam.   Last OV: 01/08/2018 Last Fill: 11/02/2018 #90 and 0RF Pt sig: 1 tab tid prn UDS: 12/12/2017 Low risk

## 2018-11-30 NOTE — Telephone Encounter (Signed)
Sent!

## 2018-12-14 HISTORY — PX: CATARACT EXTRACTION: SUR2

## 2018-12-14 NOTE — Progress Notes (Addendum)
Subjective:   Linda Jefferson is a 73 y.o. female who presents for Medicare Annual (Subsequent) preventive examination.  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 Also in a book club.  Review of Systems: No ROS.  Medicare Wellness Visit. Additional risk factors are reflected in the social history. Cardiac Risk Factors include: advanced age (>30men, >47 women);dyslipidemia;hypertension   Sleep patterns: Takes klonopin. Sleeps 6-7 hrs. Home Safety/Smoke Alarms: Feels safe in home. Smoke alarms in place.  Lives in split level home with husband.   Female:   Mammo- scheduled next week    Dexa scan-  ordered      CCS- next due 2028    Objective:     Vitals: BP 138/76 (BP Location: Left Arm, Patient Position: Sitting, Cuff Size: Normal)   Pulse 68   Ht 5\' 4"  (1.626 m)   Wt 135 lb 12.8 oz (61.6 kg)   SpO2 97%   BMI 23.31 kg/m   Body mass index is 23.31 kg/m.  Advanced Directives 12/15/2018 12/12/2017 11/25/2016 11/03/2016 10/29/2016 03/27/2012  Does Patient Have a Medical Advance Directive? Yes Yes Yes - Yes Patient does not have advance directive  Type of Advance Directive Healthcare Power of [place];Living will Healthcare Power of [place];Living will Living will;Healthcare Power of Attorney Living will;Healthcare Power of [employer] Power of [place];Living will -  Does patient want to make changes to medical advance directive? No - Patient declined - No - Patient declined - - -  Copy of Healthcare Power of Attorney in Chart? No - copy requested No - copy requested No - copy requested No - copy requested - -    Tobacco Social History   Tobacco Use  Smoking Status Never Smoker  Smokeless Tobacco Never Used     Counseling given: Not Answered   Clinical Intake: Pain : No/denies pain    Past Medical History:  Diagnosis Date  . Anxiety   . Carotid artery disease (HCC)    per u/s 01/2009- repeated u/s 7/11 "mild dz, no stenosis"  . Colitis   . Dyslipidemia   . Food allergy      mushrooms  . Gallstones   . Hyperlipidemia   . Hypertension   . IBS (irritable bowel syndrome)   . LBP (low back pain) 3/11   after a local injection, f/u [facility]  . Normal cardiac stress test 04/2009  . Osteopenia   . Seasonal allergies   . 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 Wears hearing aid    Past Surgical History:  Procedure Laterality Date  . BUNIONECTOMY Bilateral 03/2013  . CARPAL TUNNEL RELEASE Right   . CATARACT EXTRACTION Left 12/14/2018   Dr.Beavis  . CHOLECYSTECTOMY    . TOTAL SHOULDER ARTHROPLASTY Left 04/2016  . TRIGGER FINGER RELEASE  03/31/2012   Procedure: RELEASE TRIGGER FINGER/A-1 PULLEY;  Surgeon: Wyn Forster., MD;  Location: [facility];  Service: Orthopedics;  Laterality: Right;  right index   Family History  Problem Relation Age of Onset  . Hypertension Father   . Other Father        cerebral hemorrhage  . Coronary artery disease Mother 13  . Stroke Mother   . Diabetes Mother   . Breast cancer Mother   . Hypertension Mother   . Hypertension Sister   . Breast cancer Maternal Aunt        x 2  . Breast cancer Paternal Aunt   . Colon cancer Neg Hx  Social History   Socioeconomic History  . Marital status: Married    Spouse name: Not on file  . Number of children: 3  . Years of education: Not on file  . Highest education level: Not on file  Occupational History  . 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 Financial resource strain: Not on file  . Food insecurity:    Worry: Not on file    Inability: Not on file  . Transportation needs:    Medical: Not on file    Non-medical: Not on file  Tobacco Use  . Smoking status: Never Smoker  . Smokeless tobacco: Never Used  Substance and Sexual Activity  . Alcohol use: Yes    Alcohol/week: 2.0 standard drinks    Types: 2 Glasses of wine per week    Comment: socially wine 4-5x/week  . Drug use: No  . Sexual  activity: Yes  Lifestyle  . Physical activity:    Days per week: Not on file    Minutes per session: Not on file  . Stress: Not on file  Relationships  . Social connections:    Talks on phone: Not on file    Gets together: Not on file    Attends religious service: Not on file    Active member of club or organization: Not on file    Attends meetings of clubs or organizations: Not on file    Relationship status: Not on file  Other Topics Concern  . Not on file  Social History Narrative   Household: pt and husband   3 children, one in [place] (PhD)                Outpatient Encounter Medications as of 12/15/2018  Medication Sig  . amLODipine-benazepril (LOTREL) 5-10 MG capsule Take 1 capsule by mouth daily.  . Artificial Tear Ointment (DRY EYES OP) Place 2 drops into both eyes as needed (for dry eyes).  Marland Kitchen aspirin EC 81 MG tablet Take 81 mg by mouth daily.  Marland Kitchen azelastine (ASTELIN) 0.1 % nasal spray Two sprays each nostril twice a day as needed for sneezing and itchy nose.  . clonazePAM (KLONOPIN) 0.5 MG tablet Take 1 tablet (0.5 mg total) by mouth 3 (three) times daily as needed for anxiety.  Marland Kitchen Fexofenadine HCl (ALLEGRA ALLERGY PO) Take 1 tablet by mouth daily.  . finasteride (PROSCAR) 5 MG tablet Take 1 tablet (5 mg total) by mouth daily.  . fluticasone (FLONASE) 50 MCG/ACT nasal spray Place 2 sprays into both nostrils daily. (Patient taking differently: Place 2 sprays into both nostrils daily as needed for allergies. )  . lidocaine-hydrocortisone (ANAMANTEL HC) 3-0.5 % CREA Place 1 Applicatorful rectally 2 (two) times daily. As needed (Patient taking differently: Place 1 Applicatorful rectally as needed. As needed)  . Multiple Vitamin (MULTIVITAMIN) capsule Take 1 capsule by mouth daily.    . polyethylene glycol powder (GLYCOLAX/MIRALAX) powder Take 8.5 g by mouth daily.   . raloxifene (EVISTA) 60 MG tablet Take 1 tablet (60 mg total) by mouth daily.  . simvastatin (ZOCOR) 20  MG tablet Take 1 tablet (20 mg total) by mouth at bedtime.  . Wheat Dextrin (BENEFIBER) POWD Take 1 tsp by mouth three times a day with meals  . diphenoxylate-atropine (LOMOTIL) 2.5-0.025 MG tablet Take 1 tablet by mouth every 8 (eight) hours as needed for diarrhea or loose stools. (Patient not taking: Reported on 12/15/2018)   No facility-administered  encounter medications on file as of 12/15/2018.     Activities of Daily Living In your present state of health, do you have any difficulty performing the following activities: 12/15/2018  Hearing? Y  Comment wearing hearing aids  Vision? N  Comment had cataract sx yesterday on left eye  Difficulty concentrating or making decisions? N  Walking or climbing stairs? N  Dressing or bathing? N  Doing errands, shopping? N  Preparing Food and eating ? N  Using the Toilet? N  In the past six months, have you accidently leaked urine? N  Do you have problems with loss of bowel control? N  Managing your Medications? N  Managing your Finances? N  Housekeeping or managing your Housekeeping? N  Some recent data might be hidden    Patient Care Team: Wanda Plump, MD as PCP - General (Internal Medicine) Wanda Plump, MD (Internal Medicine) Larence Penning, OD as Referring Physician (Optometry) Jodi Geralds, MD as Consulting Physician (Orthopedic Surgery) Mikki Santee, MD (Inactive) as Consulting Physician (Allergy and Immunology)    Assessment:   This is a routine wellness examination for Linda Jefferson. Physical assessment deferred to PCP.  Exercise Activities and Dietary recommendations Current Exercise Habits: Home exercise routine, Type of exercise: walking, Time (Minutes): 30, Frequency (Times/Week): 3, Weekly Exercise (Minutes/Week): 90, Intensity: Mild, Exercise limited by: None identified Diet (meal preparation, eat out, water intake, caffeinated beverages, dairy products, fruits and vegetables): in general, a "healthy" diet  , well balanced  Goals      . Return to normal exercise routine    . Taking to a small beach trip.        Fall Risk Fall Risk  12/15/2018 12/12/2017 11/25/2016 07/05/2016 08/10/2015  Falls in the past year? 0 No No No No   Depression Screen PHQ 2/9 Scores 12/15/2018 12/12/2017 11/25/2016 07/05/2016  PHQ - 2 Score 0 0 0 0     Cognitive Function Ad8 score reviewed for issues:  Issues making decisions:no  Less interest in hobbies / activities:no  Repeats questions, stories (family complaining):no  Trouble using ordinary gadgets (microwave, computer, phone):no  Forgets the month or year: no  Mismanaging finances: no  Remembering appts:no  Daily problems with thinking and/or memory:no Ad8 score is=0  MMSE - Mini Mental State Exam 11/25/2016  Orientation to time 5  Orientation to Place 5  Registration 3  Attention/ Calculation 5  Recall 3  Language- name 2 objects 2  Language- repeat 1  Language- follow 3 step command 3  Language- read & follow direction 1  Write a sentence 1  Copy design 1  Total score 30        Immunization History  Administered Date(s) Administered  . Influenza, Quadrivalent, Recombinant, Inj, Pf 08/15/2016, 08/05/2017, 07/10/2018  . Td 02/04/2009   Screening Tests Health Maintenance  Topic Date Due  . PNA vac Low Risk Adult (1 of 2 - PCV13) 02/18/2010  . DEXA SCAN  11/27/2018  . MAMMOGRAM  12/13/2018  . TETANUS/TDAP  02/05/2019  . COLONOSCOPY  12/26/2026  . Hepatitis C Screening  Completed        Plan:    Please schedule your next medicare wellness visit with me in 1 yr.  Continue to eat heart healthy diet (full of fruits, vegetables, whole grains, lean protein, water--limit salt, fat, and sugar intake) and increase physical activity as tolerated.  Bring a copy of your living will and/or healthcare power of attorney to your next office visit.  I have  ordered you bone density scan. Please schedule.  I have personally reviewed and noted the following in the  patient's chart:   . Medical and social history . Use of alcohol, tobacco or illicit drugs  . Current medications and supplements . Functional ability and status . Nutritional status . Physical activity . Advanced directives . List of other physicians . Hospitalizations, surgeries, and ER visits in previous 12 months . Vitals . Screenings to include cognitive, depression, and falls . Referrals and appointments  In addition, I have reviewed and discussed with patient certain preventive protocols, quality metrics, and best practice recommendations. A written personalized care plan for preventive services as well as general preventive health recommendations were provided to patient.     Mady HaagensenBritt, Yazan Gatling [place]Angel, [place]  12/15/2018  Willow OraJose Paz, MD

## 2018-12-15 ENCOUNTER — Encounter: Payer: Self-pay | Admitting: *Deleted

## 2018-12-15 ENCOUNTER — Ambulatory Visit (INDEPENDENT_AMBULATORY_CARE_PROVIDER_SITE_OTHER): Payer: Medicare Other | Admitting: Internal Medicine

## 2018-12-15 ENCOUNTER — Encounter: Payer: Self-pay | Admitting: Internal Medicine

## 2018-12-15 ENCOUNTER — Ambulatory Visit (INDEPENDENT_AMBULATORY_CARE_PROVIDER_SITE_OTHER): Payer: Medicare Other | Admitting: *Deleted

## 2018-12-15 VITALS — BP 138/76 | HR 68 | Ht 64.0 in | Wt 135.8 lb

## 2018-12-15 DIAGNOSIS — Z79899 Other long term (current) drug therapy: Secondary | ICD-10-CM

## 2018-12-15 DIAGNOSIS — Z780 Asymptomatic menopausal state: Secondary | ICD-10-CM

## 2018-12-15 DIAGNOSIS — F419 Anxiety disorder, unspecified: Secondary | ICD-10-CM | POA: Diagnosis not present

## 2018-12-15 DIAGNOSIS — Z0000 Encounter for general adult medical examination without abnormal findings: Secondary | ICD-10-CM

## 2018-12-15 DIAGNOSIS — E7849 Other hyperlipidemia: Secondary | ICD-10-CM | POA: Diagnosis not present

## 2018-12-15 DIAGNOSIS — Z1231 Encounter for screening mammogram for malignant neoplasm of breast: Secondary | ICD-10-CM

## 2018-12-15 MED ORDER — SIMVASTATIN 20 MG PO TABS: 20.0000 mg | ORAL_TABLET | Freq: Every day | ORAL | 3 refills | Status: DC

## 2018-12-15 MED ORDER — AMLODIPINE BESY-BENAZEPRIL HCL 5-10 MG PO CAPS: 1.0000 | ORAL_CAPSULE | Freq: Every day | ORAL | 3 refills | Status: DC

## 2018-12-15 MED ORDER — RALOXIFENE HCL 60 MG PO TABS: 60.0000 mg | ORAL_TABLET | Freq: Every day | ORAL | 3 refills | Status: DC

## 2018-12-15 MED ORDER — FINASTERIDE 5 MG PO TABS: 5.0000 mg | ORAL_TABLET | Freq: Every day | ORAL | 3 refills | Status: DC

## 2018-12-15 MED ORDER — DICLOFENAC SODIUM 1 % TD GEL: 2.0000 g | Freq: Four times a day (QID) | TRANSDERMAL | 1 refills | Status: DC | PRN

## 2018-12-15 NOTE — Patient Instructions (Signed)
Please schedule your next medicare wellness visit with me in 1 yr.  Continue to eat heart healthy diet (full of fruits, vegetables, whole grains, lean protein, water--limit salt, fat, and sugar intake) and increase physical activity as tolerated.  Bring a copy of your living will and/or healthcare power of attorney to your next office visit.  I have ordered you bone density scan. Please schedule.   Linda Jefferson , Thank you for taking time to come for your Medicare Wellness Visit. I appreciate your ongoing commitment to your health goals. Please review the following plan we discussed and let me know if I can assist you in the future.   These are the goals we discussed: Goals    . Return to normal exercise routine    . Taking to a small beach trip.        This is a list of the screening recommended for you and due dates:  Health Maintenance  Topic Date Due  . Pneumonia vaccines (1 of 2 - PCV13) 02/18/2010  . DEXA scan (bone density measurement)  11/27/2018  . Mammogram  12/13/2018  . Tetanus Vaccine  02/05/2019  . Colon Cancer Screening  12/26/2026  .  Hepatitis C: One time screening is recommended by Center for Disease Control  (CDC) for  adults born from [birth year] through 1966.   Completed    Health Maintenance After Age 56 After age 54, you are at a higher risk for certain long-term diseases and infections as well as injuries from falls. Falls are a major cause of broken bones and head injuries in people who are older than age 45. Getting regular preventive care can help to keep you healthy and well. Preventive care includes getting regular testing and making lifestyle changes as recommended by your health care provider. Talk with your health care provider about:  Which screenings and tests you should have. A screening is a test that checks for a disease when you have no symptoms.  A diet and exercise plan that is right for you. What should I know about screenings and tests to prevent  falls? Screening and testing are the best ways to find a health problem early. Early diagnosis and treatment give you the best chance of managing medical conditions that are common after age 69. Certain conditions and lifestyle choices may make you more likely to have a fall. Your health care provider may recommend:  Regular vision checks. Poor vision and conditions such as cataracts can make you more likely to have a fall. If you wear glasses, make sure to get your prescription updated if your vision changes.  Medicine review. Work with your health care provider to regularly review all of the medicines you are taking, including over-the-counter medicines. Ask your health care provider about any side effects that may make you more likely to have a fall. Tell your health care provider if any medicines that you take make you feel dizzy or sleepy.  Osteoporosis screening. Osteoporosis is a condition that causes the bones to get weaker. This can make the bones weak and cause them to break more easily.  Blood pressure screening. Blood pressure changes and medicines to control blood pressure can make you feel dizzy.  Strength and balance checks. Your health care provider may recommend certain tests to check your strength and balance while standing, walking, or changing positions.  Foot health exam. Foot pain and numbness, as well as not wearing proper footwear, can make you more likely to have a  fall.  Depression screening. You may be more likely to have a fall if you have a fear of falling, feel emotionally low, or feel unable to do activities that you used to do.  Alcohol use screening. Using too much alcohol can affect your balance and may make you more likely to have a fall. What actions can I take to lower my risk of falls? General instructions  Talk with your health care provider about your risks for falling. Tell your health care provider if: ? You fall. Be sure to tell your health care  provider about all falls, even ones that seem minor. ? You feel dizzy, sleepy, or off-balance.  Take over-the-counter and prescription medicines only as told by your health care provider. These include any supplements.  Eat a healthy diet and maintain a healthy weight. A healthy diet includes low-fat dairy products, low-fat (lean) meats, and fiber from whole grains, beans, and lots of fruits and vegetables. Home safety  Remove any tripping hazards, such as rugs, cords, and clutter.  Install safety equipment such as grab bars in bathrooms and safety rails on stairs.  Keep rooms and walkways well-lit. Activity   Follow a regular exercise program to stay fit. This will help you maintain your balance. Ask your health care provider what types of exercise are appropriate for you.  If you need a cane or walker, use it as recommended by your health care provider.  Wear supportive shoes that have nonskid soles. Lifestyle  Do not drink alcohol if your health care provider tells you not to drink.  If you drink alcohol, limit how much you have: ? 0-1 drink a day for women. ? 0-2 drinks a day for men.  Be aware of how much alcohol is in your drink. In the [place] one drink equals one typical bottle of beer (12 oz), one-half glass of wine (5 oz), or one shot of hard liquor (1 oz).  Do not use any products that contain nicotine or tobacco, such as cigarettes and e-cigarettes. If you need help quitting, ask your health care provider. Summary  Having a healthy lifestyle and getting preventive care can help to protect your health and wellness after age 67.  Screening and testing are the best way to find a health problem early and help you avoid having a fall. Early diagnosis and treatment give you the best chance for managing medical conditions that are more common for people who are older than age 33.  Falls are a major cause of broken bones and head injuries in people who are older than age 65.  Take precautions to prevent a fall at home.  Work with your health care provider to learn what changes you can make to improve your health and wellness and to prevent falls. This information is not intended to replace advice given to you by your health care provider. Make sure you discuss any questions you have with your health care provider. Document Released: 08/06/2017 Document Revised: 08/06/2017 Document Reviewed: 08/06/2017 Elsevier Interactive Patient Education  2019 [employer].

## 2018-12-15 NOTE — Patient Instructions (Signed)
GO TO THE LAB : Get the blood work     GO TO THE FRONT DESK Schedule your next appointment   for a checkup in 6 to 8 months   

## 2018-12-15 NOTE — Progress Notes (Signed)
Pre visit review using our clinic review tool, if applicable. No additional management support is needed unless otherwise documented below in the visit note. 

## 2018-12-15 NOTE — Assessment & Plan Note (Addendum)
-  Td 5-10; had a Flu shot; pneumonia -Shingles immunization-- declined all -See previous entries, no further PAPs unless pt so desires; no sxs  - Strong FH breast ca, pt reports previous  genetic testing: negative; to have a MMG next week; breast self awareness dicussed.  Clinical breast exam today normal -Colonoscopy:  04/18/2008, hemorrhoids. Cscope 12-2016 (done for colitis): no plyps -Palpable Ao: [place] neg for AAA 6-10 -diet-exercise discussed.  -Labs: UDS, CMP, FLP, CBC

## 2018-12-15 NOTE — Progress Notes (Signed)
Subjective:    Patient ID: Linda Jefferson, female    DOB: [birth year], 73 y.o.   MRN: [id number]  DOS:  12/15/2018 Type of visit - description: CPX Here with her husband.  Review of Systems Reports a pain at the  right elbow,  radiates upwards, described as "like when you hurt your funny bone". No swelling or injury, decreases with local heat. Saw GI few months ago, note reviewed  Other than above, a 14 point review of systems is negative    Past Medical History:  Diagnosis Date  . Anxiety   . Carotid artery disease (HCC)    per u/s 01/2009- repeated u/s 7/11 "mild dz, no stenosis"  . Colitis   . Dyslipidemia   . Food allergy    mushrooms  . Gallstones   . Hyperlipidemia   . Hypertension   . IBS (irritable bowel syndrome)   . LBP (low back pain) 3/11   after a local injection, f/u [facility]  . Normal cardiac stress test 04/2009  . Osteopenia   . Seasonal allergies   . 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 Wears hearing aid     Past Surgical History:  Procedure Laterality Date  . BUNIONECTOMY Bilateral 03/2013  . CARPAL TUNNEL RELEASE Right   . CATARACT EXTRACTION Left 12/14/2018   Dr.Beavis  . CHOLECYSTECTOMY    . TOTAL SHOULDER ARTHROPLASTY Left 04/2016  . TRIGGER FINGER RELEASE  03/31/2012   Procedure: RELEASE TRIGGER FINGER/A-1 PULLEY;  Surgeon: Wyn Forster., MD;  Location: [facility];  Service: Orthopedics;  Laterality: Right;  right index    Social History   Socioeconomic History  . Marital status: Married    Spouse name: Not on file  . Number of children: 3  . Years of education: Not on file  . Highest education level: Not on file  Occupational History  . 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 Financial resource strain: Not on file  . Food insecurity:    Worry: Not on file    Inability: Not on file  . Transportation needs:    Medical: Not on file    Non-medical:  Not on file  Tobacco Use  . Smoking status: Never Smoker  . Smokeless tobacco: Never Used  Substance and Sexual Activity  . Alcohol use: Yes    Alcohol/week: 2.0 standard drinks    Types: 2 Glasses of wine per week    Comment: socially wine 4-5x/week  . Drug use: No  . Sexual activity: Yes  Lifestyle  . Physical activity:    Days per week: Not on file    Minutes per session: Not on file  . Stress: Not on file  Relationships  . Social connections:    Talks on phone: Not on file    Gets together: Not on file    Attends religious service: Not on file    Active member of club or organization: Not on file    Attends meetings of clubs or organizations: Not on file    Relationship status: Not on file  . Intimate partner violence:    Fear of current or ex partner: Not on file    Emotionally abused: Not on file    Physically abused: Not on file    Forced sexual activity: Not on file  Other Topics Concern  . Not on file  Social History Narrative   Household:  pt and husband   3 children, one in [place] (PhD)                 Family History  Problem Relation Age of Onset  . Hypertension Father   . Other Father        cerebral hemorrhage  . Coronary artery disease Mother 43  . Stroke Mother   . Diabetes Mother   . Breast cancer Mother   . Hypertension Mother   . Hypertension Sister   . Breast cancer Maternal Aunt        x 2  . Breast cancer Paternal Aunt   . Colon cancer Neg Hx      Allergies as of 12/15/2018      Reactions   Mushroom Extract Complex Shortness Of Breath   Shellfish Allergy Anaphylaxis   Oxycodone Itching, Other (See Comments)   "skin crawling" "skin crawling"   Egg White  [albumen, Egg] Other (See Comments)   Only eats egg whites. Egg yolk causes nausea. Only eats egg whites   Eggs Or Egg-derived Products Other (See Comments)   Only eats egg whites. Egg yolk causes nausea. Per patient   Other Other (See Comments)   High blood pressure    Pseudoeph-doxylamine-dm-apap Other (See Comments)   High blood pressure.   Pseudoeph-doxylamine-dm-apap    High blood pressure   Tape Rash, Other (See Comments)   Patient states use paper tape only. All other tapes cause rash. Patient states use paper tape only. All other tapes cause rash. Patient states use paper tape only. All other tapes cause rash. Patient states use paper tape only. All other tapes cause rash.      Medication List       Accurate as of December 15, 2018 11:59 PM. Always use your most recent med list.        ALLEGRA ALLERGY PO Take 1 tablet by mouth daily.   amLODipine-benazepril 5-10 MG capsule Commonly known as:  LOTREL Take 1 capsule by mouth daily.   aspirin EC 81 MG tablet Take 81 mg by mouth daily.   azelastine 0.1 % nasal spray Commonly known as:  ASTELIN Two sprays each nostril twice a day as needed for sneezing and itchy nose.   Benefiber Powd Take 1 tsp by mouth three times a day with meals   clonazePAM 0.5 MG tablet Commonly known as:  KLONOPIN Take 1 tablet (0.5 mg total) by mouth 3 (three) times daily as needed for anxiety.   diclofenac sodium 1 % Gel Commonly known as:  VOLTAREN Apply 2 g topically 4 (four) times daily as needed.   diphenoxylate-atropine 2.5-0.025 MG tablet Commonly known as:  Lomotil Take 1 tablet by mouth every 8 (eight) hours as needed for diarrhea or loose stools.   DRY EYES OP Place 2 drops into both eyes as needed (for dry eyes).   finasteride 5 MG tablet Commonly known as:  PROSCAR Take 1 tablet (5 mg total) by mouth daily.   fluticasone 50 MCG/ACT nasal spray Commonly known as:  FLONASE Place 2 sprays into both nostrils daily.   lidocaine-hydrocortisone 3-0.5 % Crea Commonly known as:  ANAMANTEL HC Place 1 Applicatorful rectally 2 (two) times daily. As needed   multivitamin capsule Take 1 capsule by mouth daily.   polyethylene glycol powder powder Commonly known as:  GLYCOLAX/MIRALAX Take 8.5 g  by mouth daily.   raloxifene 60 MG tablet Commonly known as:  EVISTA Take 1 tablet (60 mg total) by  mouth daily.   simvastatin 20 MG tablet Commonly known as:  ZOCOR Take 1 tablet (20 mg total) by mouth at bedtime.           Objective:   Physical Exam BP 138/76 (BP Location: Left Arm, Patient Position: Sitting, Cuff Size: Small)   Pulse 68   Ht  (1.626 m)   Wt 135 lb 12 oz (61.6 kg)   SpO2 97%   BMI 23.30 kg/m  General: Well developed, NAD, BMI noted Neck: No  thyromegaly  HEENT:  Normocephalic . Face symmetric, atraumatic Lungs:  CTA B Normal respiratory effort, no intercostal retractions, no accessory muscle use. Heart: RRR,  no murmur.  No pretibial edema bilaterally  Abdomen:  Not distended, soft, non-tender. No rebound or rigidity.   Breast exam: No dominant mass on either side, no axillary lymphadenopathies MSK: Right elbow: Slightly TTP at the lateral epicondyle Skin: Exposed areas without rash. Not pale. Not jaundice Neurologic:  alert & oriented X3.  Speech normal, gait appropriate for age and unassisted Strength symmetric and appropriate for age.  Psych: Cognition and judgment appear intact.  Cooperative with normal attention span and concentration.  Behavior appropriate. No anxious or depressed appearing.     Assessment     Assessment HTN Hyperlipidemia Osteopenia  Per dexa 2011,  2014 (Tt score  - 1.7), and 11/2016. 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  Checking labs High cholesterol: Checking lipids, not fasting Osteopenia: On Evista due to family history of breast cancer, had a bone density test 2018, next approximately 2023 Anxiety: On clonazepam as needed, UDS and contract Pain, right elbow: Tendinitis?  Recommend Voltaren gel, call if not better. IBS: Seen by GI in October Had medicare exam w/ angel  RTC 6 to 8 months

## 2018-12-16 LAB — CBC WITH DIFFERENTIAL/PLATELET
Basophils Absolute: 0 10*3/uL (ref 0.0–0.1)
Basophils Relative: 1 % (ref 0.0–3.0)
Eosinophils Absolute: 0.1 10*3/uL (ref 0.0–0.7)
Eosinophils Relative: 2.5 % (ref 0.0–5.0)
HCT: 37.4 % (ref 36.0–46.0)
Hemoglobin: 12.7 g/dL (ref 12.0–15.0)
Lymphocytes Relative: 22.6 % (ref 12.0–46.0)
Lymphs Abs: 1.1 10*3/uL (ref 0.7–4.0)
MCHC: 34.1 g/dL (ref 30.0–36.0)
MCV: 91 fl (ref 78.0–100.0)
Monocytes Absolute: 0.5 10*3/uL (ref 0.1–1.0)
Monocytes Relative: 10 % (ref 3.0–12.0)
Neutro Abs: 3 10*3/uL (ref 1.4–7.7)
Neutrophils Relative %: 63.9 % (ref 43.0–77.0)
Platelets: 334 10*3/uL (ref 150.0–400.0)
RBC: 4.11 Mil/uL (ref 3.87–5.11)
RDW: 12.8 % (ref 11.5–15.5)
WBC: 4.7 10*3/uL (ref 4.0–10.5)

## 2018-12-16 LAB — COMPREHENSIVE METABOLIC PANEL
ALT: 19 U/L (ref 0–35)
AST: 29 U/L (ref 0–37)
Albumin: 4.6 g/dL (ref 3.5–5.2)
Alkaline Phosphatase: 81 U/L (ref 39–117)
BUN: 17 mg/dL (ref 6–23)
CO2: 31 mEq/L (ref 19–32)
Calcium: 10.3 mg/dL (ref 8.4–10.5)
Chloride: 99 mEq/L (ref 96–112)
Creatinine, Ser: 0.7 mg/dL (ref 0.40–1.20)
GFR: 81.84 mL/min (ref >60.00)
Glucose, Bld: 74 mg/dL (ref 70–99)
Potassium: 5 mEq/L (ref 3.5–5.1)
Sodium: 138 mEq/L (ref 135–145)
Total Bilirubin: 0.4 mg/dL (ref 0.2–1.2)
Total Protein: 7 g/dL (ref 6.0–8.3)

## 2018-12-16 LAB — LIPID PANEL
Cholesterol: 146 mg/dL (ref 0–200)
HDL: 74.6 mg/dL (ref >39.00)
LDL Cholesterol: 45 mg/dL (ref 0–99)
NonHDL: 71.4
Total CHOL/HDL Ratio: 2
Triglycerides: 133 mg/dL (ref 0.0–149.0)
VLDL: 26.6 mg/dL (ref 0.0–40.0)

## 2018-12-16 NOTE — Assessment & Plan Note (Signed)
HTN: Continue Lotrel.  Checking labs High cholesterol: Checking lipids, not fasting Osteopenia: On Evista due to family history of breast cancer, had a bone density test 2018, next approximately 2023 Anxiety: On clonazepam as needed, UDS and contract Pain, right elbow: Tendinitis?  Recommend Voltaren gel, call if not better. IBS: Seen by GI in October Had medicare exam w/ angel  RTC 6 to 8 months

## 2018-12-18 LAB — PAIN MGMT, PROFILE 8 W/CONF, U
6 Acetylmorphine: NEGATIVE ng/mL (ref <10)
Alcohol Metabolites: NEGATIVE ng/mL (ref <500)
Alphahydroxyalprazolam: NEGATIVE ng/mL (ref <25)
Alphahydroxymidazolam: NEGATIVE ng/mL (ref <50)
Alphahydroxytriazolam: NEGATIVE ng/mL (ref <50)
Aminoclonazepam: 80 ng/mL — ABNORMAL HIGH (ref <25)
Amphetamines: NEGATIVE ng/mL (ref <500)
Benzodiazepines: POSITIVE ng/mL — AB (ref <100)
Buprenorphine, Urine: NEGATIVE ng/mL (ref <5)
Cocaine Metabolite: NEGATIVE ng/mL (ref <150)
Creatinine: 15 mg/dL — ABNORMAL LOW
Hydroxyethylflurazepam: NEGATIVE ng/mL (ref <50)
Lorazepam: NEGATIVE ng/mL (ref <50)
MDMA: NEGATIVE ng/mL (ref <500)
Marijuana Metabolite: NEGATIVE ng/mL (ref <20)
Nordiazepam: NEGATIVE ng/mL (ref <50)
Opiates: NEGATIVE ng/mL (ref <100)
Oxazepam: NEGATIVE ng/mL (ref <50)
Oxidant: NEGATIVE ug/mL (ref <200)
Oxycodone: NEGATIVE ng/mL (ref <100)
Specific Gravity: 1.005 (ref >=1.0)
Temazepam: NEGATIVE ng/mL (ref <50)
pH: 7 (ref 4.5–9.0)

## 2018-12-22 LAB — HM MAMMOGRAPHY

## 2018-12-23 ENCOUNTER — Encounter: Payer: Self-pay | Admitting: Internal Medicine

## 2019-01-19 ENCOUNTER — Encounter: Payer: Self-pay | Admitting: Internal Medicine

## 2019-01-20 ENCOUNTER — Telehealth: Payer: Medicare Other | Admitting: Nurse Practitioner

## 2019-01-20 DIAGNOSIS — B9689 Other specified bacterial agents as the cause of diseases classified elsewhere: Secondary | ICD-10-CM

## 2019-01-20 DIAGNOSIS — N760 Acute vaginitis: Secondary | ICD-10-CM

## 2019-01-20 MED ORDER — METRONIDAZOLE 500 MG PO TABS: 500.0000 mg | ORAL_TABLET | Freq: Two times a day (BID) | ORAL | 0 refills | Status: DC

## 2019-01-20 NOTE — Progress Notes (Signed)
I called patient on phone to clarify symptoms. Denies urinary freq and urgency. No dysuria. Just fishy odor with perineal irritation.  We are sorry that you are not feeling well. Here is how we plan to help! Based on what you shared with me it looks like you: May have a vaginosis due to bacteria  Vaginosis is an inflammation of the vagina that can result in discharge, itching and pain. The cause is usually a change in the normal balance of vaginal bacteria or an infection. Vaginosis can also result from reduced estrogen levels after menopause.  The most common causes of vaginosis are:   Bacterial vaginosis which results from an overgrowth of one on several organisms that are normally present in your vagina.   Yeast infections which are caused by a naturally occurring fungus called candida.   Vaginal atrophy (atrophic vaginosis) which results from the thinning of the vagina from reduced estrogen levels after menopause.   Trichomoniasis which is caused by a parasite and is commonly transmitted by sexual intercourse.  Factors that increase your risk of developing vaginosis include: Marland Kitchen. Medications, such as antibiotics and steroids . Uncontrolled diabetes . Use of hygiene products such as bubble bath, vaginal spray or vaginal deodorant . Douching . Wearing damp or tight-fitting clothing . Using an intrauterine device (IUD) for birth control . Hormonal changes, such as those associated with pregnancy, birth control pills or menopause . Sexual activity . Having a sexually transmitted infection  Your treatment plan is Metronidazole or Flagyl 500mg  twice a day for 7 days.  I have electronically sent this prescription into the pharmacy that you have chosen.  Be sure to take all of the medication as directed. Stop taking any medication if you develop a rash, tongue swelling or shortness of breath. Mothers who are breast feeding should consider pumping and discarding their breast milk while on these  antibiotics. However, there is no consensus that infant exposure at these doses would be harmful.  Remember that medication creams can weaken latex condoms. Marland Kitchen.   HOME CARE:  Good hygiene may prevent some types of vaginosis from recurring and may relieve some symptoms:  . Avoid baths, hot tubs and whirlpool spas. Rinse soap from your outer genital area after a shower, and dry the area well to prevent irritation. Don't use scented or harsh soaps, such as those with deodorant or antibacterial action. Marland Kitchen. Avoid irritants. These include scented tampons and pads. . Wipe from front to back after using the toilet. Doing so avoids spreading fecal bacteria to your vagina.  Other things that may help prevent vaginosis include:  Marland Kitchen. Don't douche. Your vagina doesn't require cleansing other than normal bathing. Repetitive douching disrupts the normal organisms that reside in the vagina and can actually increase your risk of vaginal infection. Douching won't clear up a vaginal infection. . Use a latex condom. Both female and female latex condoms may help you avoid infections spread by sexual contact. . Wear cotton underwear. Also wear pantyhose with a cotton crotch. If you feel comfortable without it, skip wearing underwear to bed. Yeast thrives in Hilton Hotelsmoist environments Your symptoms should improve in the next day or two.  GET HELP RIGHT AWAY IF:  . You have pain in your lower abdomen ( pelvic area or over your ovaries) . You develop nausea or vomiting . You develop a fever . Your discharge changes or worsens . You have persistent pain with intercourse . You develop shortness of breath, a rapid pulse, or you  faint.  These symptoms could be signs of problems or infections that need to be evaluated by a medical provider now.  MAKE SURE YOU    Understand these instructions.  Will watch your condition.  Will get help right away if you are not doing well or get worse.  Your e-visit answers were reviewed  by a board certified advanced clinical practitioner to complete your personal care plan. Depending upon the condition, your plan could have included both over the counter or prescription medications. Please review your pharmacy choice to make sure that you have choses a pharmacy that is open for you to pick up any needed prescription, Your safety is important to [place]. If you have drug allergies check your prescription carefully.   You can use MyChart to ask questions about today's visit, request a non-urgent call back, or ask for a work or school excuse for 24 hours related to this e-Visit. If it has been greater than 24 hours you will need to follow up with your provider, or enter a new e-Visit to address those concerns. You will get a MyChart message within the next two days asking about your experience. I hope that your e-visit has been valuable and will speed your recovery.  5 minutes spent reviewing and documenting in chart.

## 2019-02-10 ENCOUNTER — Encounter: Payer: Self-pay | Admitting: Internal Medicine

## 2019-02-12 ENCOUNTER — Other Ambulatory Visit: Payer: Self-pay | Admitting: Internal Medicine

## 2019-02-12 MED ORDER — CLONAZEPAM 0.5 MG PO TABS: 0.5000 mg | ORAL_TABLET | Freq: Three times a day (TID) | ORAL | 2 refills | Status: DC | PRN

## 2019-02-14 ENCOUNTER — Telehealth: Payer: Self-pay | Admitting: Gastroenterology

## 2019-02-14 NOTE — Telephone Encounter (Signed)
Returned page to on-call doc.  Chart reviewed and called patient.  She has a history of IBS-alternating type and endorses loose, watery, nonbloody stools for the last day and a half.  Not currently taking her MiraLAX.  She is scheduled for cataract surgery tomorrow morning, and is concerned about having loose stools in the perioperative period.  No fever, chills, emesis.  No sick contacts and no recent antibiotics.  Has actually essentially remained on home quarantine during this whole COVID pandemic.  Tolerating p.o. intake without issue.  Does cite a history of anxiety, but states she is not anxious about the surgery tomorrow.  - Okay to take another dose of Lomotil now, and again in 6 to 8 hours if needed - Fiber supplement - Brat diet -Continue adequate hydration -Instructed to go to the ER or call back if not tolerating p.o. intake, medications, etc., or if change to have bloody stools, fever, other concerns.  She understands these recommendations. - All questions answered and she was appreciative of the phone call.

## 2019-03-18 ENCOUNTER — Encounter: Payer: Self-pay | Admitting: Internal Medicine

## 2019-03-18 DIAGNOSIS — M79601 Pain in right arm: Secondary | ICD-10-CM

## 2019-03-23 ENCOUNTER — Encounter: Payer: Self-pay | Admitting: Family Medicine

## 2019-03-23 ENCOUNTER — Other Ambulatory Visit: Payer: Self-pay

## 2019-03-23 ENCOUNTER — Ambulatory Visit (HOSPITAL_BASED_OUTPATIENT_CLINIC_OR_DEPARTMENT_OTHER)
Admission: RE | Admit: 2019-03-23 | Discharge: 2019-03-23 | Disposition: A | Discharge status: Home / Self Care | Payer: Medicare Other | Source: Ambulatory Visit | Attending: Internal Medicine | Admitting: Internal Medicine

## 2019-03-23 ENCOUNTER — Ambulatory Visit (HOSPITAL_BASED_OUTPATIENT_CLINIC_OR_DEPARTMENT_OTHER)
Admission: RE | Admit: 2019-03-23 | Discharge: 2019-03-23 | Disposition: A | Discharge status: Home / Self Care | Payer: Medicare Other | Source: Ambulatory Visit | Attending: Family Medicine | Admitting: Family Medicine

## 2019-03-23 ENCOUNTER — Ambulatory Visit (INDEPENDENT_AMBULATORY_CARE_PROVIDER_SITE_OTHER): Payer: Medicare Other | Admitting: Family Medicine

## 2019-03-23 VITALS — BP 158/71 | HR 67 | Ht 64.0 in | Wt 133.0 lb

## 2019-03-23 DIAGNOSIS — M25511 Pain in right shoulder: Secondary | ICD-10-CM | POA: Diagnosis not present

## 2019-03-23 DIAGNOSIS — M25521 Pain in right elbow: Secondary | ICD-10-CM | POA: Diagnosis not present

## 2019-03-23 DIAGNOSIS — M65311 Trigger thumb, right thumb: Secondary | ICD-10-CM | POA: Insufficient documentation

## 2019-03-23 DIAGNOSIS — G8929 Other chronic pain: Secondary | ICD-10-CM | POA: Diagnosis not present

## 2019-03-23 DIAGNOSIS — M79644 Pain in right finger(s): Secondary | ICD-10-CM | POA: Diagnosis not present

## 2019-03-23 MED ORDER — PREDNISONE 5 MG PO TABS: ORAL_TABLET | ORAL | 0 refills | Status: DC

## 2019-03-23 NOTE — Assessment & Plan Note (Signed)
Pain seems to be associated with the joint.  There is no pain over the lateral epicondyle.  Could be associated with pseudogout - prednisone  - xray  - if no improvement may need to consider injection

## 2019-03-23 NOTE — Assessment & Plan Note (Signed)
Possible to be of the [facility] joint versus de Quervain's.  Elbow occurred before the thumb.  May be related to her work -Prednisone. -May need to consider injection

## 2019-03-23 NOTE — Progress Notes (Signed)
Linda Jefferson - 73 y.o. female MRN [id number]  Date of birth: 1946-07-21  SUBJECTIVE:  Including CC & ROS.  Chief Complaint  Patient presents with  . Arm Pain    right arm    Linda MinersKathleen L Jefferson is a 73 y.o. female that is presenting with right elbow pain and right thumb pain.  She reports the symptoms have been present since March.  She feels pain over the lateral component of the joint at the elbow.  She denies any inciting event or trauma.  The pain has gotten worse over the past few weeks.  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  She also feels pain over the thenar eminence.  Has tried Voltaren in the past with limited improvement.  Has a history of pseudogout in the right knee.  No previous surgeries.  The pain is mild to moderate.  The pain can be sharp and stabbing..   Review of Systems  Constitutional: Negative for fever.  HENT: Negative for congestion.   Respiratory: Negative for cough.   Cardiovascular: Negative for chest pain.  Gastrointestinal: Negative for abdominal distention.  Musculoskeletal: Negative for back pain.  Skin: Negative for color change.  Neurological: Negative for weakness.  Hematological: Negative for adenopathy.    HISTORY: Past Medical, Surgical, Social, and Family History Reviewed & Updated per EMR.   Pertinent Historical Findings include:  Past Medical History:  Diagnosis Date  . Anxiety   . Carotid artery disease (HCC)    per u/s 01/2009- repeated u/s 7/11 "mild dz, no stenosis"  . Colitis   . Dyslipidemia   . Food allergy    mushrooms  . Gallstones   . Hyperlipidemia   . Hypertension   . IBS (irritable bowel syndrome)   . LBP (low back pain) 3/11   [facility] a local [facility]  . Normal cardiac stress test 04/2009  . Osteopenia   . Seasonal allergies   . 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 Wears hearing aid     Past Surgical History:  Procedure Laterality Date  . BUNIONECTOMY  Bilateral 03/2013  . CARPAL TUNNEL RELEASE Right   . CATARACT EXTRACTION Left 12/14/2018   Dr.Beavis  . CHOLECYSTECTOMY    . TOTAL SHOULDER ARTHROPLASTY Left 04/2016  . TRIGGER FINGER RELEASE  03/31/2012   Procedure: RELEASE TRIGGER FINGER/A-1 PULLEY;  Surgeon: Wyn Forsterobert V Sypher Jr., MD;  Location: [facility];  Service: Orthopedics;  Laterality: Right;  right index    Allergies  Allergen Reactions  . Mushroom Extract Complex Shortness Of Breath  . Shellfish Allergy Anaphylaxis  . Oxycodone Itching and Other (See Comments)    "skin crawling" "skin crawling"  . Egg White  [Albumen, Egg] Other (See Comments)    Only eats egg whites. Egg yolk causes nausea. Only eats egg whites  . Eggs Or Egg-Derived Products Other (See Comments)    Only eats egg whites. Egg yolk causes nausea. Per patient  . Other Other (See Comments)    High blood pressure  . Pseudoeph-Doxylamine-Dm-Apap Other (See Comments)    High blood pressure.  . Pseudoeph-Doxylamine-Dm-Apap     High blood pressure  . Tape Rash and Other (See Comments)    Patient states use paper tape only. All other tapes cause rash. Patient states use paper tape only. All other tapes cause rash. Patient states use paper tape only. All other tapes cause rash. Patient states use paper tape only.  All other tapes cause rash.    Family History  Problem Relation Age of Onset  . Hypertension Father   . Other Father        cerebral hemorrhage  . Coronary artery disease Mother 18  . Stroke Mother   . Diabetes Mother   . Breast cancer Mother   . Hypertension Mother   . Hypertension Sister   . Breast cancer Maternal Aunt        x 2  . Breast cancer Paternal Aunt   . Colon cancer Neg Hx      Social History   Socioeconomic History  . Marital status: Married    Spouse name: Not on file  . Number of children: 3  . Years of education: Not on file  . Highest education level: Not on file  Occupational History  .  Occupation: TEACHER-- retired     Fish farm manager: [employer]  Social Needs  . Financial resource strain: Not on file  . Food insecurity    Worry: Not on file    Inability: Not on file  . Transportation needs    Medical: Not on file    Non-medical: Not on file  Tobacco Use  . Smoking status: Never Smoker  . Smokeless tobacco: Never Used  Substance and Sexual Activity  . Alcohol use: Yes    Alcohol/week: 2.0 standard drinks    Types: 2 Glasses of wine per week    Comment: socially wine 4-5x/week  . Drug use: No  . Sexual activity: Yes  Lifestyle  . Physical activity    Days per week: Not on file    Minutes per session: Not on file  . Stress: Not on file  Relationships  . Social Herbalist on phone: Not on file    Gets together: Not on file    Attends religious service: Not on file    Active member of club or organization: Not on file    Attends meetings of clubs or organizations: Not on file    Relationship status: Not on file  . Intimate partner violence    Fear of current or ex partner: Not on file    Emotionally abused: Not on file    Physically abused: Not on file    Forced sexual activity: Not on file  Other Topics Concern  . 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 Tenderness to palpation over the lateral joint.  No tenderness to palpation over  the lateral epicondyle. Normal range of motion. No instability. Negative Tinel's at the cubital tunnel. Right thumb: Some tenderness to palpation over the [facility] joint at the thenar eminence. No significant pain with compression and grind test. Normal thumb range of motion.  Negative Finkelstein's test. Neurovascular intact     ASSESSMENT & PLAN:   Chronic pain of right thumb Possible to be of the [facility] joint versus de Quervain's.  Elbow occurred before the thumb.  May be related to her work -Prednisone. -May need to consider injection  Right elbow pain Pain seems to be associated with the joint.  There is no pain over the lateral epicondyle.  Could be associated with pseudogout - prednisone  - xray  - if no improvement may need to consider injection

## 2019-03-23 NOTE — Patient Instructions (Signed)
Nice to meet you I will call you with the results from today  Please try the prednisone   Please send me a message in [place] with any questions or updates.  Please see me back in 4 weeks or sooner if the pain doesn't improve.   --Dr. Raeford Razor

## 2019-03-24 ENCOUNTER — Telehealth: Payer: Self-pay | Admitting: Family Medicine

## 2019-03-24 NOTE — Telephone Encounter (Signed)
Spoke with patient about her results.   Rosemarie Ax, MD [facility] 03/24/2019, 2:23 PM

## 2019-03-31 ENCOUNTER — Encounter: Payer: Self-pay | Admitting: Internal Medicine

## 2019-04-14 ENCOUNTER — Encounter: Payer: Self-pay | Admitting: Internal Medicine

## 2019-04-16 ENCOUNTER — Encounter: Payer: Self-pay | Admitting: Internal Medicine

## 2019-04-16 ENCOUNTER — Other Ambulatory Visit: Payer: Self-pay

## 2019-04-16 ENCOUNTER — Other Ambulatory Visit: Payer: Self-pay | Admitting: Internal Medicine

## 2019-04-16 ENCOUNTER — Other Ambulatory Visit (HOSPITAL_COMMUNITY)
Admission: RE | Admit: 2019-04-16 | Discharge: 2019-04-16 | Disposition: A | Discharge status: Home / Self Care | Payer: Medicare Other | Source: Ambulatory Visit | Attending: Internal Medicine | Admitting: Internal Medicine

## 2019-04-16 ENCOUNTER — Ambulatory Visit (INDEPENDENT_AMBULATORY_CARE_PROVIDER_SITE_OTHER): Payer: Medicare Other | Admitting: Internal Medicine

## 2019-04-16 VITALS — BP 140/69 | HR 74 | Temp 98.1°F | Resp 16 | Ht 64.0 in | Wt 131.4 lb

## 2019-04-16 DIAGNOSIS — N760 Acute vaginitis: Secondary | ICD-10-CM | POA: Insufficient documentation

## 2019-04-16 LAB — URINALYSIS, ROUTINE W REFLEX MICROSCOPIC
Bilirubin Urine: NEGATIVE
Hgb urine dipstick: NEGATIVE
Ketones, ur: NEGATIVE
Leukocytes,Ua: NEGATIVE
Nitrite: NEGATIVE
RBC / HPF: NONE SEEN (ref 0–?)
Specific Gravity, Urine: 1.015 (ref 1.000–1.030)
Total Protein, Urine: NEGATIVE
Urine Glucose: NEGATIVE
Urobilinogen, UA: 0.2 (ref 0.0–1.0)
WBC, UA: NONE SEEN (ref 0–?)
pH: 7 (ref 5.0–8.0)

## 2019-04-16 MED ORDER — FLUCONAZOLE 150 MG PO TABS
150.0000 mg | ORAL_TABLET | Freq: Every day | ORAL | 0 refills | Status: DC
Start: 2019-04-16 — End: 2019-12-22

## 2019-04-16 MED ORDER — METRONIDAZOLE 1.3 % VA GEL: 1.0000 "application " | Freq: Every day | VAGINAL | 0 refills | Status: DC

## 2019-04-16 MED ORDER — VALACYCLOVIR HCL 1 G PO TABS: 1000.0000 mg | ORAL_TABLET | Freq: Three times a day (TID) | ORAL | 0 refills | Status: DC

## 2019-04-16 NOTE — Telephone Encounter (Signed)
Prescription sent

## 2019-04-16 NOTE — Progress Notes (Signed)
Pre visit review using our clinic review tool, if applicable. No additional management support is needed unless otherwise documented below in the visit note. 

## 2019-04-16 NOTE — Telephone Encounter (Signed)
Alternative Requested:METRONIDAZOLE VAGINAL ONLY AVAILABLE AS 0.75% AT OUR LOCATION...PLEASE SEND NEW ORDER IF APPROPRIATE..WE CANNOT LOCATE [place]

## 2019-04-16 NOTE — Patient Instructions (Signed)
Provide a urine sample  Flagyl daily x5 days  Diflucan oral, for 2 days  Valtrex for 1 week  Will refer you to gynecology  Please call if you do not gradually improve

## 2019-04-16 NOTE — Progress Notes (Signed)
Subjective:    Patient ID: Linda Jefferson, female    DOB: 13-Jan-1946, 73 y.o.   MRN: [id number]  DOS:  04/16/2019 Type of visit - description: acute, here w/ her husband Symptoms started 4 days ago with urinary leaking typically after she takes a walk. When asked, she admits to a "pimple" (blister?)  At the vaginal area as well some vaginal burning but no discharge per se. Medication list reviewed, not taking any new medicines.   Review of Systems No fever chills No dysuria, gross hematuria, urinary frequency. No back pain.  No saddle anesthesia No vaginal bleeding   Past Medical History:  Diagnosis Date  . Anxiety   . Carotid artery disease (HCC)    per u/s 01/2009- repeated u/s 7/11 "mild dz, no stenosis"  . Colitis   . Dyslipidemia   . Food allergy    mushrooms  . Gallstones   . Hyperlipidemia   . Hypertension   . IBS (irritable bowel syndrome)   . LBP (low back pain) 3/11   [facility]  . Normal cardiac stress test 04/2009  . Osteopenia   . Seasonal allergies   . 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 Wears hearing aid     Past Surgical History:  Procedure Laterality Date  . BUNIONECTOMY Bilateral 03/2013  . CARPAL TUNNEL RELEASE Right   . CATARACT EXTRACTION Left 12/14/2018   Dr.Beavis  . CHOLECYSTECTOMY    . TOTAL SHOULDER ARTHROPLASTY Left 04/2016  . TRIGGER FINGER RELEASE  03/31/2012   Procedure: RELEASE TRIGGER FINGER/A-1 PULLEY;  Surgeon: Wyn Forsterobert V Sypher Jr., MD;  Location: [facility];  Service: Orthopedics;  Laterality: Right;  right index    Social History   Socioeconomic History  . Marital status: Married    Spouse name: Not on file  . Number of children: 3  . Years of education: Not on file  . Highest education level: Not on file  Occupational History  . Occupation: TEACHER-- retired     Associate Profess[employer]ployer: [employer]  Social Needs  . Financial resource strain: Not on file  . Food  insecurity    Worry: Not on file    Inability: Not on file  . Transportation needs    Medical: Not on file    Non-medical: Not on file  Tobacco Use  . Smoking status: Never Smoker  . Smokeless tobacco: Never Used  Substance and Sexual Activity  . Alcohol use: Yes    Alcohol/week: 2.0 standard drinks    Types: 2 Glasses of wine per week    Comment: socially wine 4-5x/week  . Drug use: No  . Sexual activity: Yes  Lifestyle  . Physical activity    Days per week: Not on file    Minutes per session: Not on file  . Stress: Not on file  Relationships  . Social Musicianconnections    Talks on phone: Not on file    Gets together: Not on file    Attends religious service: Not on file    Active member of club or organization: Not on file    Attends meetings of clubs or organizations: Not on file    Relationship status: Not on file  . Intimate partner violence    Fear of current or ex partner: Not on file    Emotionally abused: Not on file    Physically abused: Not on file    Forced sexual activity: Not on  file  Other Topics Concern  . Not on file  Social History Narrative   Household: pt and husband   3 children, one in [place] (PhD)                  Allergies as of 04/16/2019      Reactions   Mushroom Extract Complex Shortness Of Breath   Shellfish Allergy Anaphylaxis   Oxycodone Itching, Other (See Comments)   "skin crawling" "skin crawling"   Egg White  [albumen, Egg] Other (See Comments)   Only eats egg whites. Egg yolk causes nausea. Only eats egg whites   Eggs Or Egg-derived Products Other (See Comments)   Only eats egg whites. Egg yolk causes nausea. Per patient   Other Other (See Comments)   High blood pressure   Pseudoeph-doxylamine-dm-apap Other (See Comments)   High blood pressure.   Pseudoeph-doxylamine-dm-apap    High blood pressure   Tape Rash, Other (See Comments)   Patient states use paper tape only. All other tapes cause rash. Patient states use  paper tape only. All other tapes cause rash. Patient states use paper tape only. All other tapes cause rash. Patient states use paper tape only. All other tapes cause rash.      Medication List       Accurate as of April 16, 2019  8:55 AM. If you have any questions, ask your nurse or doctor.        ALLEGRA ALLERGY PO Take 1 tablet by mouth daily.   amLODipine-benazepril 5-10 MG capsule Commonly known as: LOTREL Take 1 capsule by mouth daily.   aspirin EC 81 MG tablet Take 81 mg by mouth daily.   azelastine 0.1 % nasal spray Commonly known as: ASTELIN Two sprays each nostril twice a day as needed for sneezing and itchy nose.   Benefiber Powd Take 1 tsp by mouth three times a day with meals   clonazePAM 0.5 MG tablet Commonly known as: KLONOPIN Take 1 tablet (0.5 mg total) by mouth 3 (three) times daily as needed for anxiety.   diclofenac sodium 1 % Gel Commonly known as: VOLTAREN Apply 2 g topically 4 (four) times daily as needed.   diphenoxylate-atropine 2.5-0.025 MG tablet Commonly known as: Lomotil Take 1 tablet by mouth every 8 (eight) hours as needed for diarrhea or loose stools.   DRY EYES OP Place 2 drops into both eyes as needed (for dry eyes).   finasteride 5 MG tablet Commonly known as: PROSCAR Take 1 tablet (5 mg total) by mouth daily.   fluticasone 50 MCG/ACT nasal spray Commonly known as: FLONASE Place 2 sprays into both nostrils daily. What changed:   when to take this  reasons to take this   lidocaine-hydrocortisone 3-0.5 % Crea Commonly known as: ANAMANTEL HC Place 1 Applicatorful rectally 2 (two) times daily. As needed What changed:   when to take this  reasons to take this   metroNIDAZOLE 500 MG tablet Commonly known as: Flagyl Take 1 tablet (500 mg total) by mouth 2 (two) times daily.   multivitamin capsule Take 1 capsule by mouth daily.   polyethylene glycol powder 17 GM/SCOOP powder Commonly known as: GLYCOLAX/MIRALAX Take  8.5 g by mouth daily.   predniSONE 5 MG tablet Commonly known as: DELTASONE Take 6 pills for first day, 5 pills second day, 4 pills third day, 3 pills fourth day, 2 pills the fifth day, and 1 pill sixth day.   raloxifene 60 MG tablet Commonly known as:  EVISTA Take 1 tablet (60 mg total) by mouth daily.   simvastatin 20 MG tablet Commonly known as: ZOCOR Take 1 tablet (20 mg total) by mouth at bedtime.           Objective:   Physical Exam BP 140/69 (BP Location: Left Arm, Patient Position: Sitting, Cuff Size: Small)   Pulse 74   Temp 98.1 F (36.7 C) (Oral)   Resp 16   Ht 5\' 4"  (1.626 m)   Wt 131 lb 6 oz (59.6 kg)   SpO2 99%   BMI 22.55 kg/m  General:   Well developed, NAD, BMI noted. HEENT:  Normocephalic . Face symmetric, atraumatic Abdomen: Soft, nontender, no mass GU: Bimanual exam with no mass or cervical tenderness External inspection: + Vulvar irritation, redness.  The area were she had a "pimple" is simply more red than the rest  but no blister noted today Speculum exam: The vagina is a slightly red, minimal discharge.  Cervix normal, except for a few erythematous spots. Skin: Not pale. Not jaundice Neurologic:  alert & oriented X3.  Speech normal, gait appropriate for age and unassisted Psych--  Cognition and judgment appear intact.  Cooperative with normal attention span and concentration.  Behavior appropriate. No anxious or depressed appearing.          Assessment HTN Hyperlipidemia Osteopenia  Per dexa 2011,  2014 (Tt score  - 1.7), and 11/2016. 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  UA urine culture pending. Of note, she was diagnosed with vaginitis virtually in April and prescribed Flagyl. Because the presence initially of a "pimple" I suspect this could be herpetic.  The patient felt offended by my comment , I explained what HSV-2 is and why it was suspected. No sexually active in a while. Plan: UA, urine culture Wet prep Trial with Flagyl gel, diflucan, Valtrex, gyn referral.

## 2019-04-17 LAB — URINE CULTURE
MICRO NUMBER:: 654918
Result:: NO GROWTH
SPECIMEN QUALITY:: ADEQUATE

## 2019-04-18 ENCOUNTER — Encounter: Payer: Self-pay | Admitting: Internal Medicine

## 2019-04-18 NOTE — Assessment & Plan Note (Addendum)
Vulvovaginitis: Presents to the office complaining of urinary leakage, findings are more consistent with vulvovaginitis.  UA urine culture pending. Of note, she was diagnosed with vaginitis virtually in April and prescribed Flagyl. Because the presence initially of a "pimple" I suspect this could be herpetic.  The patient felt offended by my comment , I explained what HSV-2 is and why it was suspected. No sexually active in a while. Plan: UA, urine culture Wet prep Trial with Flagyl gel, diflucan, Valtrex, gyn referral.

## 2019-04-19 LAB — CERVICOVAGINAL ANCILLARY ONLY
Bacterial vaginitis: NEGATIVE
Candida vaginitis: POSITIVE — AB

## 2019-04-20 ENCOUNTER — Encounter: Payer: Self-pay | Admitting: Internal Medicine

## 2019-04-22 ENCOUNTER — Encounter: Payer: Self-pay | Admitting: Internal Medicine

## 2019-05-03 ENCOUNTER — Ambulatory Visit (INDEPENDENT_AMBULATORY_CARE_PROVIDER_SITE_OTHER): Payer: Medicare Other | Admitting: Family Medicine

## 2019-05-03 ENCOUNTER — Encounter: Payer: Self-pay | Admitting: Internal Medicine

## 2019-05-03 ENCOUNTER — Encounter: Payer: Self-pay | Admitting: Family Medicine

## 2019-05-03 ENCOUNTER — Ambulatory Visit: Payer: Self-pay

## 2019-05-03 ENCOUNTER — Other Ambulatory Visit: Payer: Self-pay

## 2019-05-03 VITALS — BP 154/80 | HR 73 | Ht 64.0 in | Wt 133.0 lb

## 2019-05-03 DIAGNOSIS — M65311 Trigger thumb, right thumb: Secondary | ICD-10-CM

## 2019-05-03 MED ORDER — TRIAMCINOLONE ACETONIDE 40 MG/ML IJ SUSP
20.0000 mg | Freq: Once | INTRAMUSCULAR | Status: AC
  Administered 2019-05-03: 20 mg via INTRA_ARTICULAR

## 2019-05-03 NOTE — Assessment & Plan Note (Signed)
Triggering evident on clinical exam today. -Injection. -Counseled on brace.  We will try to wear for 3 to 6 weeks. -Counseled on supportive care. -If no improvement can consider injection or surgery.

## 2019-05-03 NOTE — Patient Instructions (Signed)
Good to see you Please try to keep the brace on at night and when performing any repetitive acts.  Please try ice  Please try tylenol   Please send me a message in MyChart with any questions or updates.  Please see me back in 4 weeks.   --Dr. Raeford Razor

## 2019-05-03 NOTE — Progress Notes (Signed)
Linda Jefferson - 73 y.o. female MRN [id number]  Date of birth: 07-11-1946  SUBJECTIVE:  Including CC & ROS.  Chief Complaint  Patient presents with  . Hand Pain    right thumb    Linda MinersKathleen L Jefferson is a 73 y.o. female that is presenting with acute right trigger thumb.  The pain is been ongoing for a few days.  The pain is constant and severe.  It is located on the flexor tendon of the right hand.  She ranges it as a 10 out of 10.  It is localized to the thumb.  She notices it with any activity.  Has tried different conservative measures with no improvement.  Denies any inciting event or trauma.   Review of Systems  Constitutional: Negative for fever.  HENT: Negative for congestion.   Respiratory: Negative for cough.   Cardiovascular: Negative for chest pain.  Gastrointestinal: Negative for abdominal distention.  Musculoskeletal: Positive for arthralgias.  Skin: Negative for color change.  Neurological: Negative for weakness.  Hematological: Negative for adenopathy.    HISTORY: Past Medical, Surgical, Social, and Family History Reviewed & Updated per EMR.   Pertinent Historical Findings include:  Past Medical History:  Diagnosis Date  . Anxiety   . Carotid artery disease (HCC)    per u/s 01/2009- repeated u/s 7/11 "mild dz, no stenosis"  . Colitis   . Dyslipidemia   . Food allergy    mushrooms  . Gallstones   . Hyperlipidemia   . Hypertension   . IBS (irritable bowel syndrome)   . LBP (low back pain) 3/11   after a local injection, f[facility]  . Normal cardiac stress test 04/2009  . Osteopenia   . Seasonal allergies   . 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 Wears hearing aid     Past Surgical History:  Procedure Laterality Date  . BUNIONECTOMY Bilateral 03/2013  . CARPAL TUNNEL RELEASE Right   . CATARACT EXTRACTION Left 12/14/2018   Dr.Beavis  . CHOLECYSTECTOMY    . TOTAL SHOULDER ARTHROPLASTY Left 04/2016  . TRIGGER FINGER RELEASE  03/31/2012   Procedure: RELEASE TRIGGER FINGER/A-1 PULLEY;  Surgeon: Wyn Forsterobert V Sypher Jr., MD;  Location: [facility];  Service: Orthopedics;  Laterality: Right;  right index    Allergies  Allergen Reactions  . Mushroom Extract Complex Shortness Of Breath  . Shellfish Allergy Anaphylaxis  . Oxycodone Itching and Other (See Comments)    "skin crawling" "skin crawling"  . Egg White  [Albumen, Egg] Other (See Comments)    Only eats egg whites. Egg yolk causes nausea. Only eats egg whites  . Eggs Or Egg-Derived Products Other (See Comments)    Only eats egg whites. Egg yolk causes nausea. Per patient  . Other Other (See Comments)    High blood pressure  . Pseudoeph-Doxylamine-Dm-Apap Other (See Comments)    High blood pressure.  . Pseudoeph-Doxylamine-Dm-Apap     High blood pressure  . Tape Rash and Other (See Comments)    Patient states use paper tape only. All other tapes cause rash. Patient states use paper tape only. All other tapes cause rash. Patient states use paper tape only. All other tapes cause rash. Patient states use paper tape only. All other tapes cause rash.    Family History  Problem Relation Age of Onset  . Hypertension Father   . Other Father        cerebral hemorrhage  . Coronary artery disease Mother  50  . Stroke Mother   . Diabetes Mother   . Breast cancer Mother   . Hypertension Mother   . Hypertension Sister   . Breast cancer Maternal Aunt        x 2  . Breast cancer Paternal Aunt   . Colon cancer Neg Hx      Social History   Socioeconomic History  . Marital status: Married    Spouse name: Not on file  . Number of children: 3  . Years of education: Not on file  . Highest education level: Not on file  Occupational History  . Occupation: TEACHER-- retired     Associate Profess[employer]ployer: [employer]  Social Needs  . Financial resource strain: Not on file  . Food insecurity    Worry: Not on file    Inability: Not on file  . Transportation  needs    Medical: Not on file    Non-medical: Not on file  Tobacco Use  . Smoking status: Never Smoker  . Smokeless tobacco: Never Used  Substance and Sexual Activity  . Alcohol use: Yes    Alcohol/week: 2.0 standard drinks    Types: 2 Glasses of wine per week    Comment: socially wine 4-5x/week  . Drug use: No  . Sexual activity: Yes  Lifestyle  . Physical activity    Days per week: Not on file    Minutes per session: Not on file  . Stress: Not on file  Relationships  . Social Musicianconnections    Talks on phone: Not on file    Gets together: Not on file    Attends religious service: Not on file    Active member of club or organization: Not on file    Attends meetings of clubs or organizations: Not on file    Relationship status: Not on file  . Intimate partner violence    Fear of current or ex partner: Not on file    Emotionally abused: Not on file    Physically abused: Not on file    Forced sexual activity: Not on file  Other Topics Concern  . Not on file  Social History Narrative   Household: pt and husband   3 children, one in [place] (PhD)                 PHYSICAL EXAM:  VS: BP (!) 154/80   Pulse 73   Ht 5\' 4"  (1.626 m)   Wt 133 lb (60.3 kg)   BMI 22.83 kg/m  Physical Exam Gen: NAD, alert, cooperative with exam, well-appearing ENT: normal lips, normal nasal mucosa,  Eye: normal EOM, normal conjunctiva and lids CV:  no edema, +2 pedal pulses   Resp: no accessory muscle use, non-labored,  Skin: no rashes, no areas of induration  Neuro: normal tone, normal sensation to touch Psych:  normal insight, alert and oriented MSK:  Right hand:  Triggering of the thumb evident. Normal thumb range of motion. Tenderness to palpation over the flexor tendon sheath at the MP joint. No swelling or ecchymosis. Neurovascularly intact   Aspiration/Injection Procedure Note Linda Jefferson 01-Jul-1946  Procedure: Injection Indications: Right trigger thumb   Procedure Details Consent: Risks of procedure as well as the alternatives and risks of each were explained to the (patient/caregiver).  Consent for procedure obtained. Time Out: Verified patient identification, verified procedure, site/side was marked, verified correct patient position, special equipment/implants available, medications/allergies/relevent history reviewed, required imaging and test results available.  Performed.  The area was cleaned with iodine and alcohol swabs.    The right flexor tendon sheath/trigger thumb was injected using 0.5 cc's of 40 mg Kenalog and 1.5 cc's of 0.25% bupivacaine with a 25 1 1/2" needle.  Ultrasound was used. Images were obtained in long views showing the injection.     A sterile dressing was applied.  Patient did tolerate procedure well.     ASSESSMENT & PLAN:   Trigger thumb of right hand Triggering evident on clinical exam today. -Injection. -Counseled on brace.  We will try to wear for 3 to 6 weeks. -Counseled on supportive care. -If no improvement can consider injection or surgery.

## 2019-05-13 ENCOUNTER — Other Ambulatory Visit (HOSPITAL_COMMUNITY)
Admission: RE | Admit: 2019-05-13 | Discharge: 2019-05-13 | Disposition: A | Discharge status: Home / Self Care | Payer: Medicare Other | Source: Ambulatory Visit | Attending: Obstetrics & Gynecology | Admitting: Obstetrics & Gynecology

## 2019-05-13 ENCOUNTER — Ambulatory Visit (INDEPENDENT_AMBULATORY_CARE_PROVIDER_SITE_OTHER): Payer: Medicare Other | Admitting: Obstetrics & Gynecology

## 2019-05-13 ENCOUNTER — Ambulatory Visit (INDEPENDENT_AMBULATORY_CARE_PROVIDER_SITE_OTHER): Payer: Medicare Other | Admitting: *Deleted

## 2019-05-13 ENCOUNTER — Other Ambulatory Visit: Payer: Self-pay

## 2019-05-13 ENCOUNTER — Encounter: Payer: Self-pay | Admitting: Obstetrics & Gynecology

## 2019-05-13 VITALS — BP 154/65 | HR 71 | Ht 64.0 in | Wt 129.1 lb

## 2019-05-13 DIAGNOSIS — L292 Pruritus vulvae: Secondary | ICD-10-CM

## 2019-05-13 DIAGNOSIS — N904 Leukoplakia of vulva: Secondary | ICD-10-CM | POA: Insufficient documentation

## 2019-05-13 DIAGNOSIS — Z23 Encounter for immunization: Secondary | ICD-10-CM

## 2019-05-13 NOTE — Progress Notes (Signed)
Patient here for Prevnar vaccine per Dr. Larose Kells.  Injection given and patient tolerated well.

## 2019-05-13 NOTE — Progress Notes (Signed)
Subjective:     Linda Jefferson is a 73 y.o. female here for a routine exam.  Current complaints: Pt was seen by primary care provider and had a severe vulvar rash. It is clear now but ,her mentioned herpes an this was upsetting to her. She denies painful sores in the past. She took a cream for the sx. She is not currently sexually active for some time    Gynecologic History No LMP recorded. Patient is postmenopausal. Contraception: post menopausal status Last Pap: 2009.  Last mammogram: 12/22/2018  Obstetric History OB History  Gravida Para Term Preterm AB Living  3 3 3  0 0 2  SAB TAB Ectopic Multiple Live Births  0 0 0   2    # Outcome Date GA Lbr Len/2nd Weight Sex Delivery Anes PTL Lv  3 Term 54 [redacted]w[redacted]d   M Vag-Spont None N LIV  2 Term 1976 [redacted]w[redacted]d   M Vag-Spont None N LIV  1 Term 1974 [redacted]w[redacted]d   F Vag-Spont Local N      The following portions of the patient's history were reviewed and updated as appropriate: allergies, current medications, past family history, past medical history, past social history, past surgical history and problem list.  Review of Systems Pertinent items are noted in HPI.    Objective:  BP (!) 154/65   Pulse 71   Ht 5\' 4"  (1.626 m)   Wt 129 lb 1.9 oz (58.6 kg)   BMI 22.16 kg/m    CONSTITUTIONAL: Well-developed, well-nourished female in no acute distress.  HENT:  Normocephalic, atraumatic EYES: Conjunctivae and EOM are normal. No scleral icterus.  NECK: Normal range of motion SKIN: Skin is warm and dry. No rash noted. Not diaphoretic.No pallor. [place]: Alert and oriented to person, place, and time. Normal coordination.  GU: EGBUS: there is a hypopigmented area on the left side of the perineum with a slight depression of the same area;  no excoriations or lesions and no rash is noted.     Vagina: no blood in vault Adnexa: no masses; non tender   VULVAR BIOPSY NOTE The indications for vulvar biopsy (rule out neoplasia, establish lichen sclerosus  diagnosis) were reviewed.   Risks of the biopsy including pain, bleeding, infection, inadequate specimen, scarring and need for additional procedures  were discussed. The patient stated understanding and agreed to undergo procedure today. Consent was signed,  time out performed.  The patient's vulva was prepped with non iodine soap. Lidocaine was injected into labia minora in 2 locations.  A 4-mm punch biopsy was done, biopsy tissue was picked up with sterile forceps and sterile scissors were used to excise the lesion.  Small bleeding was noted and hemostasis was achieved using Monsel's solution.  The patient tolerated the procedure well. Post-procedure instructions  (pelvic rest for one week) were given to the patient. The patient is to call with heavy bleeding, fever greater than 100.4, foul smelling vaginal discharge or other concerns.   ; Assessment:   Vulvar dystrophy s/p biopsy Yeast vaginitis- improved  Plan:   F/u surg path The patient will be return to clinic in two weeks for discussion of results. Pt is to f/u in 4 weeks or sooner prn   Theon Sobotka L. Harraway-Smith, M.D., Cherlynn June

## 2019-05-14 ENCOUNTER — Telehealth: Payer: Self-pay

## 2019-05-14 ENCOUNTER — Encounter: Payer: Self-pay | Admitting: Obstetrics & Gynecology

## 2019-05-14 NOTE — Telephone Encounter (Signed)
Pt called the office stating she is confused about why she had a vulvar biopsy yesterday. Explained to pt that the biopsy was done to rule out any abnormal growths and lichen sclerosus. Pt also wanted to know if she can take a shower. Pt made aware that she can take a shower but she should pat the area dry. Pt made aware that she will receive a call once we receive her biopsy results. Understanding was voiced.  Wendy Mikles l Ayaana Biondo, CMA

## 2019-05-19 ENCOUNTER — Telehealth: Payer: Self-pay

## 2019-05-19 NOTE — Telephone Encounter (Signed)
Pt called the office requesting Vulvar Bx results. Pt made aware that results have not resulted. Understanding was voiced. chiquita l wilson, CMA

## 2019-05-20 LAB — CERVICOVAGINAL ANCILLARY ONLY
Bacterial vaginitis: NEGATIVE
Candida vaginitis: NEGATIVE

## 2019-05-26 ENCOUNTER — Other Ambulatory Visit: Payer: Self-pay | Admitting: Obstetrics & Gynecology

## 2019-05-26 DIAGNOSIS — L900 Lichen sclerosus et atrophicus: Secondary | ICD-10-CM

## 2019-05-26 MED ORDER — CLOBETASOL PROPIONATE 0.05 % EX CREA: 1.0000 "application " | TOPICAL_CREAM | Freq: Two times a day (BID) | CUTANEOUS | 2 refills | Status: DC

## 2019-05-27 NOTE — Telephone Encounter (Signed)
Please call pt. She has a benign skin rash (not cancer). Rx sent to pharmacy. She should f/u in 6 weeks.   Clh-S      Patient called and explained in great detail lichen sclerosus. How it is not cancerous and that it is likely due to decrease in horomones since she is PMP. Explained what temovate cream is and how she is to use it daily. Patient states she is not itching currently and not sure she wants to use the cream since her irritation has ease off some. Explained that Dr. Ihor Dow would like her to use the cream and follow up in six weeks to see if there is any improvement. Patient agrees and makes follow up appointment. Kathrene Alu RN

## 2019-05-29 ENCOUNTER — Encounter: Payer: Self-pay | Admitting: Internal Medicine

## 2019-05-31 ENCOUNTER — Telehealth: Payer: Self-pay

## 2019-05-31 ENCOUNTER — Encounter: Payer: Self-pay | Admitting: Family Medicine

## 2019-05-31 ENCOUNTER — Ambulatory Visit: Payer: Medicare Other

## 2019-05-31 ENCOUNTER — Other Ambulatory Visit: Payer: Self-pay

## 2019-05-31 ENCOUNTER — Ambulatory Visit (INDEPENDENT_AMBULATORY_CARE_PROVIDER_SITE_OTHER): Payer: Medicare Other | Admitting: Family Medicine

## 2019-05-31 DIAGNOSIS — M65311 Trigger thumb, right thumb: Secondary | ICD-10-CM | POA: Diagnosis not present

## 2019-05-31 NOTE — Assessment & Plan Note (Signed)
Symptoms have improved since the bracing and injection. -Can continue the brace at night if desired. -Counseled on supportive care. -Can follow-up as needed and repeat injection if needed.

## 2019-05-31 NOTE — Telephone Encounter (Signed)
-----   Message from Lavonia Drafts, MD sent at 05/31/2019 10:14 AM EDT ----- Regarding: RE: Medication She is safe to use it for the current condition. She should f/u with me in 4 weeks.   Thx,  Clh-S ----- Message ----- From: Valentina Lucks, CMA Sent: 05/28/2019  11:13 AM EDT To: Lavonia Drafts, MD Subject: Medication                                     Pt wants to know how long should she use the Clobetasol cream because package insert says not to use longer than 2 week?

## 2019-05-31 NOTE — Progress Notes (Signed)
Linda MinersKathleen L Jefferson - 73 y.o. female MRN [id number]  Date of birth: June 16, 1946  SUBJECTIVE:  Including CC & ROS.  Chief Complaint  Patient presents with   Follow-up    follow up for right thumb    Linda MinersKathleen L Jefferson is a 73 y.o. female that is following up for her trigger thumb on her right hand.  She received an injection on 7/27.  She is also been compliant with wearing the splint.  She reports her symptoms have significantly improved.  Denies any pain.  Is feeling like her thumb is back to normal.    Review of Systems  Constitutional: Negative for fever.  HENT: Negative for congestion.   Respiratory: Negative for cough.   Cardiovascular: Negative for chest pain.  Gastrointestinal: Negative for abdominal pain.  Musculoskeletal: Positive for arthralgias.  Neurological: Negative for weakness.  Hematological: Negative for adenopathy.    HISTORY: Past Medical, Surgical, Social, and Family History Reviewed & Updated per EMR.   Pertinent Historical Findings include:  Past Medical History:  Diagnosis Date   Anxiety    Carotid artery disease (HCC)    per u/s 01/2009- repeated u/s 7/11 "mild dz, no stenosis"   Colitis    Dyslipidemia    Food allergy    mushrooms   Gallstones    Hyperlipidemia    Hypertension    IBS (irritable bowel syndrome)    LBP (low back pain) 3/11   [facility]   Normal cardiac stress test 04/2009   Osteopenia    Seasonal allergies    Syncope 01/2009   holter atrail tachycardia   Wears hearing aid     Past Surgical History:  Procedure Laterality Date   BUNIONECTOMY Bilateral 03/2013   CARPAL TUNNEL RELEASE Right    CATARACT EXTRACTION Left 12/14/2018   Dr.Beavis   CHOLECYSTECTOMY     TOTAL SHOULDER ARTHROPLASTY Left 04/2016   TRIGGER FINGER RELEASE  03/31/2012   Procedure: RELEASE TRIGGER FINGER/A-1 PULLEY;  Surgeon: Wyn Forsterobert V Sypher Jr., MD;  Location: [facility];  Service:  Orthopedics;  Laterality: Right;  right index    Allergies  Allergen Reactions   Mushroom Extract Complex Shortness Of Breath   Shellfish Allergy Anaphylaxis   Oxycodone Itching and Other (See Comments)    "skin crawling" "skin crawling"   Egg White  [Albumen, Egg] Other (See Comments)    Only eats egg whites. Egg yolk causes nausea. Only eats egg whites   Eggs Or Egg-Derived Products Other (See Comments)    Only eats egg whites. Egg yolk causes nausea. Per patient   Other Other (See Comments)    High blood pressure   Pseudoeph-Doxylamine-Dm-Apap Other (See Comments)    High blood pressure.   Pseudoeph-Doxylamine-Dm-Apap     High blood pressure   Tape Rash and Other (See Comments)    Patient states use paper tape only. All other tapes cause rash. Patient states use paper tape only. All other tapes cause rash. Patient states use paper tape only. All other tapes cause rash. Patient states use paper tape only. All other tapes cause rash.    Family History  Problem Relation Age of Onset   Hypertension Father    Other Father        cerebral hemorrhage   Coronary artery disease Mother [id number]   Stroke Mother    Diabetes Mother    Breast cancer Mother    Hypertension Mother    Hypertension Sister  Breast cancer Maternal Aunt        x 2   Breast cancer Paternal Aunt    Colon cancer Neg Hx      Social History   Socioeconomic History   Marital status: Married    Spouse name: Not on file   Number of children: 3   Years of education: Not on file   Highest education level: Not on file  Occupational History   Occupation: TEACHER-- retired     Fish farm manager: [employer] resource strain: Not on file   Food insecurity    Worry: Not on file    Inability: Not on file   Transportation needs    Medical: Not on file    Non-medical: Not on file  Tobacco Use   Smoking status: Never Smoker   Smokeless tobacco:  Never Used  Substance and Sexual Activity   Alcohol use: Yes    Alcohol/week: 2.0 standard drinks    Types: 2 Glasses of wine per week    Comment: socially wine 4-5x/week   Drug use: No   Sexual activity: Not Currently  Lifestyle   Physical activity    Days per week: Not on file    Minutes per session: Not on file   Stress: Not on file  Relationships   Social connections    Talks on phone: Not on file    Gets together: Not on file    Attends religious service: Not on file    Active member of club or organization: Not on file    Attends meetings of clubs or organizations: Not on file    Relationship status: Not on file   Intimate partner violence    Fear of current or ex partner: Not on file    Emotionally abused: Not on file    Physically abused: Not on file    Forced sexual activity: Not on file  Other Topics Concern   Not on file  Social History Narrative   Household: pt and husband   3 children, one in [place] (PhD)                 PHYSICAL EXAM:  VS: BP 130/80    Ht 5\' 4"  (1.626 m)    Wt 130 lb (59 kg)    BMI 22.31 kg/m  Physical Exam Gen: NAD, alert, cooperative with exam, well-appearing ENT: normal lips, normal nasal mucosa,  Eye: normal EOM, normal conjunctiva and lids CV:  no edema, +2 pedal pulses   Resp: no accessory muscle use, non-labored,  Skin: no rashes, no areas of induration  Neuro: normal tone, normal sensation to touch Psych:  normal insight, alert and oriented MSK:  Right hand:  No triggering of thumb.  Normal thumb flexion, extension and opposition  Normal wrist ROM  Normal grip strength  NVI.       ASSESSMENT & PLAN:   Trigger thumb of right hand Symptoms have improved since the bracing and injection. -Can continue the brace at night if desired. -Counseled on supportive care. -Can follow-up as needed and repeat injection if needed.

## 2019-05-31 NOTE — Telephone Encounter (Signed)
Called pt. Pt made aware that the Clobetasol cream is safe to use. Understanding was voiced.  Bilan Tedesco l Nandika Stetzer, CMA

## 2019-06-01 ENCOUNTER — Ambulatory Visit: Payer: Medicare Other

## 2019-06-02 ENCOUNTER — Ambulatory Visit (INDEPENDENT_AMBULATORY_CARE_PROVIDER_SITE_OTHER): Payer: Medicare Other | Admitting: Obstetrics & Gynecology

## 2019-06-02 ENCOUNTER — Encounter: Payer: Self-pay | Admitting: Internal Medicine

## 2019-06-02 ENCOUNTER — Other Ambulatory Visit: Payer: Self-pay

## 2019-06-02 ENCOUNTER — Encounter: Payer: Self-pay | Admitting: Obstetrics & Gynecology

## 2019-06-02 VITALS — BP 165/69 | HR 90 | Ht 64.0 in | Wt 128.0 lb

## 2019-06-02 DIAGNOSIS — L900 Lichen sclerosus et atrophicus: Secondary | ICD-10-CM | POA: Diagnosis not present

## 2019-06-02 NOTE — Progress Notes (Signed)
History:  73 y.o. [id number] here today for f/u of surg path. Pt reports that she was not aware of where to put the cream. She expressed extreme anxiety assoc with the way that her dx was given to her.  She has concerns about using the steroid cream for > 2 weeks based on what she has read on the      The following portions of the patient's history were reviewed and updated as appropriate: allergies, current medications, past family history, past medical history, past social history, past surgical history and problem list.  Review of Systems:  Pertinent items are noted in HPI.    Objective:  Physical Exam Blood pressure (!) 165/69, pulse 90, height 5\' 4"  (1.626 m), weight 128 lb 0.6 oz (58.1 kg). Gen: NAD Abd: Soft, nontender and nondistended Pelvic: Normal appearing external genitalia; normal appearing vaginal mucosa and cervix.  Normal discharge.  Small uterus, no other palpable masses, no uterine or adnexal tenderness  Labs and Imaging 05/13/2019 Diagnosis Perineum, biopsy, left side - LICHEN SCLEROSUS. - NO DYSPLASIA OR MALIGNANCY.  Assessment & Plan:  Lichen sclerosis  Clobetasol cream bid  F/u in 6 weeks  Pt declined exam explaining where ot place cream so I  described this to her based on when her last bx was done.   I answered the questions of both the pt and her spouse.   Total face-to-face time with patient was 18 min.  Greater than 50% was spent in counseling and coordination of care with the patient.  Johaan Ryser L. Harraway-Smith, M.D., Cherlynn June

## 2019-06-02 NOTE — Telephone Encounter (Signed)
Linda Jefferson,   [facility] hasn't opened up the Flu shot appointments yet for September. So they would have to wait and call back.

## 2019-06-06 ENCOUNTER — Encounter: Payer: Self-pay | Admitting: Obstetrics & Gynecology

## 2019-06-09 ENCOUNTER — Encounter: Payer: Medicare Other | Admitting: Obstetrics & Gynecology

## 2019-06-10 ENCOUNTER — Encounter: Payer: Self-pay | Admitting: Internal Medicine

## 2019-06-10 ENCOUNTER — Ambulatory Visit: Payer: Medicare Other

## 2019-06-11 MED ORDER — CLONAZEPAM 0.5 MG PO TABS: 0.5000 mg | ORAL_TABLET | Freq: Three times a day (TID) | ORAL | 2 refills | Status: DC | PRN

## 2019-06-11 NOTE — Telephone Encounter (Signed)
Clonazepam refill.   Last OV: 04/16/2019 Last Fill: 02/12/2019 #90 and 2RF Pt sig: 1 tab tid prn UDS: 12/15/2018 Low risk

## 2019-06-11 NOTE — Telephone Encounter (Signed)
Sent!

## 2019-06-22 ENCOUNTER — Encounter: Payer: Self-pay | Admitting: Family Medicine

## 2019-06-22 ENCOUNTER — Other Ambulatory Visit: Payer: Self-pay

## 2019-06-22 ENCOUNTER — Ambulatory Visit (INDEPENDENT_AMBULATORY_CARE_PROVIDER_SITE_OTHER): Payer: Medicare Other | Admitting: Family Medicine

## 2019-06-22 VITALS — BP 146/84 | Ht 64.0 in | Wt 126.0 lb

## 2019-06-22 DIAGNOSIS — M25531 Pain in right wrist: Secondary | ICD-10-CM | POA: Insufficient documentation

## 2019-06-22 NOTE — Assessment & Plan Note (Signed)
Unclear if this is degenerative changes of the TFCC affecting the ulnar aspect and in the distal radial ulnar junction.  Reports a history of pseudogout.  No significant effusion or redness today. -She has naproxen at home.  Counseled on its use. -Counseled on supportive care. -If no improvement will consider x-ray or injection.  May need to consider getting a uric acid.

## 2019-06-22 NOTE — Patient Instructions (Signed)
Good to see you Please start taking naproxen 500 mg. Take this 2 times daily for 4-5 days straight.  Please use ice as needed  You can take tylenol with the naproxen   Please send me a message in MyChart with any questions or updates.  Please see me back in 2-3 weeks if no better.   --Dr. Raeford Razor

## 2019-06-22 NOTE — Progress Notes (Signed)
Linda MinersKathleen L Richwine - 73 y.o. female MRN [id number]  Date of birth: Feb 01, 1946  SUBJECTIVE:  Including CC & ROS.  Chief Complaint  Patient presents with  . Wrist Pain    right wrist     Linda MinersKathleen L Stelzer is a 73 y.o. female that is  presenting with right ulnar-sided wrist pain.  The pain is also occurring in between the distal ulna radial junction.  She initially had some swelling and redness.  She has been using ice and resting seem to have improved.  She denies any repetitive motions.  She does report a history of pseudogout..  Pain is improved today. Also feeling pain over the common extensor tendons.    Review of Systems  Constitutional: Negative for fever.  HENT: Negative for congestion.   Respiratory: Negative for cough.   Cardiovascular: Negative for chest pain.  Gastrointestinal: Negative for abdominal pain.  Musculoskeletal: Positive for arthralgias.  Neurological: Negative for weakness.  Hematological: Negative for adenopathy.    HISTORY: Past Medical, Surgical, Social, and Family History Reviewed & Updated per EMR.   Pertinent Historical Findings include:  Past Medical History:  Diagnosis Date  . Anxiety   . Carotid artery disease (HCC)    per u/s 01/2009- repeated u/s 7/11 "mild dz, no stenosis"  . Colitis   . Dyslipidemia   . Food allergy    mushrooms  . Gallstones   . Hyperlipidemia   . Hypertension   . IBS (irritable bowel syndrome)   . LBP (low back pain) 3/11   after a local [facility]  . Normal cardiac stress test 04/2009  . Osteopenia   . Seasonal allergies   . 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 Wears hearing aid     Past Surgical History:  Procedure Laterality Date  . BUNIONECTOMY Bilateral 03/2013  . CARPAL TUNNEL RELEASE Right   . CATARACT EXTRACTION Left 12/14/2018   Dr.Beavis  . CHOLECYSTECTOMY    . TOTAL SHOULDER ARTHROPLASTY Left 04/2016  . TRIGGER FINGER RELEASE  03/31/2012   Procedure: RELEASE TRIGGER  FINGER/A-1 PULLEY;  Surgeon: Wyn Forsterobert V Sypher Jr., MD;  Location: [facility];  Service: Orthopedics;  Laterality: Right;  right index    Allergies  Allergen Reactions  . Mushroom Extract Complex Shortness Of Breath  . Shellfish Allergy Anaphylaxis  . Oxycodone Itching and Other (See Comments)    "skin crawling" "skin crawling"  . Egg White  [Albumen, Egg] Other (See Comments)    Only eats egg whites. Egg yolk causes nausea. Only eats egg whites  . Eggs Or Egg-Derived Products Other (See Comments)    Only eats egg whites. Egg yolk causes nausea. Per patient  . Other Other (See Comments)    High blood pressure  . Pseudoeph-Doxylamine-Dm-Apap Other (See Comments)    High blood pressure.  . Pseudoeph-Doxylamine-Dm-Apap     High blood pressure  . Tape Rash and Other (See Comments)    Patient states use paper tape only. All other tapes cause rash. Patient states use paper tape only. All other tapes cause rash. Patient states use paper tape only. All other tapes cause rash. Patient states use paper tape only. All other tapes cause rash.    Family History  Problem Relation Age of Onset  . Hypertension Father   . Other Father        cerebral hemorrhage  . Coronary artery disease Mother 3850  . Stroke Mother   . Diabetes Mother   .  Breast cancer Mother   . Hypertension Mother   . Hypertension Sister   . Breast cancer Maternal Aunt        x 2  . Breast cancer Paternal Aunt   . Colon cancer Neg Hx      Social History   Socioeconomic History  . Marital status: Married    Spouse name: Not on file  . Number of children: 3  . Years of education: Not on file  . Highest education level: Not on file  Occupational History  . Occupation: TEACHER-- retired     Fish farm manager: [employer]  Social Needs  . Financial resource strain: Not on file  . Food insecurity    Worry: Not on file    Inability: Not on file  . Transportation needs    Medical: Not on file     Non-medical: Not on file  Tobacco Use  . Smoking status: Never Smoker  . Smokeless tobacco: Never Used  Substance and Sexual Activity  . Alcohol use: Yes    Alcohol/week: 2.0 standard drinks    Types: 2 Glasses of wine per week    Comment: socially wine 4-5x/week  . Drug use: No  . Sexual activity: Not Currently  Lifestyle  . Physical activity    Days per week: Not on file    Minutes per session: Not on file  . Stress: Not on file  Relationships  . Social Herbalist on phone: Not on file    Gets together: Not on file    Attends religious service: Not on file    Active member of club or organization: Not on file    Attends meetings of clubs or organizations: Not on file    Relationship status: Not on file  . Intimate partner violence    Fear of current or ex partner: Not on file    Emotionally abused: Not on file    Physically abused: Not on file    Forced sexual activity: Not on file  Other Topics Concern  . Not on file  Social History Narrative   Household: pt and husband   3 children, one in [place] (PhD)                 PHYSICAL EXAM:  VS: BP (!) 146/84   Ht 5\' 4"  (1.626 m)   Wt 126 lb (57.2 kg)   BMI 21.63 kg/m  Physical Exam Gen: NAD, alert, cooperative with exam, well-appearing ENT: normal lips, normal nasal mucosa,  Eye: normal EOM, normal conjunctiva and lids CV:  no edema, +2 pedal pulses   Resp: no accessory muscle use, non-labored,  Skin: no rashes, no areas of induration  Neuro: normal tone, normal sensation to touch Psych:  normal insight, alert and oriented MSK:  Right wrist:  No obvious swelling or redness. Tenderness to palpation over the distal radius ulnar junction. Normal wrist range of motion. Normal grip strength. Some pain with ulnar deviation. Normal elbow range of motion. Neurovascular intact     ASSESSMENT & PLAN:   Right wrist pain Unclear if this is degenerative changes of the TFCC affecting the ulnar  aspect and in the distal radial ulnar junction.  Reports a history of pseudogout.  No significant effusion or redness today. -She has naproxen at home.  Counseled on its use. -Counseled on supportive care. -If no improvement will consider x-ray or injection.  May need to consider getting a uric acid.

## 2019-07-09 ENCOUNTER — Encounter: Payer: Self-pay | Admitting: Obstetrics & Gynecology

## 2019-07-09 ENCOUNTER — Ambulatory Visit: Payer: Medicare Other | Admitting: Obstetrics & Gynecology

## 2019-07-09 ENCOUNTER — Other Ambulatory Visit: Payer: Self-pay

## 2019-07-09 VITALS — BP 139/73 | HR 92 | Wt 128.0 lb

## 2019-07-09 DIAGNOSIS — N904 Leukoplakia of vulva: Secondary | ICD-10-CM

## 2019-07-09 NOTE — Progress Notes (Addendum)
History:  73 y.o. [id number] here today for pt reports that her sx are improved as a result of using the Clobetasol.   She reports that she is not having itching or other sx at present.   The following portions of the patient's history were reviewed and updated as appropriate: allergies, current medications, past family history, past medical history, past social history, past surgical history and problem list.  Review of Systems:  Pertinent items are noted in HPI.    Objective:  Physical Exam Blood pressure (!) 160/75, pulse 97, weight 128 lb (58.1 kg).  CONSTITUTIONAL: Well-developed, well-nourished female in no acute distress.  HENT:  Normocephalic, atraumatic EYES: Conjunctivae and EOM are normal. No scleral icterus.  NECK: Normal range of motion SKIN: Skin is warm and dry. No rash noted. Not diaphoretic.No pallor. [place]: Alert and oriented to person, place, and time. Normal coordination.  Pelvic:  external genitalia: there is still the hypopigmented area.  No excoriations noted. Tissue intact.   Labs and Imaging 05/13/2019 Diagnosis Perineum, biopsy, left side - LICHEN SCLEROSUS. - NO DYSPLASIA OR MALIGNANCY.  Assessment & Plan:  Lichen sclerosus  Keep clobetasol cream  F/u in 3 months or sooner prn  Total face-to-face time with patient was 15 min.  Greater than 50% was spent in counseling and coordination of care with the patient.   Yamaris Cummings L. Harraway-Smith, M.D., Cherlynn June

## 2019-07-13 ENCOUNTER — Ambulatory Visit (INDEPENDENT_AMBULATORY_CARE_PROVIDER_SITE_OTHER): Payer: Medicare Other

## 2019-07-13 ENCOUNTER — Other Ambulatory Visit: Payer: Self-pay

## 2019-07-13 ENCOUNTER — Ambulatory Visit: Payer: Medicare Other

## 2019-07-13 DIAGNOSIS — Z23 Encounter for immunization: Secondary | ICD-10-CM

## 2019-07-16 ENCOUNTER — Encounter: Payer: Self-pay | Admitting: Obstetrics & Gynecology

## 2019-09-20 ENCOUNTER — Encounter: Payer: Self-pay | Admitting: Internal Medicine

## 2019-10-07 ENCOUNTER — Encounter: Payer: Self-pay | Admitting: Internal Medicine

## 2019-10-18 ENCOUNTER — Ambulatory Visit: Payer: Medicare PPO | Admitting: Obstetrics & Gynecology

## 2019-10-18 ENCOUNTER — Other Ambulatory Visit: Payer: Self-pay

## 2019-10-18 ENCOUNTER — Encounter: Payer: Self-pay | Admitting: Obstetrics & Gynecology

## 2019-10-18 DIAGNOSIS — L900 Lichen sclerosus et atrophicus: Secondary | ICD-10-CM

## 2019-10-18 MED ORDER — CLOBETASOL PROPIONATE 0.05 % EX CREA: 1.0000 "application " | TOPICAL_CREAM | Freq: Two times a day (BID) | CUTANEOUS | 2 refills | Status: DC

## 2019-10-18 NOTE — Progress Notes (Signed)
History:  74 y.o. [id number] here today for f/u of lichen sclerosus. Pt reports no itching or problems.  She continues to use the cream bid. She is hesitant to decrease to once a day.   The following portions of the patient's history were reviewed and updated as appropriate: allergies, current medications, past family history, past medical history, past social history, past surgical history and problem list.  Review of Systems:  Pertinent items are noted in HPI.    Objective:  Physical Exam Blood pressure 140/78, pulse 90, height 5\' 4"  (1.626 m), weight 130 lb (59 kg).  CONSTITUTIONAL: Well-developed, well-nourished female in no acute distress.  HENT:  Normocephalic, atraumatic EYES: Conjunctivae and EOM are normal. No scleral icterus.  NECK: Normal range of motion SKIN: Skin is warm and dry. No rash noted. Not diaphoretic.No pallor. NEUROLGIC: Alert and oriented to person, place, and time. Normal coordination.   Pelvic: Normal atrophic appearing external genitalia; the site of the biopsy has some pigmentation (tattooing)  from the silver nitrate. This is unchanged from post biopsy. No new lesions noted    Assessment & Plan:  Felesha was seen today for follow-up.  Diagnoses and all orders for this visit:  Lichen sclerosus -     clobetasol cream (TEMOVATE) 0.05 %; Apply 1 application topically 2 (two) times daily. Apply to affected area  f/u in 6 months or sooner prn  Goerge Mohr L. Harraway-Smith, M.D., Nicholos Johns

## 2019-10-20 ENCOUNTER — Encounter: Payer: Self-pay | Admitting: Internal Medicine

## 2019-10-20 MED ORDER — CLONAZEPAM 0.5 MG PO TABS: 0.5000 mg | ORAL_TABLET | Freq: Three times a day (TID) | ORAL | 1 refills | Status: DC | PRN

## 2019-10-20 NOTE — Telephone Encounter (Signed)
Last Clonazepam RX:  06/11/19, #90 x 2 refills Last OV:  04/16/19 Next OV:  No future appt scheduled with PCP UDS:  12/12/17, past due CSC:  12/12/17, past due

## 2019-10-20 NOTE — Telephone Encounter (Signed)
Refill sent.

## 2019-11-16 ENCOUNTER — Ambulatory Visit: Payer: Medicare PPO

## 2019-11-16 ENCOUNTER — Other Ambulatory Visit: Payer: Self-pay

## 2019-11-16 DIAGNOSIS — Z23 Encounter for immunization: Secondary | ICD-10-CM

## 2019-11-16 NOTE — Progress Notes (Signed)
Patient here today for pneumovax vaccine. 0.5 mL given in right deltoid IM. Vis given.

## 2019-11-17 ENCOUNTER — Encounter: Payer: Self-pay | Admitting: Internal Medicine

## 2019-12-12 ENCOUNTER — Other Ambulatory Visit: Payer: Self-pay | Admitting: Internal Medicine

## 2019-12-16 NOTE — Progress Notes (Signed)
Nurse connected with patient 12/17/19 at  9:30 AM EST by a telephone enabled telemedicine application and verified that I am speaking with the correct person using two identifiers. Patient stated full name and DOB. Patient gave permission to continue with virtual visit. Patient's location was at home and Nurse's location was at [place] office.   Subjective:   Linda Jefferson is a 74 y.o. female who presents for Medicare Annual (Subsequent) preventive examination.  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 Also involved in a book club.  Review of Systems: No ROS.  Medicare Wellness Virtual Visit.  Visual/audio telehealth visit, UTA vital signs.   See social history for additional risk factors.  Home Safety/Smoke Alarms: Feels safe in home. Smoke alarms in place.  Lives in split level home w/ husband. No trouble w/ stairs.   Female:   Mammo-  12/22/18     Dexa scan- declines    CCS-next due 2028    Objective:     Vitals: Unable to assess. This visit is enabled though telemedicine due to Covid 19.   Advanced Directives 12/17/2019 12/15/2018 12/12/2017 11/25/2016 11/03/2016 10/29/2016 03/27/2012  Does Patient Have a Medical Advance Directive? Yes Yes Yes Yes - Yes Patient does not have advance directive  Type of Advance Directive Healthcare Power of [place];Living will Healthcare Power of [place];Living will Healthcare Power of [place];Living will Living will;Healthcare Power of Attorney Living will;Healthcare Power of [employer] Power of [place];Living will -  Does patient want to make changes to medical advance directive? No - Patient declined No - Patient declined - No - Patient declined - - -  Copy of Healthcare Power of Attorney in Chart? No - copy requested No - copy requested No - copy requested No - copy requested No - copy requested - -    Tobacco Social History   Tobacco Use  Smoking Status Never Smoker  Smokeless Tobacco Never Used     Counseling given: Not Answered   Clinical  Intake: Pain : No/denies pain     Past Medical History:  Diagnosis Date  . Anxiety   . Carotid artery disease (HCC)    per u/s 01/2009- repeated u/s 7/11 "mild dz, no stenosis"  . Colitis   . Dyslipidemia   . Food allergy    mushrooms  . Gallstones   . Hyperlipidemia   . Hypertension   . IBS (irritable bowel syndrome)   . LBP (low back pain) 3/11   after a local injection, f/u [facility]  . Normal cardiac stress test 04/2009  . Osteopenia   . Seasonal allergies   . 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 Wears hearing aid    Past Surgical History:  Procedure Laterality Date  . BUNIONECTOMY Bilateral 03/2013  . CARPAL TUNNEL RELEASE Right   . CATARACT EXTRACTION Left 12/14/2018   Dr.Beavis  . CHOLECYSTECTOMY    . TOTAL SHOULDER ARTHROPLASTY Left 04/2016  . TRIGGER FINGER RELEASE  03/31/2012   Procedure: RELEASE TRIGGER FINGER/A-1 PULLEY;  Surgeon: Wyn Forster., MD;  Location: [facility];  Service: Orthopedics;  Laterality: Right;  right index   Family History  Problem Relation Age of Onset  . Hypertension Father   . Other Father        cerebral hemorrhage  . Coronary artery disease Mother 35  . Stroke Mother   . Diabetes Mother   . Breast cancer Mother   . Hypertension Mother   . Hypertension Sister   .  Breast cancer Maternal Aunt        x 2  . Breast cancer Paternal Aunt   . Colon cancer Neg Hx    Social History   Socioeconomic History  . Marital status: Married    Spouse name: Not on file  . Number of children: 3  . Years of education: Not on file  . Highest education level: Not on file  Occupational History  . 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 Smoking status: Never Smoker  . Smokeless tobacco: Never Used  Substance and Sexual Activity  . Alcohol use: Yes    Alcohol/week: 2.0 standard drinks    Types: 2 Glasses of wine per week    Comment: socially wine 4-5x/week    . Drug use: No  . Sexual activity: Not Currently  Other Topics Concern  . Not on file  Social History Narrative   Household: pt and husband   3 children, one in [place] (PhD)               Social Determinants of Health   Financial Resource Strain: Low Risk   . Difficulty of Paying Living Expenses: Not hard at all  Food Insecurity: No Food Insecurity  . Worried About Programme researcher, broadcasting/film/video in the Last Year: Never true  . Ran Out of Food in the Last Year: Never true  Transportation Needs: No Transportation Needs  . Lack of Transportation (Medical): No  . Lack of Transportation (Non-Medical): No  Physical Activity:   . Days of Exercise per Week:   . Minutes of Exercise per Session:   Stress:   . Feeling of Stress :   Social Connections:   . Frequency of Communication with Friends and Family:   . Frequency of Social Gatherings with Friends and Family:   . Attends Religious Services:   . Active Member of Clubs or Organizations:   . Attends Banker Meetings:   Marland Kitchen Marital Status:     Outpatient Encounter Medications as of 12/17/2019  Medication Sig  . amLODipine-benazepril (LOTREL) 5-10 MG capsule Take 1 capsule by mouth daily.  . Artificial Tear Ointment (DRY EYES OP) Place 2 drops into both eyes as needed (for dry eyes).  Marland Kitchen aspirin EC 81 MG tablet Take 81 mg by mouth daily.  . clobetasol cream (TEMOVATE) 0.05 % Apply 1 application topically 2 (two) times daily. Apply to affected area  . clonazePAM (KLONOPIN) 0.5 MG tablet Take 1 tablet (0.5 mg total) by mouth 3 (three) times daily as needed for anxiety.  . diclofenac sodium (VOLTAREN) 1 % GEL Apply 2 g topically 4 (four) times daily as needed.  . diphenoxylate-atropine (LOMOTIL) 2.5-0.025 MG tablet Take 1 tablet by mouth every 8 (eight) hours as needed for diarrhea or loose stools.  Marland Kitchen Fexofenadine HCl (ALLEGRA ALLERGY PO) Take 1 tablet by mouth daily.  . finasteride (PROSCAR) 5 MG tablet Take 1 tablet (5 mg  total) by mouth daily.  . fluconazole (DIFLUCAN) 150 MG tablet Take 1 tablet (150 mg total) by mouth daily.  . fluticasone (FLONASE) 50 MCG/ACT nasal spray Place 2 sprays into both nostrils daily.  Marland Kitchen lidocaine-hydrocortisone (ANAMANTEL HC) 3-0.5 % CREA Place 1 Applicatorful rectally 2 (two) times daily. As needed  . Multiple Vitamin (MULTIVITAMIN) capsule Take 1 capsule by mouth daily.    . polyethylene glycol powder (GLYCOLAX/MIRALAX) powder Take 8.5 g by mouth daily.   . raloxifene (EVISTA) 60  MG tablet Take 1 tablet (60 mg total) by mouth daily.  . simvastatin (ZOCOR) 20 MG tablet Take 1 tablet (20 mg total) by mouth at bedtime.  . Wheat Dextrin (BENEFIBER) POWD Take 1 tsp by mouth three times a day with meals  . [DISCONTINUED] azelastine (ASTELIN) 0.1 % nasal spray Two sprays each nostril twice a day as needed for sneezing and itchy nose.  . [DISCONTINUED] metroNIDAZOLE (METROGEL) 0.75 % vaginal gel Place vaginally at bedtime.  . [DISCONTINUED] valACYclovir (VALTREX) 1000 MG tablet Take 1 tablet (1,000 mg total) by mouth 3 (three) times daily.   No facility-administered encounter medications on file as of 12/17/2019.    Activities of Daily Living In your present state of health, do you have any difficulty performing the following activities: 12/17/2019  Hearing? N  Vision? N  Difficulty concentrating or making decisions? N  Walking or climbing stairs? N  Dressing or bathing? N  Doing errands, shopping? N  Preparing Food and eating ? N  Using the Toilet? N  In the past six months, have you accidently leaked urine? N  Do you have problems with loss of bowel control? N  Managing your Medications? N  Managing your Finances? N  Housekeeping or managing your Housekeeping? N  Some recent data might be hidden    Patient Care Team: Wanda Plump, MD as PCP - General (Internal Medicine) Wanda Plump, MD (Internal Medicine) Larence Penning, OD as Referring Physician (Optometry) Jodi Geralds, MD  as Consulting Physician (Orthopedic Surgery) Mikki Santee, MD (Inactive) as Consulting Physician (Allergy and Immunology)    Assessment:   This is a routine wellness examination for Kalecia. Physical assessment deferred to PCP.  Exercise Activities and Dietary recommendations Current Exercise Habits: Home exercise routine, Type of exercise: walking, Time (Minutes): 15, Frequency (Times/Week): 4, Weekly Exercise (Minutes/Week): 60, Intensity: Mild, Exercise limited by: None identified Diet (meal preparation, eat out, water intake, caffeinated beverages, dairy products, fruits and vegetables): well balanced   Goals    . Return to normal exercise routine    . Taking to a small beach trip.        Fall Risk Fall Risk  12/17/2019 12/15/2018 12/12/2017 11/25/2016 07/05/2016  Falls in the past year? 0 0 No No No  Number falls in past yr: 0 - - - -  Injury with Fall? 0 - - - -  Follow up Education provided;Falls prevention discussed - - - -    Depression Screen PHQ 2/9 Scores 12/17/2019 12/15/2018 12/12/2017 11/25/2016  PHQ - 2 Score 0 0 0 0     Cognitive Function Ad8 score reviewed for issues:  Issues making decisions:no  Less interest in hobbies / activities:no  Repeats questions, stories (family complaining):no  Trouble using ordinary gadgets (microwave, computer, phone):no  Forgets the month or year: no  Mismanaging finances: no  Remembering appts:no  Daily problems with thinking and/or memory:no Ad8 score is=0   MMSE - Mini Mental State Exam 11/25/2016  Orientation to time 5  Orientation to Place 5  Registration 3  Attention/ Calculation 5  Recall 3  Language- name 2 objects 2  Language- repeat 1  Language- follow 3 step command 3  Language- read & follow direction 1  Write a sentence 1  Copy design 1  Total score 30        Immunization History  Administered Date(s) Administered  . Influenza Inj Mdck Quad Pf 07/13/2019  . Influenza, Quadrivalent, Recombinant,  Inj, Pf 08/15/2016, 08/05/2017, 07/10/2018  .  Pneumococcal Conjugate-13 05/13/2019  . Pneumococcal Polysaccharide-23 11/16/2019  . Td 02/04/2009    Screening Tests Health Maintenance  Topic Date Due  . TETANUS/TDAP  02/05/2019  . MAMMOGRAM  12/22/2019  . COLONOSCOPY  12/26/2026  . DEXA SCAN  Completed  . Hepatitis C Screening  Completed  . PNA vac Low Risk Adult  Completed      Plan:    Please schedule your next medicare wellness visit with me in 1 yr.  Continue to eat heart healthy diet (full of fruits, vegetables, whole grains, lean protein, water--limit salt, fat, and sugar intake) and increase physical activity as tolerated.  Continue doing brain stimulating activities (puzzles, reading, adult coloring books, staying active) to keep memory sharp.   Bring a copy of your living will and/or healthcare power of attorney to your next office visit.   I have personally reviewed and noted the following in the patient's chart:   . Medical and social history . Use of alcohol, tobacco or illicit drugs  . Current medications and supplements . Functional ability and status . Nutritional status . Physical activity . Advanced directives . List of other physicians . Hospitalizations, surgeries, and ER visits in previous 12 months . Vitals . Screenings to include cognitive, depression, and falls . Referrals and appointments  In addition, I have reviewed and discussed with patient certain preventive protocols, quality metrics, and best practice recommendations. A written personalized care plan for preventive services as well as general preventive health recommendations were provided to patient.     Shela Nevin, [place]  12/17/2019

## 2019-12-17 ENCOUNTER — Other Ambulatory Visit: Payer: Self-pay

## 2019-12-17 ENCOUNTER — Ambulatory Visit (INDEPENDENT_AMBULATORY_CARE_PROVIDER_SITE_OTHER): Payer: Medicare PPO | Admitting: *Deleted

## 2019-12-17 ENCOUNTER — Encounter: Payer: Self-pay | Admitting: *Deleted

## 2019-12-17 DIAGNOSIS — Z0000 Encounter for general adult medical examination without abnormal findings: Secondary | ICD-10-CM | POA: Diagnosis not present

## 2019-12-17 NOTE — Patient Instructions (Signed)
Please schedule your next medicare wellness visit with me in 1 yr.  Continue to eat heart healthy diet (full of fruits, vegetables, whole grains, lean protein, water--limit salt, fat, and sugar intake) and increase physical activity as tolerated.  Continue doing brain stimulating activities (puzzles, reading, adult coloring books, staying active) to keep memory sharp.   Bring a copy of your living will and/or healthcare power of attorney to your next office visit.   Linda Jefferson , Thank you for taking time to come for your Medicare Wellness Visit. I appreciate your ongoing commitment to your health goals. Please review the following plan we discussed and let me know if I can assist you in the future.   These are the goals we discussed: Goals    . Return to normal exercise routine    . Taking to a small beach trip.        This is a list of the screening recommended for you and due dates:  Health Maintenance  Topic Date Due  . Tetanus Vaccine  02/05/2019  . Mammogram  12/22/2019  . Colon Cancer Screening  12/26/2026  . DEXA scan (bone density measurement)  Completed  .  Hepatitis C: One time screening is recommended by Center for Disease Control  (CDC) for  adults born from [birth year] through 1966.   Completed  . Pneumonia vaccines  Completed    Cuidados preventivos aps os 65 anos de idade, mulheres Preventive Care 69 Years and Older, Female Cuidados preventivos se referem a escolhas de estilo de vida e consultas com seu mdico capazes de promover a sade e o bem-estar. Isso inclui:  Um exame fsico anual. Ele tambm  chamado check-up anual.  Consultas regulares ao dentista e ao oftalmologista.  Imunizaes.  Triagem para certas doenas.  Escolhas saudveis de estilo de vida, como dieta e exerccios. O que posso esperar de minhas consultas preventivas? Exame fsico Seu mdico verificar:  Altura e peso. Eles podem ser usados ??para calcular o IMC (ndice de massa corporal), que   uma medida que indica se voc est com um peso saudvel.  [place]y [place] cardaca e presso arterial.  Sua pele, para ver se h manchas com alteraes. Aconselhamento Seu mdico poder lhe perguntar sobre:  O uso de tabaco, lcool e drogas.  O bem-estar emocional.  O bem-estar em casa e nos relacionamentos.  A atividade sexual.  Os hbitos alimentares.  O histrico de quedas.  A memria e a capacidade de compreenso (capacidade cognitiva).  O trabalho e o ambiente de trabalho.  Histrico de [employer]. Quais imunizaes eu preciso tomar?  Vacina contra influenza (gripe)  Essa vacina  recomendada todos os anos. Vacina contra ttano, difteria e coqueluche (vacina Tdap)  Voc poder precisar de um reforo da Td a cada 10 anos. Vacina contra catapora (varicela)  Voc pode ter que tomar essa vacina se ainda no tiver sido vacinado. Vacina contra herpes zster (cobreiro)  Voc poder precisar dela depois dos 60 anos. Vacina conjugada pneumoccica (PCV13)  Uma dose  recomendada aps os 65 anos de idade. Vacina pneumoccica polissacardica (PPSV23)  Uma dose  recomendada aps os 65 anos de idade. Vacina contra sarampo, rubola e caxumba (MMR)  Voc poder precisar de pelo menos uma dose da MMR se tiver nascido em 1958 ou posteriormente. Voc tambm poder precisar de uma segunda dose. Vacina meningoccica conjugada (MenACWY)  Voc poder precisar dela se sofrer de certos quadros clnicos. Vacina contra hepatite A  Voc poder precisar dessa vacina se  apresentar certos quadros clnicos ou viajar para ou trabalhar em lugares nos quais pode ser exposto  hepatite A. Vacina contra hepatite B  Voc poder precisar dessa vacina se apresentar certos quadros clnicos ou viajar para ou trabalhar em lugares nos quais pode ser exposto  hepatite B. Vacina contra o Haemophilus influenzae tipo b (Hib)  Voc poder precisar dela se sofrer de certos quadros  clnicos. Voc pode tomar vacinas em doses individuais ou diversas vacinas juntas em uma nica dose (vacinas combinadas). Converse com seu mdico sobre os riscos e benefcios das vacinas combinadas. Quais exames eu preciso fazer? Exames de sangue  Nveis de lipdeos e colesterol. Esses exames podem ser repetidos a cada 5 anos ou com maior frequncia dependendo da sua sade geral.  Exame de hepatite C.  Exame de hepatite B. Exames preventivos  Exame preventivo de cncer de pulmo. Voc poder fazer esse exame preventivo comeando aos 55 anos se tiver histrico de consumo de 30 unidades mao-ano e fume atualmente ou tenha parado nos ltimos 15 anos.  Exame preventivo do cncer colorretal. Todos os adultos, a [employer] 50 at os 75 anos de idade, devem fazer esse exame preventivo. Seu mdico poder recomendar o exame preventivo aos 45 anos, caso voc tenha maior risco. Voc far exames a cada 1-10 anos, dependendo [employer] e do tipo de exame preventivo.  Exame preventivo de diabetes. Isso  feito verificando-se o acar no sangue (glicose) depois de voc no comer por algum tempo (jejum). Isso poder ser feito a cada 1-3 anos.  Mamografia. Poder ser feita a cada 1-2 anos. Pergunte a seu mdico com qual frequncia voc deve realizar mamografias periodicamente.  Exame de preveno do cncer baseado em BRCA. Esse exame poder ser realizado caso voc tenha histrico de cncer de mama, de ovrio, das tubas uterinas ou de peritnio. Outros exames  Exame para doenas sexualmente transmissveis (DSTs).  Exame da densidade ssea. Esse exame  feito para verificar a existncia de osteoporose. Ele poder comear a ser realizado aos 65 anos de idade. Siga essas instrues em casa: Alimentos e bebidas  Mantenha uma dieta que inclua frutas e legumes frescos, gros integrais, fontes magras de protena e produtos lcteos com baixo teor de [place]. Limite a ingesto de alimentos com alta quantidade  de acar, gorduras saturadas e sal.  Tome suplementos de vitaminas e minerais de acordo com as recomendaes do seu mdico.  No consuma lcool se seu mdico disser para voc no beber.  Se voc consome lcool: ? Limite seu consumo a 0-1 dose por dia. ? Observe quanto lcool sua bebida contm. Nos EUA, um drinque equivale a uma lata de cerveja de 12 oz (355 ml), uma taa de vinho de 5 oz (148 ml) ou uma dose de bebida destilada de 1 oz (44 ml). Estilo de vida  Faa o cuidado dirio dos seus dentes e gengivas.  Permanea ativa. Exercite-se por pelo menos 30 minutos em 5 dias da semana ou [place].  No use nenhum produto que contenha nicotina ou tabaco, como cigarros tradicionais, cigarros eletrnicos e fumo de Higher education careers adviser. Caso precise de ajuda para parar de fumar, fale com seu mdico.  Se voc for sexualmente ativa, faa sexo seguro. Use um preservativo ou outra forma de proteo com o objetivo de prevenir ISTs (infeces sexualmente transmissveis).  Converse com seu mdico sobre tomar uma dose baixa de aspirina ou estatina. O que vem a seguir?  Faa uma consulta com seu mdico uma vez por ano para a consulta de  check-up.  Pergunte ao seu mdico com que frequncia seus olhos e dentes devem ser examinados.  Mantenha todas as suas vacinas em dia. Estas informaes no se destinam a substituir as recomendaes de seu mdico. No deixe de discutir quaisquer dvidas com seu mdico. Document Revised: 10/13/2018 Document Reviewed: 10/13/2018 Elsevier Patient Education  [place].

## 2019-12-22 ENCOUNTER — Other Ambulatory Visit: Payer: Self-pay

## 2019-12-22 ENCOUNTER — Ambulatory Visit (INDEPENDENT_AMBULATORY_CARE_PROVIDER_SITE_OTHER): Payer: Medicare PPO | Admitting: Internal Medicine

## 2019-12-22 ENCOUNTER — Encounter: Payer: Self-pay | Admitting: Internal Medicine

## 2019-12-22 VITALS — BP 141/65 | HR 71 | Temp 97.0°F | Resp 16 | Ht 64.0 in | Wt 132.2 lb

## 2019-12-22 DIAGNOSIS — Z0000 Encounter for general adult medical examination without abnormal findings: Secondary | ICD-10-CM

## 2019-12-22 DIAGNOSIS — E7849 Other hyperlipidemia: Secondary | ICD-10-CM

## 2019-12-22 DIAGNOSIS — I10 Essential (primary) hypertension: Secondary | ICD-10-CM | POA: Diagnosis not present

## 2019-12-22 DIAGNOSIS — F419 Anxiety disorder, unspecified: Secondary | ICD-10-CM | POA: Diagnosis not present

## 2019-12-22 DIAGNOSIS — Z79899 Other long term (current) drug therapy: Secondary | ICD-10-CM

## 2019-12-22 LAB — CBC WITH DIFFERENTIAL/PLATELET
Basophils Absolute: 0 10*3/uL (ref 0.0–0.1)
Basophils Relative: 0.8 % (ref 0.0–3.0)
Eosinophils Absolute: 0.1 10*3/uL (ref 0.0–0.7)
Eosinophils Relative: 2.8 % (ref 0.0–5.0)
HCT: 37.9 % (ref 36.0–46.0)
Hemoglobin: 12.9 g/dL (ref 12.0–15.0)
Lymphocytes Relative: 21.6 % (ref 12.0–46.0)
Lymphs Abs: 1.1 10*3/uL (ref 0.7–4.0)
MCHC: 34 g/dL (ref 30.0–36.0)
MCV: 91.8 fl (ref 78.0–100.0)
Monocytes Absolute: 0.3 10*3/uL (ref 0.1–1.0)
Monocytes Relative: 6.8 % (ref 3.0–12.0)
Neutro Abs: 3.4 10*3/uL (ref 1.4–7.7)
Neutrophils Relative %: 68 % (ref 43.0–77.0)
Platelets: 327 10*3/uL (ref 150.0–400.0)
RBC: 4.13 Mil/uL (ref 3.87–5.11)
RDW: 13.6 % (ref 11.5–15.5)
WBC: 5 10*3/uL (ref 4.0–10.5)

## 2019-12-22 LAB — COMPREHENSIVE METABOLIC PANEL
ALT: 19 U/L (ref 0–35)
AST: 29 U/L (ref 0–37)
Albumin: 4.3 g/dL (ref 3.5–5.2)
Alkaline Phosphatase: 79 U/L (ref 39–117)
BUN: 16 mg/dL (ref 6–23)
CO2: 31 mEq/L (ref 19–32)
Calcium: 10.2 mg/dL (ref 8.4–10.5)
Chloride: 100 mEq/L (ref 96–112)
Creatinine, Ser: 0.65 mg/dL (ref 0.40–1.20)
GFR: 88.9 mL/min (ref >60.00)
Glucose, Bld: 90 mg/dL (ref 70–99)
Potassium: 4.9 mEq/L (ref 3.5–5.1)
Sodium: 137 mEq/L (ref 135–145)
Total Bilirubin: 0.4 mg/dL (ref 0.2–1.2)
Total Protein: 7.2 g/dL (ref 6.0–8.3)

## 2019-12-22 LAB — LIPID PANEL
Cholesterol: 154 mg/dL (ref 0–200)
HDL: 67.7 mg/dL (ref >39.00)
LDL Cholesterol: 69 mg/dL (ref 0–99)
NonHDL: 86.11
Total CHOL/HDL Ratio: 2
Triglycerides: 88 mg/dL (ref 0.0–149.0)
VLDL: 17.6 mg/dL (ref 0.0–40.0)

## 2019-12-22 MED ORDER — CLONAZEPAM 0.5 MG PO TABS: 0.5000 mg | ORAL_TABLET | Freq: Four times a day (QID) | ORAL | 1 refills | Status: DC | PRN

## 2019-12-22 NOTE — Progress Notes (Signed)
Subjective:    Patient ID: Linda Jefferson, female    DOB: Oct 25, 1945, 74 y.o.   MRN: [id number]  DOS:  12/22/2019 Type of visit - description: cpx Since the last office visit he is doing well. Main concern is anxiety, we discussed clonazepam.   Review of Systems  A 14 point review of systems is negative    Past Medical History:  Diagnosis Date  . Anxiety   . Carotid artery disease (HCC)    per u/s 01/2009- repeated u/s 7/11 "mild dz, no stenosis"  . Colitis   . Dyslipidemia   . Food allergy    mushrooms  . Gallstones   . Hyperlipidemia   . Hypertension   . IBS (irritable bowel syndrome)   . LBP (low back pain) 3/11   after a local injection, f/u [facility]  . Normal cardiac stress test 04/2009  . Osteopenia   . Seasonal allergies   . 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 Wears hearing aid     Past Surgical History:  Procedure Laterality Date  . BUNIONECTOMY Bilateral 03/2013  . CARPAL TUNNEL RELEASE Right   . CATARACT EXTRACTION Bilateral 2020   Dr.Beavis  . CHOLECYSTECTOMY    . TOTAL SHOULDER ARTHROPLASTY Left 04/2016  . TRIGGER FINGER RELEASE  03/31/2012   Procedure: RELEASE TRIGGER FINGER/A-1 PULLEY;  Surgeon: Wyn Forster., MD;  Location: [facility];  Service: Orthopedics;  Laterality: Right;  right index   Family History  Problem Relation Age of Onset  . Hypertension Father   . Other Father        cerebral hemorrhage  . Coronary artery disease Mother 97  . Stroke Mother   . Diabetes Mother   . Breast cancer Mother   . Hypertension Mother   . Hypertension Sister   . Breast cancer Maternal Aunt        x 2  . Breast cancer Paternal Aunt   . Colon cancer Neg Hx     Allergies as of 12/22/2019      Reactions   Mushroom Extract Complex Shortness Of Breath   Shellfish Allergy Anaphylaxis   Oxycodone Itching, Other (See Comments)   "skin crawling" "skin crawling"   Egg White  [albumen, Egg] Other (See Comments)    Only eats egg whites. Egg yolk causes nausea. Only eats egg whites   Eggs Or Egg-derived Products Other (See Comments)   Only eats egg whites. Egg yolk causes nausea. Per patient   Other Other (See Comments)   High blood pressure   Pseudoeph-doxylamine-dm-apap Other (See Comments)   High blood pressure.   Pseudoeph-doxylamine-dm-apap    High blood pressure   Tape Rash, Other (See Comments)   Patient states use paper tape only. All other tapes cause rash. Patient states use paper tape only. All other tapes cause rash. Patient states use paper tape only. All other tapes cause rash. Patient states use paper tape only. All other tapes cause rash.      Medication List       Accurate as of December 22, 2019 11:59 PM. If you have any questions, ask your nurse or doctor.        STOP taking these medications   fluconazole 150 MG tablet Commonly known as: DIFLUCAN Stopped by: Willow Ora, MD     TAKE these medications   ALLEGRA ALLERGY PO Take 1 tablet by mouth daily.   amLODipine-benazepril 5-10 MG capsule Commonly known as: LOTREL  Take 1 capsule by mouth daily.   aspirin EC 81 MG tablet Take 81 mg by mouth daily.   Benefiber Powd Take 1 tsp by mouth three times a day with meals   clobetasol cream 0.05 % Commonly known as: TEMOVATE Apply 1 application topically 2 (two) times daily. Apply to affected area   clonazePAM 0.5 MG tablet Commonly known as: KLONOPIN Take 1 tablet (0.5 mg total) by mouth 4 (four) times daily as needed for anxiety. What changed: when to take this Changed by: [name], MD   diclofenac sodium 1 % Gel Commonly known as: VOLTAREN Apply 2 g topically 4 (four) times daily as needed.   diphenoxylate-atropine 2.5-0.025 MG tablet Commonly known as: Lomotil Take 1 tablet by mouth every 8 (eight) hours as needed for diarrhea or loose stools.   DRY EYES OP Place 2 drops into both eyes as needed (for dry eyes).   finasteride 5 MG tablet Commonly known  as: PROSCAR Take 1 tablet (5 mg total) by mouth daily.   fluticasone 50 MCG/ACT nasal spray Commonly known as: FLONASE Place 2 sprays into both nostrils daily.   lidocaine-hydrocortisone 3-0.5 % Crea Commonly known as: ANAMANTEL HC Place 1 Applicatorful rectally 2 (two) times daily. As needed   multivitamin capsule Take 1 capsule by mouth daily.   polyethylene glycol powder 17 GM/SCOOP powder Commonly known as: GLYCOLAX/MIRALAX Take 8.5 g by mouth daily.   raloxifene 60 MG tablet Commonly known as: EVISTA Take 1 tablet (60 mg total) by mouth daily.   simvastatin 20 MG tablet Commonly known as: ZOCOR Take 1 tablet (20 mg total) by mouth at bedtime.          Objective:   Physical Exam BP (!) 141/65 (BP Location: Left Arm, Patient Position: Sitting, Cuff Size: Small)   Pulse 71   Temp (!) 97 F (36.1 C) (Temporal)   Resp 16   Ht 5\' 4"  (1.626 m)   Wt 132 lb 4 oz (60 kg)   SpO2 98%   BMI 22.70 kg/m  General: Well developed, NAD, BMI noted Neck: No  thyromegaly  HEENT:  Normocephalic . Face symmetric, atraumatic Breasts: Bilateral breasts with no dominant nodule,  skin normal, no axillary LAD's, nipple with no discharge Lungs:  CTA B Normal respiratory effort, no intercostal retractions, no accessory muscle use. Heart: RRR,  no murmur.  Abdomen:  Not distended, soft, non-tender. No rebound or rigidity.   Lower extremities: no pretibial edema bilaterally  Skin: Exposed areas without rash. Not pale. Not jaundice Neurologic:  alert & oriented X3.  Speech normal, gait appropriate for age and unassisted Strength symmetric and appropriate for age.  Psych: Cognition and judgment appear intact.  Cooperative with normal attention span and concentration.  Behavior appropriate. No anxious or depressed appearing.     Assessment     Assessment HTN Hyperlipidemia Osteopenia  Per dexa 2011,  2014 (Tt score  - 1.7), and 11/2016. On Evista d/t FH breast ca Anxiety--  on clonazepam prn Seasonal (and food?) allergies Alopecia Female pattern, lichen planopilaris; saw derm 04-2017   HOH, hearing aids MSK: --Back DJD and spinal stenosis per MRI 06-2017 --s/p Shoulder replacement H/o Syncope, 2010:   --Nl  stress test, Holter: Atrial tachycardia --Carotid ultrasound 2010,2011 , 2014 no significant stenosis +FH CAD, mother age 16 + FH Breast ca- mother, sister x2, aunt --- on Evista  GI: -IBS, saw GI 08/03/2018 -H/o blood in stools, Cscope 2009, (-) except for hemorrhoids  -Admitted colitis  10-2016  PLAN: Here for CPX HTN: BP today 141/65, recommend to check at home, continue with Lotrel. Hyperlipidemia: On simvastatin, checking labs Osteopenia: Last DEXA 2018, on Evista, she is active, encouraged vitamin D supplements. Anxiety: Not well controlled, very anxious about COVID-19, taking clonazepam 3 times a day with relatively good results but not able to sleep well.  Increase clonazepam to 4 times a day, the last 2 doses to be taken at bedtime.  She will let me know if that is not helping. RTC 4 months      This visit occurred during the SARS-CoV-2 public health emergency.  Safety protocols were in place, including screening questions prior to the visit, additional usage of staff PPE, and extensive cleaning of exam room while observing appropriate contact time as indicated for disinfecting solutions.

## 2019-12-22 NOTE — Patient Instructions (Signed)
GO TO THE LAB : Get the blood work     GO TO THE FRONT DESK Come back for a checkup in 4 months, please make an appointment  Check your blood pressures weekly BP GOAL is between 110/65 and  135/85. If it is consistently higher or lower, let me know  You can take clonazepam 4 times a day, the last 2 pills  at bedtime  Take vitamin D 3 over-the-counter: 2000 units daily

## 2019-12-22 NOTE — Progress Notes (Signed)
Pre visit review using our clinic review tool, if applicable. No additional management support is needed unless otherwise documented below in the visit note. 

## 2019-12-23 NOTE — Assessment & Plan Note (Signed)
Here for CPX HTN: BP today 141/65, recommend to check at home, continue with Lotrel. Hyperlipidemia: On simvastatin, checking labs Osteopenia: Last DEXA 2018, on Evista, she is active, encouraged vitamin D supplements. Anxiety: Not well controlled, very anxious about COVID-19, taking clonazepam 3 times a day with relatively good results but not able to sleep well.  Increase clonazepam to 4 times a day, the last 2 doses to be taken at bedtime.  She will let me know if that is not helping. RTC 4 months

## 2019-12-23 NOTE — Assessment & Plan Note (Signed)
-  Td 5-10.  Hold booster until her Covid vaccination is completed -PNM 13: 05-2019 PNM 23: 11-2019 (had a local reaction)  -Shingles immunization, declined it before - s/p Covid vaccination x 1  -Female care: Sees gyn, dx Lichen Sclerosis; no further PAPs unless pt so desires  - Strong FH breast ca, pt reports previous  genetic testing: negative; MMG scheduled, clinical breast exam today normal -Colonoscopy:  04/18/2008, hemorrhoids. Cscope 12-2016 (done for colitis): no plyps -Palpable Ao: [place] neg for AAA 6-10 -diet-exercise: Doing well  -Labs: CMP, FLP, CBC, UDS.

## 2019-12-24 ENCOUNTER — Other Ambulatory Visit: Payer: Self-pay | Admitting: Internal Medicine

## 2019-12-25 LAB — PAIN MGMT, PROFILE 8 W/CONF, U
6 Acetylmorphine: NEGATIVE ng/mL
Alcohol Metabolites: POSITIVE ng/mL — AB (ref <500)
Amphetamines: NEGATIVE ng/mL
Benzodiazepines: NEGATIVE ng/mL
Buprenorphine, Urine: NEGATIVE ng/mL
Cocaine Metabolite: NEGATIVE ng/mL
Creatinine: 59.9 mg/dL
Ethyl Glucuronide (ETG): 1210 ng/mL
Ethyl Sulfate (ETS): 268 ng/mL
MDMA: NEGATIVE ng/mL
Marijuana Metabolite: NEGATIVE ng/mL
Opiates: NEGATIVE ng/mL
Oxidant: NEGATIVE ug/mL
Oxycodone: NEGATIVE ng/mL
pH: 7.1 (ref 4.5–9.0)

## 2019-12-27 ENCOUNTER — Other Ambulatory Visit: Payer: Self-pay | Admitting: Internal Medicine

## 2020-01-25 ENCOUNTER — Ambulatory Visit: Payer: Medicare PPO | Admitting: Family Medicine

## 2020-01-25 ENCOUNTER — Ambulatory Visit: Payer: Self-pay

## 2020-01-25 ENCOUNTER — Encounter: Payer: Self-pay | Admitting: Family Medicine

## 2020-01-25 ENCOUNTER — Ambulatory Visit (HOSPITAL_BASED_OUTPATIENT_CLINIC_OR_DEPARTMENT_OTHER)
Admission: RE | Admit: 2020-01-25 | Discharge: 2020-01-25 | Disposition: A | Discharge status: Home / Self Care | Payer: Medicare PPO | Source: Ambulatory Visit | Attending: Family Medicine | Admitting: Family Medicine

## 2020-01-25 ENCOUNTER — Other Ambulatory Visit: Payer: Self-pay

## 2020-01-25 VITALS — BP 142/84 | HR 65 | Ht 64.0 in | Wt 130.0 lb

## 2020-01-25 DIAGNOSIS — M1711 Unilateral primary osteoarthritis, right knee: Secondary | ICD-10-CM | POA: Diagnosis not present

## 2020-01-25 DIAGNOSIS — M65311 Trigger thumb, right thumb: Secondary | ICD-10-CM | POA: Diagnosis not present

## 2020-01-25 DIAGNOSIS — M25561 Pain in right knee: Secondary | ICD-10-CM | POA: Diagnosis not present

## 2020-01-25 MED ORDER — TRIAMCINOLONE ACETONIDE 40 MG/ML IJ SUSP
40.0000 mg | Freq: Once | INTRAMUSCULAR | Status: AC
  Administered 2020-01-25: 40 mg via INTRA_ARTICULAR

## 2020-01-25 NOTE — Progress Notes (Signed)
Linda Jefferson - 74 y.o. female MRN [id number]  Date of birth: 05/16/1946  SUBJECTIVE:  Including CC & ROS.  Chief Complaint  Patient presents with  . Follow-up    follow up for right thumb and right knee    Linda Jefferson is a 74 y.o. female that is presenting with right thumb pain and right knee pain.  These are acute on chronic in nature.  She has had a trigger thumb before.  She has received injections that seem to improve the pain previously.  Also presenting with right knee pain.  She has a history of pseudogout.  Has received injections in the past.  Also started hurting recently.  Denies any injury or trauma.   Review of Systems See HPI   HISTORY: Past Medical, Surgical, Social, and Family History Reviewed & Updated per EMR.   Pertinent Historical Findings include:  Past Medical History:  Diagnosis Date  . Anxiety   . Carotid artery disease ([place])    per u/s 01/2009- repeated u/s 7/11 "mild dz, no stenosis"  . Colitis   . Dyslipidemia   . Food allergy    mushrooms  . Gallstones   . Hyperlipidemia   . Hypertension   . IBS (irritable bowel syndrome)   . LBP (low back pain) 3/11   after a local injection, f/u [facility]  . Normal cardiac stress test 04/2009  . Osteopenia   . Seasonal allergies   . 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 Wears hearing aid     Past Surgical History:  Procedure Laterality Date  . BUNIONECTOMY Bilateral 03/2013  . CARPAL TUNNEL RELEASE Right   . CATARACT EXTRACTION Bilateral 2020   Dr.Beavis  . CHOLECYSTECTOMY    . TOTAL SHOULDER ARTHROPLASTY Left 04/2016  . TRIGGER FINGER RELEASE  03/31/2012   Procedure: RELEASE TRIGGER FINGER/A-1 PULLEY;  Surgeon: Cammie Sickle., MD;  Location: [place];  Service: Orthopedics;  Laterality: Right;  right index    Family History  Problem Relation Age of Onset  . Hypertension Father   . Other Father        cerebral hemorrhage  . Coronary artery disease Mother  6  . Stroke Mother   . Diabetes Mother   . Breast cancer Mother   . Hypertension Mother   . Hypertension Sister   . Breast cancer Maternal Aunt        x 2  . Breast cancer Paternal Aunt   . Colon cancer Neg Hx     Social History   Socioeconomic History  . Marital status: Married    Spouse name: Not on file  . Number of children: 3  . Years of education: Not on file  . Highest education level: Not on file  Occupational History  . Occupation: TEACHER-- retired     Fish farm manager: [employer]  Tobacco Use  . Smoking status: Never Smoker  . Smokeless tobacco: Never Used  Substance and Sexual Activity  . Alcohol use: Yes    Alcohol/week: 2.0 standard drinks    Types: 2 Glasses of wine per week    Comment: socially wine 4-5x/week  . Drug use: No  . Sexual activity: Not Currently  Other Topics Concern  . Not on file  Social History Narrative   Household: pt and husband   3 children, one in [place] (PhD)               Social Determinants  of Health   Financial Resource Strain: Low Risk   . Difficulty of Paying Living Expenses: Not hard at all  Food Insecurity: No Food Insecurity  . Worried About Programme researcher, broadcasting/film/video in the Last Year: Never true  . Ran Out of Food in the Last Year: Never true  Transportation Needs: No Transportation Needs  . Lack of Transportation (Medical): No  . Lack of Transportation (Non-Medical): No  Physical Activity:   . Days of Exercise per Week:   . Minutes of Exercise per Session:   Stress:   . Feeling of Stress :   Social Connections:   . Frequency of Communication with Friends and Family:   . Frequency of Social Gatherings with Friends and Family:   . Attends Religious Services:   . Active Member of Clubs or Organizations:   . Attends Banker Meetings:   Marland Kitchen Marital Status:   Intimate Partner Violence:   . Fear of Current or Ex-Partner:   . Emotionally Abused:   Marland Kitchen Physically Abused:   . Sexually Abused:       PHYSICAL EXAM:  VS: BP (!) 142/84   Pulse 65   Ht 5\' 4"  (1.626 m)   Wt 130 lb (59 kg)   BMI 22.31 kg/m  Physical Exam Gen: NAD, alert, cooperative with exam, well-appearing MSK:  Right hand: Triggering of the thumb evident. Tenderness palpation over the palmar flexor tendon. Normal flexion and extensions of the thumb. Right knee: Mild effusion. Instability with valgus varus stress testing. Normal strength resistance. Negative McMurray's test. Neurovascular intact   Aspiration/Injection Procedure Note Linda Jefferson [birth year]  Procedure: Injection Indications: Right knee pain  Procedure Details Consent: Risks of procedure as well as the alternatives and risks of each were explained to the (patient/caregiver).  Consent for procedure obtained. Time Out: Verified patient identification, verified procedure, site/side was marked, verified correct patient position, special equipment/implants available, medications/allergies/relevent history reviewed, required imaging and test results available.  Performed.  The area was cleaned with iodine and alcohol swabs.    The right knee superior lateral suprapatellar pouch was injected using 1 cc's of 40 mg Kenalog and 4 cc's of 0.25% bupivacaine with a 22 1 1/2" needle.  Ultrasound was used. Images were obtained in long views showing the injection.     A sterile dressing was applied.  Patient did tolerate procedure well.    ASSESSMENT & PLAN:   Trigger thumb of right hand Has started triggering again.  - counseled on splinting  - counseled on supportive care - can inject again.   Primary osteoarthritis of right knee Acute on chronic in nature. No inciting event. Reports history of pseudogout  - injection  -Counseled on home exercise therapy and supportive care. -X-rays. -Could consider gel injections or physical therapy.

## 2020-01-25 NOTE — Patient Instructions (Signed)
Good to see you Please continue the ice  Please try the exercises  I will call with the xray results.   Please send me a message in MyChart with any questions or updates.  Please see me back in 4 weeks.   --Dr. Jordan Likes

## 2020-01-26 ENCOUNTER — Telehealth: Payer: Self-pay | Admitting: Family Medicine

## 2020-01-26 DIAGNOSIS — M1711 Unilateral primary osteoarthritis, right knee: Secondary | ICD-10-CM | POA: Insufficient documentation

## 2020-01-26 NOTE — Assessment & Plan Note (Signed)
Has started triggering again.  - counseled on splinting  - counseled on supportive care - can inject again.

## 2020-01-26 NOTE — Telephone Encounter (Signed)
Left VM for patient. If she calls back please have her speak with a nurse/CMA and inform that her shows the changes of pseudogout .   If any questions then please take the best time and phone number to call and I will try to call her back.   Myra Rude, MD [facility] 01/26/2020, 10:41 AM

## 2020-01-26 NOTE — Assessment & Plan Note (Signed)
Acute on chronic in nature. No inciting event. Reports history of pseudogout  - injection  -Counseled on home exercise therapy and supportive care. -X-rays. -Could consider gel injections or physical therapy.

## 2020-01-27 ENCOUNTER — Telehealth: Payer: Self-pay | Admitting: Family Medicine

## 2020-01-27 NOTE — Telephone Encounter (Signed)
Spoke to patient and gave result information as provided by physician. 

## 2020-01-27 NOTE — Telephone Encounter (Signed)
Patient is returning call for results. 

## 2020-02-16 DIAGNOSIS — Z1231 Encounter for screening mammogram for malignant neoplasm of breast: Secondary | ICD-10-CM | POA: Diagnosis not present

## 2020-02-16 LAB — HM MAMMOGRAPHY

## 2020-02-17 ENCOUNTER — Encounter: Payer: Self-pay | Admitting: Internal Medicine

## 2020-02-22 ENCOUNTER — Encounter: Payer: Self-pay | Admitting: Family Medicine

## 2020-02-22 ENCOUNTER — Other Ambulatory Visit: Payer: Self-pay

## 2020-02-22 ENCOUNTER — Ambulatory Visit: Payer: Medicare PPO | Admitting: Family Medicine

## 2020-02-22 DIAGNOSIS — M1711 Unilateral primary osteoarthritis, right knee: Secondary | ICD-10-CM

## 2020-02-22 DIAGNOSIS — M65311 Trigger thumb, right thumb: Secondary | ICD-10-CM | POA: Diagnosis not present

## 2020-02-22 NOTE — Assessment & Plan Note (Signed)
Triggering is evident but less severe.  She still using a brace. -Continue with brace. -Counseled on supportive care. -Can perform injection at a point.

## 2020-02-22 NOTE — Assessment & Plan Note (Signed)
Limited improvement with steroid injection recently. -Counseled on exercise therapy and supportive care. -Try gel injections. -Could consider physical therapy.

## 2020-02-22 NOTE — Patient Instructions (Signed)
Good to see you Please try the pennsaid  Please try ice   Please send me a message in MyChart with any questions or updates.  We will call once the gel injection is in.   --Dr. Jordan Likes

## 2020-02-22 NOTE — Progress Notes (Signed)
Medication Samples have been provided to the patient.  Drug name: Pennsaid       Strength: 2%        Qty: 2 Boxes  LOT: [id number]  Exp.Date: 10/2020  Dosing instructions: Use a pea size amount and rub gently.  The patient has been instructed regarding the correct time, dose, and frequency of taking this medication, including desired effects and most common side effects.   Kathi Simpers, [place] 9:38 AM 02/22/2020

## 2020-02-22 NOTE — Progress Notes (Signed)
Linda Jefferson - 74 y.o. female MRN [id number]  Date of birth: Jan 11, 1946  SUBJECTIVE:  Including CC & ROS.  Chief Complaint  Patient presents with  . Follow-up    right knee    Linda Jefferson is a 74 y.o. female that is following up for her right knee pain and her trigger thumb.  Received little improvement with previous steroid injection of the knee.  Pain is still occurring over the lateral aspect.  She is made worse with walking.  Triggering is still occurring.   Review of Systems See HPI   HISTORY: Past Medical, Surgical, Social, and Family History Reviewed & Updated per EMR.   Pertinent Historical Findings include:  Past Medical History:  Diagnosis Date  . Anxiety   . Carotid artery disease (HCC)    per u/s 01/2009- repeated u/s 7/11 "mild dz, no stenosis"  . Colitis   . Dyslipidemia   . Food allergy    mushrooms  . Gallstones   . Hyperlipidemia   . Hypertension   . IBS (irritable bowel syndrome)   . LBP (low back pain) 3/11   after a local injection, f/u [facility]  . Normal cardiac stress test 04/2009  . Osteopenia   . Seasonal allergies   . 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 Wears hearing aid     Past Surgical History:  Procedure Laterality Date  . BUNIONECTOMY Bilateral 03/2013  . CARPAL TUNNEL RELEASE Right   . CATARACT EXTRACTION Bilateral 2020   Dr.Beavis  . CHOLECYSTECTOMY    . TOTAL SHOULDER ARTHROPLASTY Left 04/2016  . TRIGGER FINGER RELEASE  03/31/2012   Procedure: RELEASE TRIGGER FINGER/A-1 PULLEY;  Surgeon: Wyn Forster., MD;  Location: [facility];  Service: Orthopedics;  Laterality: Right;  right index    Family History  Problem Relation Age of Onset  . Hypertension Father   . Other Father        cerebral hemorrhage  . Coronary artery disease Mother 34  . Stroke Mother   . Diabetes Mother   . Breast cancer Mother   . Hypertension Mother   . Hypertension Sister   . Breast cancer Maternal Aunt       x 2  . Breast cancer Paternal Aunt   . Colon cancer Neg Hx     Social History   Socioeconomic History  . Marital status: Married    Spouse name: Not on file  . Number of children: 3  . Years of education: Not on file  . Highest education level: Not on file  Occupational History  . 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 Smoking status: Never Smoker  . Smokeless tobacco: Never Used  Substance and Sexual Activity  . Alcohol use: Yes    Alcohol/week: 2.0 standard drinks    Types: 2 Glasses of wine per week    Comment: socially wine 4-5x/week  . Drug use: No  . Sexual activity: Not Currently  Other Topics Concern  . Not on file  Social History Narrative   Household: pt and husband   3 children, one in [place] (PhD)               Social Determinants of Health   Financial Resource Strain: Low Risk   . Difficulty of Paying Living Expenses: Not hard at all  Food Insecurity: No Food Insecurity  . Worried About [employer] of  Food in the Last Year: Never true  . Ran Out of Food in the Last Year: Never true  Transportation Needs: No Transportation Needs  . Lack of Transportation (Medical): No  . Lack of Transportation (Non-Medical): No  Physical Activity:   . Days of Exercise per Week:   . Minutes of Exercise per Session:   Stress:   . Feeling of Stress :   Social Connections:   . Frequency of Communication with Friends and Family:   . Frequency of Social Gatherings with Friends and Family:   . Attends Religious Services:   . Active Member of Clubs or Organizations:   . Attends Archivist Meetings:   Marland Kitchen Marital Status:   Intimate Partner Violence:   . Fear of Current or Ex-Partner:   . Emotionally Abused:   Marland Kitchen Physically Abused:   . Sexually Abused:      PHYSICAL EXAM:  VS: BP (!) 155/84   Pulse 67   Ht 5\' 4"  (1.626 m)   Wt 130 lb (59 kg)   BMI 22.31 kg/m  Physical Exam Gen: NAD, alert,  cooperative with exam, well-appearing MSK:  Right knee: Has good range of motion. Normal strength resistance. Neurovascularly intact     ASSESSMENT & PLAN:   Trigger thumb of right hand Triggering is evident but less severe.  She still using a brace. -Continue with brace. -Counseled on supportive care. -Can perform injection at a point.  Primary osteoarthritis of right knee Limited improvement with steroid injection recently. -Counseled on exercise therapy and supportive care. -Try gel injections. -Could consider physical therapy.

## 2020-02-25 ENCOUNTER — Encounter: Payer: Self-pay | Admitting: Family Medicine

## 2020-02-28 ENCOUNTER — Encounter: Payer: Self-pay | Admitting: Internal Medicine

## 2020-02-28 ENCOUNTER — Ambulatory Visit: Payer: Medicare PPO | Admitting: Internal Medicine

## 2020-02-28 ENCOUNTER — Other Ambulatory Visit: Payer: Self-pay

## 2020-02-28 VITALS — BP 129/83 | HR 74 | Temp 97.5°F | Resp 18 | Ht 64.0 in | Wt 127.2 lb

## 2020-02-28 DIAGNOSIS — M11261 Other chondrocalcinosis, right knee: Secondary | ICD-10-CM | POA: Diagnosis not present

## 2020-02-28 NOTE — Patient Instructions (Addendum)
We are referring you to rheumatology  Until then continue using the knee sleeve, Tylenol and apply ice twice a day

## 2020-02-28 NOTE — Progress Notes (Signed)
Subjective:    Patient ID: Linda Jefferson, female    DOB: 08/08/1946, 74 y.o.   MRN: [id number]  DOS:  02/28/2020 Type of visit - description: Acute Here for evaluation of right knee pain. She was seen by sports medicine already, they did an aspiration and x-ray. X-ray results below. Patient continue with pain and request a rheumatology referral.  Pseudo gout?  X-ray report 1. Small knee joint effusion with possible tiny focus of nondependent fat or gas which could reflect an underlying intra-articular fracture or be iatrogenic if there has been recent injection or aspiration. 2. Possible tiny avulsion fracture of the proximal tibia at the medial tibial plateau. 3. Chondrocalcinosis.   Review of Systems She denies major pain at the small joints of the hand and wrist. She does have a trigger finger.  Past Medical History:  Diagnosis Date  . Anxiety   . Carotid artery disease ([place])    per u/s 01/2009- repeated u/s 7/11 "mild dz, no stenosis"  . Colitis   . Dyslipidemia   . Food allergy    mushrooms  . Gallstones   . Hyperlipidemia   . Hypertension   . IBS (irritable bowel syndrome)   . LBP (low back pain) 3/11   after a local injection, f/u [facility]  . Normal cardiac stress test 04/2009  . Osteopenia   . Seasonal allergies   . 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 Wears hearing aid     Past Surgical History:  Procedure Laterality Date  . BUNIONECTOMY Bilateral 03/2013  . CARPAL TUNNEL RELEASE Right   . CATARACT EXTRACTION Bilateral 2020   Dr.Beavis  . CHOLECYSTECTOMY    . TOTAL SHOULDER ARTHROPLASTY Left 04/2016  . TRIGGER FINGER RELEASE  03/31/2012   Procedure: RELEASE TRIGGER FINGER/A-1 PULLEY;  Surgeon: Cammie Sickle., MD;  Location: [facility];  Service: Orthopedics;  Laterality: Right;  right index    Allergies as of 02/28/2020      Reactions   Mushroom Extract Complex Shortness Of Breath   Shellfish Allergy  Anaphylaxis   Oxycodone Itching, Other (See Comments)   "skin crawling" "skin crawling"   Eggs Or Egg-derived Products Other (See Comments)   Only eats egg whites. Egg yolk causes nausea. Per patient   Pseudoeph-doxylamine-dm-apap    High blood pressure   Tape Rash, Other (See Comments)   Patient states use paper tape only. All other tapes cause rash. Patient states use paper tape only. All other tapes cause rash. Patient states use paper tape only. All other tapes cause rash. Patient states use paper tape only. All other tapes cause rash.      Medication List       Accurate as of Feb 28, 2020 11:59 PM. If you have any questions, ask your nurse or doctor.        ALLEGRA ALLERGY PO Take 1 tablet by mouth daily.   amLODipine-benazepril 5-10 MG capsule Commonly known as: LOTREL Take 1 capsule by mouth daily.   aspirin EC 81 MG tablet Take 81 mg by mouth daily.   Benefiber Powd Take 1 tsp by mouth three times a day with meals   clobetasol cream 0.05 % Commonly known as: TEMOVATE Apply 1 application topically 2 (two) times daily. Apply to affected area   clonazePAM 0.5 MG tablet Commonly known as: KLONOPIN Take 1 tablet (0.5 mg total) by mouth 4 (four) times daily as needed for anxiety.   diclofenac sodium  1 % Gel Commonly known as: VOLTAREN Apply 2 g topically 4 (four) times daily as needed.   diphenoxylate-atropine 2.5-0.025 MG tablet Commonly known as: Lomotil Take 1 tablet by mouth every 8 (eight) hours as needed for diarrhea or loose stools.   DRY EYES OP Place 2 drops into both eyes as needed (for dry eyes).   finasteride 5 MG tablet Commonly known as: PROSCAR Take 1 tablet (5 mg total) by mouth daily.   fluticasone 50 MCG/ACT nasal spray Commonly known as: FLONASE Place 2 sprays into both nostrils daily.   lidocaine-hydrocortisone 3-0.5 % Crea Commonly known as: ANAMANTEL HC Place 1 Applicatorful rectally 2 (two) times daily. As needed    multivitamin capsule Take 1 capsule by mouth daily.   polyethylene glycol powder 17 GM/SCOOP powder Commonly known as: GLYCOLAX/MIRALAX Take 8.5 g by mouth daily.   raloxifene 60 MG tablet Commonly known as: EVISTA Take 1 tablet (60 mg total) by mouth daily.   simvastatin 20 MG tablet Commonly known as: ZOCOR Take 1 tablet (20 mg total) by mouth at bedtime.          Objective:   Physical Exam BP 129/83 (BP Location: Left Arm, Patient Position: Sitting, Cuff Size: Small)   Pulse 74   Temp (!) 97.5 F (36.4 C) (Temporal)   Resp 18   Ht 5\' 4"  (1.626 m)   Wt 127 lb 4 oz (57.7 kg)   SpO2 97%   BMI 21.84 kg/m  General:   Well developed, NAD, BMI noted. HEENT:  Normocephalic . Face symmetric, atraumatic MSK: Left knee: Bony deformities consistent with DJD, no effusion Right knee: Minimal if any effusion, range of motion slightly limited, did not tolerate well forced flexion.  Not warm or red. Hands and wrists: No synovitis Neurologic:  alert & oriented X3.  Speech normal, gait appropriate for age and unassisted Psych--  Cognition and judgment appear intact.  Cooperative with normal attention span and concentration.  Behavior appropriate. No anxious or depressed appearing.      Assessment      Assessment HTN Hyperlipidemia Osteopenia  Per dexa 2011,  2014 (Tt score  - 1.7), and 11/2016. On Evista d/t FH breast ca Anxiety-- on clonazepam prn Seasonal (and food?) allergies Alopecia Female pattern, lichen planopilaris; saw derm 04-2017   HOH, hearing aids MSK: --Back DJD and spinal stenosis per MRI 06-2017 --s/p Shoulder replacement H/o Syncope, 2010:   --Nl  stress test, Holter: Atrial tachycardia --Carotid ultrasound 2010,2011 , 2014 no significant stenosis +FH CAD, mother age 74 + FH Breast ca- mother, sister x2, aunt --- on Evista  GI: -IBS, saw GI 08/03/2018 -H/o blood in stools, Cscope 2009, (-) except for hemorrhoids  -Admitted colitis  10-2016  PLAN: Right knee pain: Started a while back, recently seen by sports medicine, they order x-ray with show chondrocalcinosis and possibly avulsion fracture. The patient request a referral to rheumatology for possible pseudogout.  Will arrange.    This visit occurred during the SARS-CoV-2 public health emergency.  Safety protocols were in place, including screening questions prior to the visit, additional usage of staff PPE, and extensive cleaning of exam room while observing appropriate contact time as indicated for disinfecting solutions.

## 2020-02-28 NOTE — Progress Notes (Signed)
Pre visit review using our clinic review tool, if applicable. No additional management support is needed unless otherwise documented below in the visit note. 

## 2020-02-29 NOTE — Assessment & Plan Note (Signed)
Right knee pain: Started a while back, recently seen by sports medicine, they order x-ray with show chondrocalcinosis and possibly avulsion fracture. The patient request a referral to rheumatology for possible pseudogout.  Will arrange.

## 2020-03-01 ENCOUNTER — Telehealth: Payer: Self-pay

## 2020-03-01 ENCOUNTER — Encounter: Payer: Self-pay | Admitting: Internal Medicine

## 2020-03-01 DIAGNOSIS — M11261 Other chondrocalcinosis, right knee: Secondary | ICD-10-CM

## 2020-03-01 NOTE — Telephone Encounter (Signed)
Will try Dr. Dierdre Forth. Referral placed.

## 2020-03-01 NOTE — Telephone Encounter (Signed)
Pt wanted to let Dr. Drue Novel know the earliest she could be seen at Rheumatology was the end of September.  She stated Dr. Drue Novel may have some other options for her as far as Rheumatolgists to perhaps get her in sooner with.

## 2020-03-16 ENCOUNTER — Other Ambulatory Visit: Payer: Self-pay

## 2020-03-16 ENCOUNTER — Telehealth: Payer: Self-pay | Admitting: Gastroenterology

## 2020-03-16 MED ORDER — DIPHENOXYLATE-ATROPINE 2.5-0.025 MG PO TABS: 1.0000 | ORAL_TABLET | Freq: Three times a day (TID) | ORAL | 1 refills | Status: DC | PRN

## 2020-03-16 NOTE — Telephone Encounter (Signed)
Ok to send Lomotil refill to last until follow up appt. Please schedule follow up appt next available with me or APP. Thanks

## 2020-03-16 NOTE — Telephone Encounter (Signed)
Last seen 08/03/2018. She has been doing well until the last couple of weeks. She has resumed her usual routine to make her symptoms improve, however the diarrhea seems to be set off very easily. She cannot eat plain food without diarrhea. She is resuming the Bene fiber and triturating based on her response. Same with Miralax. She would like a refill on Lomotil to have on hand. Please advise.

## 2020-03-16 NOTE — Telephone Encounter (Signed)
Patient is agreeable to this plan. Appointment made. Refill to the [facility] her choice. Rx had to be faxed due to it being a CII

## 2020-03-28 ENCOUNTER — Encounter: Payer: Self-pay | Admitting: Internal Medicine

## 2020-03-28 DIAGNOSIS — Z6822 Body mass index (BMI) 22.0-22.9, adult: Secondary | ICD-10-CM | POA: Diagnosis not present

## 2020-03-28 DIAGNOSIS — M25461 Effusion, right knee: Secondary | ICD-10-CM | POA: Diagnosis not present

## 2020-03-28 DIAGNOSIS — M1120 Other chondrocalcinosis, unspecified site: Secondary | ICD-10-CM | POA: Diagnosis not present

## 2020-03-28 LAB — POCT ERYTHROCYTE SEDIMENTATION RATE, NON-AUTOMATED: Sed Rate: 7

## 2020-03-30 ENCOUNTER — Ambulatory Visit: Payer: Medicare PPO | Admitting: Obstetrics & Gynecology

## 2020-03-30 ENCOUNTER — Encounter: Payer: Self-pay | Admitting: Obstetrics & Gynecology

## 2020-03-30 ENCOUNTER — Other Ambulatory Visit: Payer: Self-pay

## 2020-03-30 VITALS — BP 151/64 | HR 79 | Wt 129.0 lb

## 2020-03-30 DIAGNOSIS — N904 Leukoplakia of vulva: Secondary | ICD-10-CM | POA: Diagnosis not present

## 2020-03-30 NOTE — Progress Notes (Signed)
History:  74 y.o. [id number] here today for f/u of lichen sclerosis. Pt reports that she is no longer having itching. She did not wean to 1x/day because of habit. She is still using the cream twice a day.    The following portions of the patient's history were reviewed and updated as appropriate: allergies, current medications, past family history, past medical history, past social history, past surgical history and problem list.  Review of Systems:  Pertinent items are noted in HPI.    Objective:  Physical Exam Blood pressure (!) 151/64, pulse 79, weight 129 lb (58.5 kg).  CONSTITUTIONAL: Well-developed, well-nourished female in no acute distress.  HENT:  Normocephalic, atraumatic EYES: Conjunctivae and EOM are normal. No scleral icterus.  NECK: Normal range of motion SKIN: Skin is warm and dry. No rash noted. Not diaphoretic.No pallor. NEUROLGIC: Alert and oriented to person, place, and time. Normal coordination.   Pelvic: Normal atrophic appearing external genitalia; the site of the biopsy has some pigmentation (tattooing)  from the silver nitrate. This is unchanged from post biopsy. No new lesions noted. The hypopigmentation has resolved.     Assessment & Plan:  Linda Jefferson was seen today for follow-up.  Diagnoses and all orders for this visit:  Lichen sclerosus -     clobetasol cream (TEMOVATE) 0.05 %; Apply 1 application topically to affected area. Wean to 1x/ day for 1 month followed by 3x/ week for 3 months then follow up a  f/u in 4 months or sooner prn  Linda Jefferson, M.D., [facility]

## 2020-03-30 NOTE — Patient Instructions (Signed)
Please use the Clobetasol cream once a day for 1 month followed by 3 times per week for 3 months.

## 2020-03-30 NOTE — Progress Notes (Signed)
Patient still using temovate (clobetasol) twice a day. Armandina Stammer RN

## 2020-03-31 ENCOUNTER — Ambulatory Visit: Payer: Medicare PPO | Admitting: Gastroenterology

## 2020-04-11 ENCOUNTER — Encounter: Payer: Self-pay | Admitting: Internal Medicine

## 2020-04-11 NOTE — Telephone Encounter (Signed)
Clonazepam refill.   Last OV: 02/28/2020  Last Fill: 12/22/2019 #120 and 1RF Pt sig: 1 tab qid prn UDS: 12/22/2019 Moderate risk

## 2020-04-13 MED ORDER — CLONAZEPAM 0.5 MG PO TABS: 0.5000 mg | ORAL_TABLET | Freq: Four times a day (QID) | ORAL | 0 refills | Status: DC | PRN

## 2020-04-13 NOTE — Telephone Encounter (Signed)
PDMP reviewed, prescription sent, UDS at the time of the next appointment 04/24/2020

## 2020-04-24 ENCOUNTER — Encounter: Payer: Self-pay | Admitting: Internal Medicine

## 2020-04-24 ENCOUNTER — Other Ambulatory Visit: Payer: Self-pay

## 2020-04-24 ENCOUNTER — Ambulatory Visit: Payer: Medicare PPO | Admitting: Internal Medicine

## 2020-04-24 VITALS — BP 144/76 | HR 63 | Temp 98.1°F | Resp 18 | Ht 64.0 in | Wt 132.4 lb

## 2020-04-24 DIAGNOSIS — I10 Essential (primary) hypertension: Secondary | ICD-10-CM

## 2020-04-24 DIAGNOSIS — F419 Anxiety disorder, unspecified: Secondary | ICD-10-CM | POA: Diagnosis not present

## 2020-04-24 DIAGNOSIS — Z79899 Other long term (current) drug therapy: Secondary | ICD-10-CM | POA: Diagnosis not present

## 2020-04-24 LAB — BASIC METABOLIC PANEL
BUN: 16 mg/dL (ref 6–23)
CO2: 31 mEq/L (ref 19–32)
Calcium: 10 mg/dL (ref 8.4–10.5)
Chloride: 101 mEq/L (ref 96–112)
Creatinine, Ser: 0.75 mg/dL (ref 0.40–1.20)
GFR: 75.3 mL/min (ref >60.00)
Glucose, Bld: 91 mg/dL (ref 70–99)
Potassium: 4.6 mEq/L (ref 3.5–5.1)
Sodium: 137 mEq/L (ref 135–145)

## 2020-04-24 NOTE — Progress Notes (Signed)
Subjective:    Patient ID: Linda Jefferson, female    DOB: 10-18-1945, 74 y.o.   MRN: [id number]  DOS:  04/24/2020 Type of visit - description: Routine visit Since the last office visit he is doing well, we talk about insomnia, hypertension, her recent visit to rheumatology. BP today slt elevated, ambulatory BPs ~120s, 130s.  BP Readings from Last 3 Encounters:  04/24/20 (!) 144/76  03/30/20 (!) 151/64  02/28/20 129/83     Review of Systems No other concerns  Past Medical History:  Diagnosis Date  . Anxiety   . Carotid artery disease ([facility])    per u/s 01/2009- repeated u/s 7/11 "mild dz, no stenosis"  . Colitis   . Dyslipidemia   . Food allergy    mushrooms  . Gallstones   . Hyperlipidemia   . Hypertension   . IBS (irritable bowel syndrome)   . LBP (low back pain) 3/11   after a local injection, f/u [facility]  . Normal cardiac stress test 04/2009  . Osteopenia   . Seasonal allergies   . 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 Wears hearing aid     Past Surgical History:  Procedure Laterality Date  . BUNIONECTOMY Bilateral 03/2013  . CARPAL TUNNEL RELEASE Right   . CATARACT EXTRACTION Bilateral 2020   Dr.Beavis  . CHOLECYSTECTOMY    . TOTAL SHOULDER ARTHROPLASTY Left 04/2016  . TRIGGER FINGER RELEASE  03/31/2012   Procedure: RELEASE TRIGGER FINGER/A-1 PULLEY;  Surgeon: Wyn Forster., MD;  Location: [facility];  Service: Orthopedics;  Laterality: Right;  right index    Social History   Socioeconomic History  . Marital status: Married    Spouse name: Not on file  . Number of children: 3  . Years of education: Not on file  . Highest education level: Not on file  Occupational History  . 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 Smoking status: Never Smoker  . Smokeless tobacco: Never Used  Vaping Use  . Vaping Use: Never used  Substance and Sexual Activity  . Alcohol use: Yes     Alcohol/week: 2.0 standard drinks    Types: 2 Glasses of wine per week    Comment: socially wine 4-5x/week  . Drug use: No  . Sexual activity: Not Currently  Other Topics Concern  . Not on file  Social History Narrative   Household: pt and husband   3 children, one in [place] (PhD)               Social Determinants of Health   Financial Resource Strain: Low Risk   . Difficulty of Paying Living Expenses: Not hard at all  Food Insecurity: No Food Insecurity  . Worried About Programme researcher, broadcasting/film/video in the Last Year: Never true  . Ran Out of Food in the Last Year: Never true  Transportation Needs: No Transportation Needs  . Lack of Transportation (Medical): No  . Lack of Transportation (Non-Medical): No  Physical Activity:   . Days of Exercise per Week:   . Minutes of Exercise per Session:   Stress:   . Feeling of Stress :   Social Connections:   . Frequency of Communication with Friends and Family:   . Frequency of Social Gatherings with Friends and Family:   . Attends Religious Services:   . Active Member of Clubs or Organizations:   . Attends  Club or Organization Meetings:   Marland Kitchen Marital Status:   Intimate Partner Violence:   . Fear of Current or Ex-Partner:   . Emotionally Abused:   Marland Kitchen Physically Abused:   . Sexually Abused:       Allergies as of 04/24/2020      Reactions   Mushroom Extract Complex Shortness Of Breath   Shellfish Allergy Anaphylaxis   Oxycodone Itching, Other (See Comments)   "skin crawling" "skin crawling"   Eggs Or Egg-derived Products Other (See Comments)   Only eats egg whites. Egg yolk causes nausea. Per patient   Pseudoeph-doxylamine-dm-apap    High blood pressure   Tape Rash, Other (See Comments)   Patient states use paper tape only. All other tapes cause rash. Patient states use paper tape only. All other tapes cause rash. Patient states use paper tape only. All other tapes cause rash. Patient states use paper tape only. All other  tapes cause rash.      Medication List       Accurate as of April 24, 2020 10:21 PM. If you have any questions, ask your nurse or doctor.        amLODipine-benazepril 5-10 MG capsule Commonly known as: LOTREL Take 1 capsule by mouth daily.   aspirin EC 81 MG tablet Take 81 mg by mouth daily.   Benefiber Powd Take 1 tsp by mouth three times a day with meals   clobetasol cream 0.05 % Commonly known as: TEMOVATE Apply 1 application topically 2 (two) times daily. Apply to affected area   clonazePAM 0.5 MG tablet Commonly known as: KLONOPIN Take 1 tablet (0.5 mg total) by mouth 4 (four) times daily as needed for anxiety.   diclofenac sodium 1 % Gel Commonly known as: VOLTAREN Apply 2 g topically 4 (four) times daily as needed.   diphenoxylate-atropine 2.5-0.025 MG tablet Commonly known as: Lomotil Take 1 tablet by mouth every 8 (eight) hours as needed for diarrhea or loose stools.   DRY EYES OP Place 2 drops into both eyes as needed (for dry eyes).   finasteride 5 MG tablet Commonly known as: PROSCAR Take 1 tablet (5 mg total) by mouth daily.   lidocaine-hydrocortisone 3-0.5 % Crea Commonly known as: ANAMANTEL HC Place 1 Applicatorful rectally 2 (two) times daily. As needed   multivitamin capsule Take 1 capsule by mouth daily.   polyethylene glycol powder 17 GM/SCOOP powder Commonly known as: GLYCOLAX/MIRALAX Take 8.5 g by mouth daily.   raloxifene 60 MG tablet Commonly known as: EVISTA Take 1 tablet (60 mg total) by mouth daily.   simvastatin 20 MG tablet Commonly known as: ZOCOR Take 1 tablet (20 mg total) by mouth at bedtime.          Objective:   Physical Exam BP (!) 144/76 (BP Location: Left Arm, Patient Position: Sitting, Cuff Size: Small)   Pulse 63   Temp 98.1 F (36.7 C) (Oral)   Resp 18   Ht 5\' 4"  (1.626 m)   Wt 132 lb 6 oz (60 kg)   SpO2 97%   BMI 22.72 kg/m   General:   Well developed, NAD, BMI noted. HEENT:  Normocephalic . Face  symmetric, atraumatic Lungs:  CTA B Normal respiratory effort, no intercostal retractions, no accessory muscle use. Heart: RRR,  no murmur.  Lower extremities: no pretibial edema bilaterally  Skin: Not pale. Not jaundice Neurologic:  alert & oriented X3.  Speech normal, gait appropriate for age and unassisted Psych--  Cognition and judgment appear  intact.  Cooperative with normal attention span and concentration.  Behavior appropriate. No anxious or depressed appearing.      Assessment     Assessment HTN Hyperlipidemia Osteopenia  Per dexa 2011,  2014 (Tt score  - 1.7), and 11/2016. On Evista d/t FH breast ca Anxiety, insomnia -- on clonazepam prn Seasonal (and food?) allergies Alopecia Female pattern, lichen planopilaris; saw derm 04-2017   HOH, hearing aids MSK: --Back DJD and spinal stenosis per MRI 06-2017 --s/p Shoulder replacement H/o Syncope, 2010:   --Nl  stress test, Holter: Atrial tachycardia --Carotid ultrasound 2010,2011 , 2014 no significant stenosis +FH CAD, mother age 19 + FH Breast ca- mother, sister x2, aunt --- on Evista  GI: -IBS, saw GI 08/03/2018 -H/o blood in stools, Cscope 2009, (-) except for hemorrhoids  -Admitted colitis 10-2016 Lichen planus DX gynecology 03/2020  PLAN: HTN:BP today slightly elevated, at home typically in the 120s, 130s.  Continue Lotrel, check a BMP. Anxiety, insomnia: Since the last visit,  taking melatonin and two clonazepam qhs prn  with very good results.  Check a UDS, RF as needed R knee pain , pseudogout Was seen by  rheumatology 03/28/2020 due to right knee swelling, extensive labs done, had a  a local injection, was told dx is pseudo gout; sxs resolved RTC 8 months CPX.   This visit occurred during the SARS-CoV-2 public health emergency.  Safety protocols were in place, including screening questions prior to the visit, additional usage of staff PPE, and extensive cleaning of exam room while observing appropriate contact  time as indicated for disinfecting solutions.

## 2020-04-24 NOTE — Progress Notes (Signed)
Pre visit review using our clinic review tool, if applicable. No additional management support is needed unless otherwise documented below in the visit note. 

## 2020-04-24 NOTE — Patient Instructions (Signed)
Check the  blood pressure regulalrly  BP GOAL is between 110/65 and  135/85. If it is consistently higher or lower, let me know   GO TO THE LAB : Get the blood work     GO TO THE FRONT DESK, PLEASE SCHEDULE YOUR APPOINTMENTS Come back for a physical by 12/2020

## 2020-04-24 NOTE — Assessment & Plan Note (Signed)
HTN:BP today slightly elevated, at home typically in the 120s, 130s.  Continue Lotrel, check a BMP. Anxiety, insomnia: Since the last visit,  taking melatonin and two clonazepam qhs prn  with very good results.  Check a UDS, RF as needed R knee pain , pseudogout Was seen by  rheumatology 03/28/2020 due to right knee swelling, extensive labs done, had a  a local injection, was told dx is pseudo gout; sxs resolved RTC 8 months CPX.

## 2020-04-26 LAB — DRUG MONITORING, PANEL 8 WITH CONFIRMATION, URINE
6 Acetylmorphine: NEGATIVE ng/mL (ref <10)
Alcohol Metabolites: NEGATIVE ng/mL
Alphahydroxyalprazolam: NEGATIVE ng/mL (ref <25)
Alphahydroxymidazolam: NEGATIVE ng/mL (ref <50)
Alphahydroxytriazolam: NEGATIVE ng/mL (ref <50)
Aminoclonazepam: 200 ng/mL — ABNORMAL HIGH (ref <25)
Amphetamines: NEGATIVE ng/mL (ref <500)
Benzodiazepines: POSITIVE ng/mL — AB (ref <100)
Buprenorphine, Urine: NEGATIVE ng/mL (ref <5)
Cocaine Metabolite: NEGATIVE ng/mL (ref <150)
Creatinine: 41.4 mg/dL
Hydroxyethylflurazepam: NEGATIVE ng/mL (ref <50)
Lorazepam: NEGATIVE ng/mL (ref <50)
MDMA: NEGATIVE ng/mL (ref <500)
Marijuana Metabolite: NEGATIVE ng/mL (ref <20)
Nordiazepam: NEGATIVE ng/mL (ref <50)
Opiates: NEGATIVE ng/mL (ref <100)
Oxazepam: NEGATIVE ng/mL (ref <50)
Oxidant: NEGATIVE ug/mL
Oxycodone: NEGATIVE ng/mL (ref <100)
Temazepam: NEGATIVE ng/mL (ref <50)
pH: 5.9 (ref 4.5–9.0)

## 2020-04-26 LAB — DM TEMPLATE

## 2020-05-03 ENCOUNTER — Encounter: Payer: Self-pay | Admitting: Internal Medicine

## 2020-05-18 ENCOUNTER — Ambulatory Visit: Payer: Medicare PPO | Admitting: Gastroenterology

## 2020-05-18 ENCOUNTER — Encounter: Payer: Self-pay | Admitting: Gastroenterology

## 2020-05-18 VITALS — BP 100/60 | HR 57 | Ht 64.0 in | Wt 130.1 lb

## 2020-05-18 DIAGNOSIS — K582 Mixed irritable bowel syndrome: Secondary | ICD-10-CM | POA: Diagnosis not present

## 2020-05-18 DIAGNOSIS — K580 Irritable bowel syndrome with diarrhea: Secondary | ICD-10-CM | POA: Diagnosis not present

## 2020-05-18 MED ORDER — HYOSCYAMINE SULFATE 0.125 MG SL SUBL
0.1250 mg | SUBLINGUAL_TABLET | Freq: Three times a day (TID) | SUBLINGUAL | 1 refills | Status: DC | PRN
Start: 2020-05-18 — End: 2020-06-13

## 2020-05-18 NOTE — Patient Instructions (Addendum)
Discontinue Lomotil  We have sent the following medications to your pharmacy for you to pick up at your convenience:  Levsin  Please follow up in 2 years

## 2020-05-18 NOTE — Progress Notes (Signed)
Linda Jefferson    [id number]    [birth year]  Primary Care Physician:Paz, Nolon Rod, MD  Referring Physician: Wanda Plump, MD [address] [place],  [place] [address]   Chief complaint:  IBS  HPI: 74 year old very pleasant female here for follow-up visit for chronic irritable bowel syndrome with alternating constipation and diarrhea She is currently off most of her medications.  Takes Lomotil as needed for intermittent diarrhea Unable to identify any triggers for the episodes, feels dairy products may be causing some episodes diarrhea but she was not sure. She recently got a new puppy is not getting enough sleep.  She is also struggling with depression.  She is worried about her husband, he is having health problems.  She is trying to eat more fruits and vegetables, increase water intake Denies any nausea, vomiting, abdominal pain, melena or bright red blood per rectum  Colonoscopy March 2018 showed small internal hemorrhoids otherwise normal exam  Outpatient Encounter Medications as of 05/18/2020  Medication Sig  . amLODipine-benazepril (LOTREL) 5-10 MG capsule Take 1 capsule by mouth daily.  . Artificial Tear Ointment (DRY EYES OP) Place 2 drops into both eyes as needed (for dry eyes).  Marland Kitchen aspirin EC 81 MG tablet Take 81 mg by mouth daily.  . clobetasol cream (TEMOVATE) 0.05 % Apply 1 application topically 2 (two) times daily. Apply to affected area  . clonazePAM (KLONOPIN) 0.5 MG tablet Take 1 tablet (0.5 mg total) by mouth 4 (four) times daily as needed for anxiety.  . diclofenac sodium (VOLTAREN) 1 % GEL Apply 2 g topically 4 (four) times daily as needed.  . diphenoxylate-atropine (LOMOTIL) 2.5-0.025 MG tablet Take 1 tablet by mouth every 8 (eight) hours as needed for diarrhea or loose stools.  . finasteride (PROSCAR) 5 MG tablet Take 1 tablet (5 mg total) by mouth daily.  Marland Kitchen lidocaine-hydrocortisone (ANAMANTEL HC) 3-0.5 % CREA Place 1 Applicatorful  rectally 2 (two) times daily. As needed  . Multiple Vitamin (MULTIVITAMIN) capsule Take 1 capsule by mouth daily.    . polyethylene glycol powder (GLYCOLAX/MIRALAX) powder Take 8.5 g by mouth daily.   . raloxifene (EVISTA) 60 MG tablet Take 1 tablet (60 mg total) by mouth daily.  . simvastatin (ZOCOR) 20 MG tablet Take 1 tablet (20 mg total) by mouth at bedtime.  . Wheat Dextrin (BENEFIBER) POWD Take 1 tsp by mouth three times a day with meals   No facility-administered encounter medications on file as of 05/18/2020.    Allergies as of 05/18/2020 - Review Complete 05/18/2020  Allergen Reaction Noted  . Mushroom extract complex Shortness Of Breath 08/09/2015  . Shellfish allergy Anaphylaxis 08/03/2014  . Oxycodone Itching and Other (See Comments) 05/17/2016  . Eggs or egg-derived products Other (See Comments) 09/02/2012  . Pseudoeph-doxylamine-dm-apap  04/05/2016  . Tape Rash and Other (See Comments) 04/05/2016    Past Medical History:  Diagnosis Date  . Anxiety   . Carotid artery disease (HCC)    per u/s 01/2009- repeated u/s 7/11 "mild dz, no stenosis"  . Colitis   . Dyslipidemia   . Food allergy    mushrooms  . Gallstones   . Hyperlipidemia   . Hypertension   . IBS (irritable bowel syndrome)   . LBP (low back pain) 3/11   after a local injection, f/u [facility]  . Normal cardiac stress test 04/2009  . Osteopenia   . Seasonal allergies   .  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 Wears hearing aid     Past Surgical History:  Procedure Laterality Date  . BUNIONECTOMY Bilateral 03/2013  . CARPAL TUNNEL RELEASE Right   . CATARACT EXTRACTION Bilateral 2020   Dr.Beavis  . CHOLECYSTECTOMY    . TOTAL SHOULDER ARTHROPLASTY Left 04/2016  . TRIGGER FINGER RELEASE  03/31/2012   Procedure: RELEASE TRIGGER FINGER/A-1 PULLEY;  Surgeon: Wyn Forster., MD;  Location: [facility];  Service: Orthopedics;  Laterality: Right;  right index    Family  History  Problem Relation Age of Onset  . Hypertension Father   . Other Father        cerebral hemorrhage  . Coronary artery disease Mother 12  . Stroke Mother   . Diabetes Mother   . Breast cancer Mother   . Hypertension Mother   . Hypertension Sister   . Breast cancer Maternal Aunt        x 2  . Breast cancer Paternal Aunt   . Colon cancer Neg Hx     Social History   Socioeconomic History  . Marital status: Married    Spouse name: Not on file  . Number of children: 3  . Years of education: Not on file  . Highest education level: Not on file  Occupational History  . 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 Smoking status: Never Smoker  . Smokeless tobacco: Never Used  Vaping Use  . Vaping Use: Never used  Substance and Sexual Activity  . Alcohol use: Yes    Alcohol/week: 2.0 standard drinks    Types: 2 Glasses of wine per week    Comment: socially wine 4-5x/week  . Drug use: No  . Sexual activity: Not Currently  Other Topics Concern  . Not on file  Social History Narrative   Household: pt and husband   3 children, one in [place] (PhD)               Social Determinants of Health   Financial Resource Strain: Low Risk   . Difficulty of Paying Living Expenses: Not hard at all  Food Insecurity: No Food Insecurity  . Worried About Programme researcher, broadcasting/film/video in the Last Year: Never true  . Ran Out of Food in the Last Year: Never true  Transportation Needs: No Transportation Needs  . Lack of Transportation (Medical): No  . Lack of Transportation (Non-Medical): No  Physical Activity:   . Days of Exercise per Week:   . Minutes of Exercise per Session:   Stress:   . Feeling of Stress :   Social Connections:   . Frequency of Communication with Friends and Family:   . Frequency of Social Gatherings with Friends and Family:   . Attends Religious Services:   . Active Member of Clubs or Organizations:   . Attends Tax inspector Meetings:   Marland Kitchen Marital Status:   Intimate Partner Violence:   . Fear of Current or Ex-Partner:   . Emotionally Abused:   Marland Kitchen Physically Abused:   . Sexually Abused:       Review of systems: All other review of systems negative except as mentioned in the HPI.   Physical Exam: Vitals:   05/18/20 0924  BP: 100/60  Pulse: (!) 57   Body mass index is 22.34 kg/m. Gen:      No acute distress Neuro: alert and oriented x  3 Psych: normal mood and affect  Data Reviewed:  Reviewed labs, radiology imaging, old records and pertinent past GI work up   Assessment and Plan/Recommendations:  74 year old very pleasant female with history of irritable bowel syndrome with alternating constipation and diarrhea  High-fiber diet with increased water intake We will do a trial of lactose-free diet for 1 to 2 weeks  We will switch Lomotil to Levsin 1 tablet up to 3 times daily as needed  Advised patient to call back if she is not having symptom relief with Levsin, can switch back to Lomotil  Return in 2 years or sooner if needed  The patient was provided an opportunity to ask questions and all were answered. The patient agreed with the plan and demonstrated an understanding of the instructions.  Iona Beard , MD    CC: Wanda Plump, MD

## 2020-05-25 DIAGNOSIS — L661 Lichen planopilaris: Secondary | ICD-10-CM | POA: Diagnosis not present

## 2020-05-25 DIAGNOSIS — L658 Other specified nonscarring hair loss: Secondary | ICD-10-CM | POA: Diagnosis not present

## 2020-05-26 ENCOUNTER — Encounter: Payer: Self-pay | Admitting: Gastroenterology

## 2020-05-31 ENCOUNTER — Encounter: Payer: Self-pay | Admitting: Internal Medicine

## 2020-05-31 ENCOUNTER — Other Ambulatory Visit: Payer: Self-pay | Admitting: Internal Medicine

## 2020-05-31 MED ORDER — CLONAZEPAM 0.5 MG PO TABS: 0.5000 mg | ORAL_TABLET | Freq: Four times a day (QID) | ORAL | 0 refills | Status: DC | PRN

## 2020-05-31 MED ORDER — CLONAZEPAM 0.5 MG PO TABS: 0.5000 mg | ORAL_TABLET | Freq: Every evening | ORAL | 3 refills | Status: DC | PRN

## 2020-06-10 ENCOUNTER — Other Ambulatory Visit: Payer: Self-pay | Admitting: Gastroenterology

## 2020-06-19 ENCOUNTER — Other Ambulatory Visit: Payer: Self-pay

## 2020-06-19 ENCOUNTER — Telehealth (INDEPENDENT_AMBULATORY_CARE_PROVIDER_SITE_OTHER): Payer: Medicare PPO | Admitting: Medical

## 2020-06-19 DIAGNOSIS — J3089 Other allergic rhinitis: Secondary | ICD-10-CM | POA: Diagnosis not present

## 2020-06-19 DIAGNOSIS — I10 Essential (primary) hypertension: Secondary | ICD-10-CM

## 2020-06-19 MED ORDER — MONTELUKAST SODIUM 10 MG PO TABS
10.0000 mg | ORAL_TABLET | Freq: Every day | ORAL | 0 refills | Status: DC
Start: 2020-06-19 — End: 2020-06-28

## 2020-06-19 NOTE — Progress Notes (Signed)
Subjective:    Patient ID: Linda Jefferson, female    DOB: [birth year], 74 y.o.   MRN: [id number]  HPI  Virtual Visit via Telephone Note  I connected with Linda Jefferson on 06/19/20 at  1:00 PM EDT by telephone and verified that I am speaking with the correct person using two identifiers.  Location: Patient: home Provider: office. Particpants.  Bp- 122/69. Pulse 83 temp 98.2   Video platform was not working.   I discussed the limitations, risks, security and privacy concerns of performing an evaluation and management service by telephone and the availability of in person appointments. I also discussed with the patient that there may be a patient responsible charge related to this service. The patient expressed understanding and agreed to proceed.   History of Present Illness: Pt states hx of allergies. Pt just got symptoms since this past friday. Pt states walking with her puppy recently. Pt states walking out doors with out her mask.   Pt states just now started to walk since it has been too hot. Just started walking on Friday as weather got coller.  Pt tried mucinex. Pt states just got claritin. In past states zyrtec and did not help. Pt states in past nasal spray astelin did not help/worked. Flonase has not been helping.  No fever, no chills, no sweats or body aches. No loss of smell. No change in taste.  Pt had negative rapid covid test today. Will repeat in 3 days. Fully vaccinated.     Observations/Objective: General- sounds mild nasal congested. No acute distress.  Lungs- no obvious labored breathing manner on interview. heent- no sinus pressure on self palpation.  Assessment and Plan:  You do appear to have recent allergic rhinitis signs symptoms are coincided with walking your puppy on Friday.  Symptoms seem some improved after using Claritin only.  Glad to hear that you are fully vaccinated and your rapid Covid test was negative.  Repeat Covid test in 3 days as  directed.  Since you do have persisting allergy type symptoms we will add montelukast.  Prior nasal sprays both Astelin and Flonase did not help much.  Considering possible Xyzal prescription if Claritin and montelukast not adequate.  If signs symptoms worsen or change let us know.  Follow-up in 7 to 10 days or as needed.  Esperanza Richters, PA-C Follow Up Instructions:    I discussed the assessment and treatment plan with the patient. The patient was provided an opportunity to ask questions and all were answered. The patient agreed with the plan and demonstrated an understanding of the instructions.   The patient was advised to call back or seek an in-person evaluation if the symptoms worsen or if the condition fails to improve as anticipated.  Time spent with patient today was 25  minutes which consisted of chart review, discussing diagnosis, work up, treatment and documentation.   Esperanza Richters, PA-C    Review of Systems  Constitutional: Negative for chills, fatigue and fever.  HENT: Positive for congestion, postnasal drip and sneezing. Negative for sore throat and tinnitus.   Respiratory: Negative for cough, chest tightness and wheezing.   Cardiovascular: Negative for chest pain and palpitations.  Gastrointestinal: Negative for abdominal pain.  Musculoskeletal: Negative for back pain.  Neurological: Negative for dizziness, numbness and headaches.  Hematological: Negative for adenopathy.  Psychiatric/Behavioral: Negative for behavioral problems and confusion.    Past Medical History:  Diagnosis Date  . Anxiety   . Carotid artery disease (HCC)  per u/s 01/2009- repeated u/s 7/11 "mild dz, no stenosis"  . Colitis   . Dyslipidemia   . Food allergy    mushrooms  . Gallstones   . Hyperlipidemia   . Hypertension   . IBS (irritable bowel syndrome)   . LBP (low back pain) 3/11   after a local injection, f/u [facility]  . Normal cardiac stress test 04/2009  . Osteopenia    . Seasonal allergies   . 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 Wears hearing aid      Social History   Socioeconomic History  . Marital status: Married    Spouse name: Not on file  . Number of children: 3  . Years of education: Not on file  . Highest education level: Not on file  Occupational History  . 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 Smoking status: Never Smoker  . Smokeless tobacco: Never Used  Vaping Use  . Vaping Use: Never used  Substance and Sexual Activity  . Alcohol use: Yes    Alcohol/week: 2.0 standard drinks    Types: 2 Glasses of wine per week    Comment: socially wine 4-5x/week  . Drug use: No  . Sexual activity: Not Currently  Other Topics Concern  . Not on file  Social History Narrative   Household: pt and husband   3 children, one in [place] (PhD)               Social Determinants of Health   Financial Resource Strain: Low Risk   . Difficulty of Paying Living Expenses: Not hard at all  Food Insecurity: No Food Insecurity  . Worried About Programme researcher, broadcasting/film/video in the Last Year: Never true  . Ran Out of Food in the Last Year: Never true  Transportation Needs: No Transportation Needs  . Lack of Transportation (Medical): No  . Lack of Transportation (Non-Medical): No  Physical Activity:   . Days of Exercise per Week: Not on file  . Minutes of Exercise per Session: Not on file  Stress:   . Feeling of Stress : Not on file  Social Connections:   . Frequency of Communication with Friends and Family: Not on file  . Frequency of Social Gatherings with Friends and Family: Not on file  . Attends Religious Services: Not on file  . Active Member of Clubs or Organizations: Not on file  . Attends Banker Meetings: Not on file  . Marital Status: Not on file  Intimate Partner Violence:   . Fear of Current or Ex-Partner: Not on file  . Emotionally Abused: Not on file  . Physically  Abused: Not on file  . Sexually Abused: Not on file    Past Surgical History:  Procedure Laterality Date  . BUNIONECTOMY Bilateral 03/2013  . CARPAL TUNNEL RELEASE Right   . CATARACT EXTRACTION Bilateral 2020   Dr.Beavis  . CHOLECYSTECTOMY    . TOTAL SHOULDER ARTHROPLASTY Left 04/2016  . TRIGGER FINGER RELEASE  03/31/2012   Procedure: RELEASE TRIGGER FINGER/A-1 PULLEY;  Surgeon: Wyn Forster., MD;  Location: [facility];  Service: Orthopedics;  Laterality: Right;  right index    Family History  Problem Relation Age of Onset  . Hypertension Father   . Other Father        cerebral hemorrhage  . Coronary artery disease Mother 62  . Stroke Mother   .  Diabetes Mother   . Breast cancer Mother   . Hypertension Mother   . Hypertension Sister   . Breast cancer Maternal Aunt        x 2  . Breast cancer Paternal Aunt   . Colon cancer Neg Hx     Allergies  Allergen Reactions  . Mushroom Extract Complex Shortness Of Breath  . Shellfish Allergy Anaphylaxis  . Oxycodone Itching and Other (See Comments)    "skin crawling" "skin crawling"  . Eggs Or Egg-Derived Products Other (See Comments)    Only eats egg whites. Egg yolk causes nausea. Per patient  . Pseudoeph-Doxylamine-Dm-Apap     High blood pressure  . Tape Rash and Other (See Comments)    Patient states use paper tape only. All other tapes cause rash. Patient states use paper tape only. All other tapes cause rash. Patient states use paper tape only. All other tapes cause rash. Patient states use paper tape only. All other tapes cause rash.    Current Outpatient Medications on File Prior to Visit  Medication Sig Dispense Refill  . amLODipine-benazepril (LOTREL) 5-10 MG capsule Take 1 capsule by mouth daily. 90 capsule 1  . Artificial Tear Ointment (DRY EYES OP) Place 2 drops into both eyes as needed (for dry eyes).    Marland Kitchen aspirin EC 81 MG tablet Take 81 mg by mouth daily.    . clobetasol cream  (TEMOVATE) 0.05 % Apply 1 application topically 2 (two) times daily. Apply to affected area 60 g 2  . clonazePAM (KLONOPIN) 0.5 MG tablet Take 1 tablet (0.5 mg total) by mouth 4 (four) times daily as needed for anxiety. 120 tablet 0  . diclofenac sodium (VOLTAREN) 1 % GEL Apply 2 g topically 4 (four) times daily as needed. 100 g 1  . diphenoxylate-atropine (LOMOTIL) 2.5-0.025 MG tablet Take 1 tablet by mouth every 8 (eight) hours as needed for diarrhea or loose stools. 90 tablet 1  . finasteride (PROSCAR) 5 MG tablet Take 1 tablet (5 mg total) by mouth daily. 90 tablet 3  . hyoscyamine (LEVSIN SL) 0.125 MG SL tablet PLACE 1 TABLET (0.125 MG TOTAL) UNDER THE TONGUE 3 (THREE) TIMES DAILY AS NEEDED. 90 tablet 1  . lidocaine-hydrocortisone (ANAMANTEL HC) 3-0.5 % CREA Place 1 Applicatorful rectally 2 (two) times daily. As needed 85 g 1  . Multiple Vitamin (MULTIVITAMIN) capsule Take 1 capsule by mouth daily.      . polyethylene glycol powder (GLYCOLAX/MIRALAX) powder Take 8.5 g by mouth daily.     . raloxifene (EVISTA) 60 MG tablet Take 1 tablet (60 mg total) by mouth daily. 90 tablet 3  . simvastatin (ZOCOR) 20 MG tablet Take 1 tablet (20 mg total) by mouth at bedtime. 90 tablet 1  . Wheat Dextrin (BENEFIBER) POWD Take 1 tsp by mouth three times a day with meals     No current facility-administered medications on file prior to visit.    There were no vitals taken for this visit.      Objective:   Physical Exam        Assessment & Plan:

## 2020-06-19 NOTE — Patient Instructions (Addendum)
You do appear to have recent allergic rhinitis signs symptoms that coincided with walking your puppy on Friday.  Symptoms seem some improved some after using Claritin only(but not completely).  Glad to hear that you are fully vaccinated and your rapid Covid test was negative.  Repeat Covid test in 3 days as directed.  Since you do have persisting allergy type symptoms we will add montelukast.  Prior nasal sprays both Astelin and Flonase did not help much.  Considering possible Xyzal prescription if Claritin and montelukast not adequate.  If signs symptoms worsen or change let us know.  Follow-up in 7 to 10 days or as needed.

## 2020-06-21 ENCOUNTER — Encounter: Payer: Self-pay | Admitting: Medical

## 2020-06-21 ENCOUNTER — Telehealth: Payer: Self-pay | Admitting: Medical

## 2020-06-21 MED ORDER — METHYLPREDNISOLONE 4 MG PO TABS
ORAL_TABLET | ORAL | 0 refills | Status: DC
Start: 2020-06-21 — End: 2020-07-11

## 2020-06-21 MED ORDER — BENZONATATE 200 MG PO CAPS: 200.0000 mg | ORAL_CAPSULE | Freq: Three times a day (TID) | ORAL | 0 refills | Status: DC | PRN

## 2020-06-21 NOTE — Telephone Encounter (Signed)
Medrol and benzonatate sent to pt pharmacy.

## 2020-06-23 ENCOUNTER — Other Ambulatory Visit: Payer: Self-pay | Admitting: Internal Medicine

## 2020-06-27 ENCOUNTER — Telehealth: Payer: Self-pay | Admitting: Internal Medicine

## 2020-06-27 NOTE — Telephone Encounter (Signed)
Patient is requesting a chest xray and blood test before seeing [employer]. Patient states she has not sleep in days. Patient states coughing and medication prescribed is not working.

## 2020-06-27 NOTE — Telephone Encounter (Signed)
Just now seeing this after hours as busy all day long and essentially worked thru lunch so. Will order appropiate labs and cxr. Pt can get done after visit.

## 2020-06-27 NOTE — Telephone Encounter (Signed)
Patient called to check the status.

## 2020-06-28 ENCOUNTER — Ambulatory Visit: Payer: Medicare PPO | Admitting: Rheumatology

## 2020-06-28 ENCOUNTER — Other Ambulatory Visit: Payer: Self-pay

## 2020-06-28 ENCOUNTER — Ambulatory Visit: Payer: Medicare PPO | Admitting: Medical

## 2020-06-28 ENCOUNTER — Ambulatory Visit (HOSPITAL_BASED_OUTPATIENT_CLINIC_OR_DEPARTMENT_OTHER)
Admission: RE | Admit: 2020-06-28 | Discharge: 2020-06-28 | Disposition: A | Discharge status: Home / Self Care | Payer: Medicare PPO | Source: Ambulatory Visit | Attending: Medical | Admitting: Medical

## 2020-06-28 VITALS — BP 157/74 | HR 65 | Temp 98.5°F | Resp 18 | Ht 64.0 in | Wt 127.0 lb

## 2020-06-28 DIAGNOSIS — R05 Cough: Secondary | ICD-10-CM | POA: Diagnosis not present

## 2020-06-28 DIAGNOSIS — J3089 Other allergic rhinitis: Secondary | ICD-10-CM | POA: Diagnosis not present

## 2020-06-28 DIAGNOSIS — R42 Dizziness and giddiness: Secondary | ICD-10-CM

## 2020-06-28 DIAGNOSIS — R059 Cough, unspecified: Secondary | ICD-10-CM

## 2020-06-28 DIAGNOSIS — R5383 Other fatigue: Secondary | ICD-10-CM

## 2020-06-28 LAB — CBC WITH DIFFERENTIAL/PLATELET
Absolute Monocytes: 560 cells/uL (ref 200–950)
Basophils Absolute: 22 cells/uL (ref 0–200)
Basophils Relative: 0.2 %
Eosinophils Absolute: 11 cells/uL — ABNORMAL LOW (ref 15–500)
Eosinophils Relative: 0.1 %
HCT: 39.2 % (ref 35.0–45.0)
Hemoglobin: 12.8 g/dL (ref 11.7–15.5)
Lymphs Abs: 1232 cells/uL (ref 850–3900)
MCH: 29.9 pg (ref 27.0–33.0)
MCHC: 32.7 g/dL (ref 32.0–36.0)
MCV: 91.6 fL (ref 80.0–100.0)
MPV: 10.9 fL (ref 7.5–12.5)
Monocytes Relative: 5 %
Neutro Abs: 9374 cells/uL — ABNORMAL HIGH (ref 1500–7800)
Neutrophils Relative %: 83.7 %
Platelets: 386 10*3/uL (ref 140–400)
RBC: 4.28 10*6/uL (ref 3.80–5.10)
RDW: 12.4 % (ref 11.0–15.0)
Total Lymphocyte: 11 %
WBC: 11.2 10*3/uL — ABNORMAL HIGH (ref 3.8–10.8)

## 2020-06-28 LAB — COMPREHENSIVE METABOLIC PANEL
AG Ratio: 1.7 (calc) (ref 1.0–2.5)
ALT: 14 U/L (ref 6–29)
AST: 21 U/L (ref 10–35)
Albumin: 4.3 g/dL (ref 3.6–5.1)
Alkaline phosphatase (APISO): 62 U/L (ref 37–153)
BUN: 20 mg/dL (ref 7–25)
CO2: 33 mmol/L — ABNORMAL HIGH (ref 20–32)
Calcium: 10 mg/dL (ref 8.6–10.4)
Chloride: 95 mmol/L — ABNORMAL LOW (ref 98–110)
Creat: 0.61 mg/dL (ref 0.60–0.93)
Globulin: 2.5 g/dL (calc) (ref 1.9–3.7)
Glucose, Bld: 88 mg/dL (ref 65–99)
Potassium: 4.7 mmol/L (ref 3.5–5.3)
Sodium: 133 mmol/L — ABNORMAL LOW (ref 135–146)
Total Bilirubin: 0.3 mg/dL (ref 0.2–1.2)
Total Protein: 6.8 g/dL (ref 6.1–8.1)

## 2020-06-28 MED ORDER — MONTELUKAST SODIUM 10 MG PO TABS: 10.0000 mg | ORAL_TABLET | Freq: Every day | ORAL | 0 refills | Status: DC

## 2020-06-28 MED ORDER — HYDROCODONE-HOMATROPINE 5-1.5 MG/5ML PO SYRP
5.0000 mL | ORAL_SOLUTION | Freq: Four times a day (QID) | ORAL | 0 refills | Status: DC | PRN
Start: 2020-06-28 — End: 2020-07-11

## 2020-06-28 NOTE — Progress Notes (Addendum)
Subjective:    Patient ID: Linda Jefferson, female    DOB: 30-Jun-1946, 74 y.o.   MRN: [id number]  HPI  Pt in for appointment.  I saw the patient last week.  Please see that note.  That visit was virtual.   Pt states she has not been able to sleep due to cough. Pt has history of allergic bronchitis in the past. Per husband. I thought mostly allergic rhinitis symptoms mostly last visit.  Cough started dry and getting little more productive.  No fever, no chills or sweats.  Husband states pt has had hyrocodone based products before. He states she needs for night time use so she can sleep.  I did find in epic she has been on both Norco and Hycodan in the past.  Pt states last night she finally slept well. Pt is on medrol taper dose on Sunday.   Last night was the first night that she was able to sleep.  Her and her husband indicate Medrol might of started working.  They will be finished with Medrol on Sunday per patient.  Recent 2 rapid Covid test came back negative.  Patient has been trying Claritin and I will prescribe montelukast.  In epic it looks like montelukast was sent successfully but pharmacy told patient never sent a prescription in.  Husband who is a retired Careers adviser did write her montelukast and she  tried the medication but they state did not help.  I got a a request yesterday in epic from patient and her husband that they wanted x-ray and labs done before todays visit so the results could be ready.  I was able to review this note last night after hours.  I did not place order until today.  No guarantee that x-ray would have been read for labs would have been back by the time patient's appointment was over.  Patient's husband and patient expressed numerous times that it was unreasonable for me not to have ordered the studies despite my explaining that I was very busy yesterday until the evening and simply did not have the time to order the test.   Review of Systems   Constitutional: Positive for fatigue. Negative for chills.       Slight minimal light headed which she attribulte to fatigue and not sleeping  HENT: Positive for postnasal drip. Negative for congestion, ear pain, sinus pressure and sore throat.   Respiratory: Positive for cough. Negative for chest tightness, shortness of breath and wheezing.   Cardiovascular: Negative for chest pain and palpitations.  Neurological: Negative for dizziness and headaches.  Hematological: Negative for adenopathy. Does not bruise/bleed easily.    Past Medical History:  Diagnosis Date  . Anxiety   . Carotid artery disease (HCC)    per u/s 01/2009- repeated u/s 7/11 "mild dz, no stenosis"  . Colitis   . Dyslipidemia   . Food allergy    mushrooms  . Gallstones   . Hyperlipidemia   . Hypertension   . IBS (irritable bowel syndrome)   . LBP (low back pain) 3/11   after a local injection, f/u [facility]  . Normal cardiac stress test 04/2009  . Osteopenia   . Seasonal allergies   . 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 Wears hearing aid      Social History   Socioeconomic History  . Marital status: Married    Spouse name: Not on file  . Number of children: 3  . Years  of education: Not on file  . Highest education level: Not on file  Occupational History  . Occupation: TEACHER-- retired     Associate Professormployer: [employer]  Tobacco Use  . Smoking status: Never Smoker  . Smokeless tobacco: Never Used  Vaping Use  . Vaping Use: Never used  Substance and Sexual Activity  . Alcohol use: Yes    Alcohol/week: 2.0 standard drinks    Types: 2 Glasses of wine per week    Comment: socially wine 4-5x/week  . Drug use: No  . Sexual activity: Not Currently  Other Topics Concern  . Not on file  Social History Narrative   Household: pt and husband   3 children, [place] in [place] (PhD)               Social Determinants of Health   Financial Resource Strain: Low Risk   . Difficulty  of Paying Living Expenses: Not hard at all  Food Insecurity: No Food Insecurity  . Worried About Programme researcher, broadcasting/film/videounning Out of Food in the Last Year: Never true  . Ran Out of Food in the Last Year: Never true  Transportation Needs: No Transportation Needs  . Lack of Transportation (Medical): No  . Lack of Transportation (Non-Medical): No  Physical Activity:   . Days of Exercise per Week: Not on file  . Minutes of Exercise per Session: Not on file  Stress:   . Feeling of Stress : Not on file  Social Connections:   . Frequency of Communication with Friends and Family: Not on file  . Frequency of Social Gatherings with Friends and Family: Not on file  . Attends Religious Services: Not on file  . Active Member of Clubs or Organizations: Not on file  . Attends BankerClub or Organization Meetings: Not on file  . Marital Status: Not on file  Intimate Partner Violence:   . Fear of Current or Ex-Partner: Not on file  . Emotionally Abused: Not on file  . Physically Abused: Not on file  . Sexually Abused: Not on file    Past Surgical History:  Procedure Laterality Date  . BUNIONECTOMY Bilateral 03/2013  . CARPAL TUNNEL RELEASE Right   . CATARACT EXTRACTION Bilateral 2020   Dr.Beavis  . CHOLECYSTECTOMY    . TOTAL SHOULDER ARTHROPLASTY Left 04/2016  . TRIGGER FINGER RELEASE  03/31/2012   Procedure: RELEASE TRIGGER FINGER/A-1 PULLEY;  Surgeon: Wyn Forsterobert V Sypher Jr., MD;  Location: [facility];  Service: Orthopedics;  Laterality: Right;  right index    Family History  Problem Relation Age of Onset  . Hypertension Father   . Other Father        cerebral hemorrhage  . Coronary artery disease Mother 650  . Stroke Mother   . Diabetes Mother   . Breast cancer Mother   . Hypertension Mother   . Hypertension Sister   . Breast cancer Maternal Aunt        x 2  . Breast cancer Paternal Aunt   . Colon cancer Neg Hx     Allergies  Allergen Reactions  . Mushroom Extract Complex Shortness Of Breath   . Shellfish Allergy Anaphylaxis  . Oxycodone Itching and Other (See Comments)    "skin crawling" "skin crawling"  . Eggs Or Egg-Derived Products Other (See Comments)    Only eats egg whites. Egg yolk causes nausea. Per patient  . Pseudoeph-Doxylamine-Dm-Apap     High blood pressure  . Tape Rash and Other (See Comments)  Patient states use paper tape only. All other tapes cause rash. Patient states use paper tape only. All other tapes cause rash. Patient states use paper tape only. All other tapes cause rash. Patient states use paper tape only. All other tapes cause rash.    Current Outpatient Medications on File Prior to Visit  Medication Sig Dispense Refill  . amLODipine-benazepril (LOTREL) 5-10 MG capsule Take 1 capsule by mouth daily. 90 capsule 1  . Artificial Tear Ointment (DRY EYES OP) Place 2 drops into both eyes as needed (for dry eyes).    Marland Kitchen aspirin EC 81 MG tablet Take 81 mg by mouth daily.    . benzonatate (TESSALON) 200 MG capsule Take 1 capsule (200 mg total) by mouth 3 (three) times daily as needed for cough. (Patient not taking: Reported on 06/28/2020) 21 capsule 0  . clobetasol cream (TEMOVATE) 0.05 % Apply 1 application topically 2 (two) times daily. Apply to affected area 60 g 2  . clonazePAM (KLONOPIN) 0.5 MG tablet Take 1 tablet (0.5 mg total) by mouth 4 (four) times daily as needed for anxiety. 120 tablet 0  . diclofenac sodium (VOLTAREN) 1 % GEL Apply 2 g topically 4 (four) times daily as needed. 100 g 1  . diphenoxylate-atropine (LOMOTIL) 2.5-0.025 MG tablet Take 1 tablet by mouth every 8 (eight) hours as needed for diarrhea or loose stools. 90 tablet 1  . finasteride (PROSCAR) 5 MG tablet Take 1 tablet (5 mg total) by mouth daily. 90 tablet 3  . hyoscyamine (LEVSIN SL) 0.125 MG SL tablet PLACE 1 TABLET (0.125 MG TOTAL) UNDER THE TONGUE 3 (THREE) TIMES DAILY AS NEEDED. (Patient not taking: Reported on 06/28/2020) 90 tablet 1  . lidocaine-hydrocortisone (ANAMANTEL  HC) 3-0.5 % CREA Place 1 Applicatorful rectally 2 (two) times daily. As needed 85 g 1  . methylPREDNISolone (MEDROL) 4 MG tablet Standard 6 day taper dose. (Patient not taking: Reported on 06/28/2020) 21 tablet 0  . Multiple Vitamin (MULTIVITAMIN) capsule Take 1 capsule by mouth daily.      . polyethylene glycol powder (GLYCOLAX/MIRALAX) powder Take 8.5 g by mouth daily.     . raloxifene (EVISTA) 60 MG tablet Take 1 tablet (60 mg total) by mouth daily. 90 tablet 3  . simvastatin (ZOCOR) 20 MG tablet Take 1 tablet (20 mg total) by mouth at bedtime. 90 tablet 1  . Wheat Dextrin (BENEFIBER) POWD Take 1 tsp by mouth three times a day with meals     No current facility-administered medications on file prior to visit.    BP (!) 157/74   Pulse 65   Temp 98.5 F (36.9 C) (Oral)   Resp 18   Ht 5\' 4"  (1.626 m)   Wt 127 lb (57.6 kg)   SpO2 94%   BMI 21.80 kg/m      Objective:   Physical Exam  .General  Mental Status - Alert. General Appearance - Well groomed. Not in acute distress.  Skin Rashes- No Rashes.  HEENT Head- Normal. Ear Auditory Canal - Left- Normal. Right - Normal.Tympanic Membrane- Left- Normal. Right- Normal. Eye Sclera/Conjunctiva- Left- Normal. Right- Normal. Nose & Sinuses Right-  .Bilateral no maxillary and no frontal sinus pressure. Mouth & Throat Lips: Upper Lip- Normal: no dryness, cracking, pallor, cyanosis, or vesicular eruption. Lower Lip-Normal: no dryness, cracking, pallor, cyanosis or vesicular eruption. Buccal Mucosa- Bilateral- No Aphthous ulcers. Oropharynx- No Discharge or Erythema. +pnd. Tonsils: Characteristics- Bilateral- No Erythema or Congestion. Size/Enlargement- Bilateral- No enlargement. Discharge- bilateral-None.  Neck  Neck- Supple. No Masses.   Chest and Lung Exam Auscultation: Breath Sounds:-Clear even and unlabored.  Cardiovascular Auscultation:Rythm- Regular, rate and rhythm. Murmurs & Other Heart Sounds:Ausculatation of the heart  reveal- No Murmurs.  Lymphatic Head & Neck General Head & Neck Lymphatics: Bilateral: Description- No Localized lymphadenopathy.  Neuro-cranial nerves III through XII grossly intact.     Assessment & Plan:  Recent allergic rhinitis with history of allergic bronchitis per her husband.  Patient's had very irritating cough despite treatment.  Over the last 24 hours some improvement as she appears to be in middle of Medrol Dosepak which I prescribed.  We will add Hycodan cough syrup to use.  Hopefully this will allow patient to continue to sleep well.  Rx advisement given and encouraged mainly only nighttime use.  Plan continue Claritin over-the-counter.  Went ahead and ordered a chest x-ray, CBC and CMP.   Follow-up in 7 to 10 days with PCP or as needed.  Sending note to PCP for review.  Lab not ordered as stat but did ask lab to call over and expidite the lab. Usually get result back within a day even without stat. Wanted to minimize wait time for pt.

## 2020-06-28 NOTE — Patient Instructions (Addendum)
Recent allergic rhinitis with history of allergic bronchitis per her husband.  Patient's had very irritating cough despite treatment.  Over the last 24 hours some improvement as she appears to be in middle of Medrol Dosepak which I prescribed.  We will add Hycodan cough syrup to use.  Hopefully this will allow patient to continue to sleep well.  Rx advisement given and encouraged mainly only nighttime use.  Plan continue Claritin over-the-counter.  Went ahead and ordered a chest x-ray, CBC and CMP.   Follow-up in 7 to 10 days with PCP or as needed.

## 2020-06-29 ENCOUNTER — Telehealth: Payer: Self-pay | Admitting: Medical

## 2020-06-29 DIAGNOSIS — E871 Hypo-osmolality and hyponatremia: Secondary | ICD-10-CM

## 2020-06-29 DIAGNOSIS — D72829 Elevated white blood cell count, unspecified: Secondary | ICD-10-CM

## 2020-06-29 NOTE — Telephone Encounter (Signed)
Future labs placed. Advised pt can schedule in advance to follow mild abnormalities

## 2020-07-07 ENCOUNTER — Ambulatory Visit: Payer: Medicare PPO

## 2020-07-11 ENCOUNTER — Encounter: Payer: Self-pay | Admitting: Internal Medicine

## 2020-07-11 ENCOUNTER — Other Ambulatory Visit: Payer: Self-pay

## 2020-07-11 ENCOUNTER — Ambulatory Visit: Payer: Medicare PPO | Admitting: Internal Medicine

## 2020-07-11 VITALS — BP 116/73 | HR 76 | Temp 98.1°F | Resp 16 | Ht 64.0 in | Wt 130.5 lb

## 2020-07-11 DIAGNOSIS — E871 Hypo-osmolality and hyponatremia: Secondary | ICD-10-CM

## 2020-07-11 DIAGNOSIS — J9801 Acute bronchospasm: Secondary | ICD-10-CM | POA: Diagnosis not present

## 2020-07-11 DIAGNOSIS — J302 Other seasonal allergic rhinitis: Secondary | ICD-10-CM

## 2020-07-11 MED ORDER — AZELASTINE HCL 0.1 % NA SOLN: 2.0000 | Freq: Two times a day (BID) | NASAL | 6 refills | Status: DC

## 2020-07-11 MED ORDER — BUDESONIDE-FORMOTEROL FUMARATE 160-4.5 MCG/ACT IN AERO: 2.0000 | INHALATION_SPRAY | Freq: Two times a day (BID) | RESPIRATORY_TRACT | 1 refills | Status: DC

## 2020-07-11 MED ORDER — HYDROCODONE-HOMATROPINE 5-1.5 MG/5ML PO SYRP
5.0000 mL | ORAL_SOLUTION | Freq: Every evening | ORAL | 0 refills | Status: DC | PRN
Start: 2020-07-11 — End: 2020-12-05

## 2020-07-11 MED ORDER — CLONAZEPAM 0.5 MG PO TABS: 0.5000 mg | ORAL_TABLET | Freq: Four times a day (QID) | ORAL | 0 refills | Status: DC | PRN

## 2020-07-11 NOTE — Progress Notes (Signed)
Subjective:    Patient ID: Linda Jefferson, female    DOB: [birth year], 74 y.o.   MRN: [id number]  DOS:  07/11/2020 Type of visit - description: Acute, here with her husband. Symptoms started June 14, 2020: She noted that whenever walked outside with her puppy she would get itchy eyes, runny nose and a dry cough.  Patient was seen by Marylene Buerger:.  Last visit was 06/28/2020. Since then, labs, chest x-ray and Covid test were done.  She is here because she continue with cough, it is episodic, severe, dry, worse at night.  No fever chills No sinus pain but ongoing postnasal dripping nasal congestion. No chest pain, no difficulty breathing, no palpitation. No current wheezing.   Review of Systems See above   Past Medical History:  Diagnosis Date  . Anxiety   . Carotid artery disease (HCC)    per u/s 01/2009- repeated u/s 7/11 "mild dz, no stenosis"  . Colitis   . Dyslipidemia   . Food allergy    mushrooms  . Gallstones   . Hyperlipidemia   . Hypertension   . IBS (irritable bowel syndrome)   . LBP (low back pain) 3/11   after a local injection, f/u [facility]  . Normal cardiac stress test 04/2009  . Osteopenia   . Seasonal allergies   . 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 Wears hearing aid     Past Surgical History:  Procedure Laterality Date  . BUNIONECTOMY Bilateral 03/2013  . CARPAL TUNNEL RELEASE Right   . CATARACT EXTRACTION Bilateral 2020   Dr.Beavis  . CHOLECYSTECTOMY    . TOTAL SHOULDER ARTHROPLASTY Left 04/2016  . TRIGGER FINGER RELEASE  03/31/2012   Procedure: RELEASE TRIGGER FINGER/A-1 PULLEY;  Surgeon: Wyn Forster., MD;  Location: [facility];  Service: Orthopedics;  Laterality: Right;  right index    Allergies as of 07/11/2020      Reactions   Mushroom Extract Complex Shortness Of Breath   Shellfish Allergy Anaphylaxis   Oxycodone Itching, Other (See Comments)   "skin crawling" "skin crawling"   Eggs Or  Egg-derived Products Other (See Comments)   Only eats egg whites. Egg yolk causes nausea. Per patient   Pseudoeph-doxylamine-dm-apap    High blood pressure   Tape Rash, Other (See Comments)   Patient states use paper tape only. All other tapes cause rash. Patient states use paper tape only. All other tapes cause rash. Patient states use paper tape only. All other tapes cause rash. Patient states use paper tape only. All other tapes cause rash.      Medication List       Accurate as of July 11, 2020 11:59 PM. If you have any questions, ask your nurse or doctor.        STOP taking these medications   benzonatate 200 MG capsule Commonly known as: TESSALON Stopped by: Willow Ora, MD   methylPREDNISolone 4 MG tablet Commonly known as: Medrol Stopped by: Willow Ora, MD   montelukast 10 MG tablet Commonly known as: SINGULAIR Stopped by: Willow Ora, MD     TAKE these medications   amLODipine-benazepril 5-10 MG capsule Commonly known as: LOTREL Take 1 capsule by mouth daily.   aspirin EC 81 MG tablet Take 81 mg by mouth daily.   azelastine 0.1 % nasal spray Commonly known as: ASTELIN Place 2 sprays into both nostrils 2 (two) times daily. Started by: Willow Ora, MD   Benefiber Powd  Take 1 tsp by mouth three times a day with meals   budesonide-formoterol 160-4.5 MCG/ACT inhaler Commonly known as: SYMBICORT Inhale 2 puffs into the lungs 2 (two) times daily. Started by: Willow Ora, MD   clobetasol cream 0.05 % Commonly known as: TEMOVATE Apply 1 application topically 2 (two) times daily. Apply to affected area   clonazePAM 0.5 MG tablet Commonly known as: KLONOPIN Take 1 tablet (0.5 mg total) by mouth 4 (four) times daily as needed for anxiety.   diclofenac sodium 1 % Gel Commonly known as: VOLTAREN Apply 2 g topically 4 (four) times daily as needed.   diphenoxylate-atropine 2.5-0.025 MG tablet Commonly known as: Lomotil Take 1 tablet by mouth every 8 (eight) hours as  needed for diarrhea or loose stools.   DRY EYES OP Place 2 drops into both eyes as needed (for dry eyes).   finasteride 5 MG tablet Commonly known as: PROSCAR Take 1 tablet (5 mg total) by mouth daily.   HYDROcodone-homatropine 5-1.5 MG/5ML syrup Commonly known as: HYCODAN Take 5 mLs by mouth at bedtime as needed for cough. What changed: when to take this Changed by: Willow Ora, MD   hyoscyamine 0.125 MG SL tablet Commonly known as: LEVSIN SL PLACE 1 TABLET (0.125 MG TOTAL) UNDER THE TONGUE 3 (THREE) TIMES DAILY AS NEEDED.   lidocaine-hydrocortisone 3-0.5 % Crea Commonly known as: ANAMANTEL HC Place 1 Applicatorful rectally 2 (two) times daily. As needed   multivitamin capsule Take 1 capsule by mouth daily.   polyethylene glycol powder 17 GM/SCOOP powder Commonly known as: GLYCOLAX/MIRALAX Take 8.5 g by mouth daily.   raloxifene 60 MG tablet Commonly known as: EVISTA Take 1 tablet (60 mg total) by mouth daily.   simvastatin 20 MG tablet Commonly known as: ZOCOR Take 1 tablet (20 mg total) by mouth at bedtime.          Objective:   Physical Exam BP 116/73 (BP Location: Left Arm, Patient Position: Sitting, Cuff Size: Small)   Pulse 76   Temp 98.1 F (36.7 C) (Oral)   Resp 16   Ht 5\' 4"  (1.626 m)   Wt 130 lb 8 oz (59.2 kg)   SpO2 94%   BMI 22.40 kg/m  General:   Well developed, NAD, BMI noted. HEENT:  Normocephalic . Face symmetric, atraumatic.  TMs normal.  Throat normal. Lungs:  CTA B, prolonged expiratory time with cough, no wheezing per se Normal respiratory effort, no intercostal retractions, no accessory muscle use. Heart: RRR,  no murmur.  Lower extremities: no pretibial edema bilaterally  Skin: Not pale. Not jaundice Neurologic:  alert & oriented X3.  Speech normal, gait appropriate for age and unassisted Psych--  Cognition and judgment appear intact.  Cooperative with normal attention span and concentration.  Behavior appropriate. No anxious  or depressed appearing.      Assessment     Assessment HTN Hyperlipidemia Osteopenia  Per dexa 2011,  2014 (Tt score  - 1.7), and 11/2016. On Evista d/t FH breast ca Anxiety, insomnia -- on clonazepam prn Seasonal (and food?) allergies Alopecia Female pattern, lichen planopilaris; saw derm 04-2017   HOH, hearing aids MSK: --Back DJD and spinal stenosis per MRI 06-2017 --s/p Shoulder replacement H/o Syncope, 2010:   --Nl  stress test, Holter: Atrial tachycardia --Carotid ultrasound 2010,2011 , 2014 no significant stenosis +FH CAD, mother age 63 + FH Breast ca- mother, sister x2, aunt --- on Evista  GI: -IBS, saw GI 08/03/2018 -H/o blood in stools, Cscope 2009, (-)  except for hemorrhoids  -Admitted colitis 10-2016 Lichen planus DX gynecology 03/2020  PLAN: Allergies, persistent cough. Symptoms started 06/14/2020, triggered by walking outside. She responded well to steroids. Chest x-ray negative, Covid test negative x2, had a Z-Pak. No history of asthma but years ago she was prescribed inhaler by an  allergist and on today's exam there is increased expiratory time with cough. Based on all this information suspect symptoms are allergy and bronchospasm related. Plan: Continue Benadryl, other antihistaminics do not work as well Cough control with Mucinex and hydrocodone. Allergy control with Flonase, Astelin. Symbicort for bronchospasm. Call if not gradually better in 2 weeks. Hyponatremia: Mild, recheck today.   This visit occurred during the SARS-CoV-2 public health emergency.  Safety protocols were in place, including screening questions prior to the visit, additional usage of staff PPE, and extensive cleaning of exam room while observing appropriate contact time as indicated for disinfecting solutions.

## 2020-07-11 NOTE — Progress Notes (Signed)
Pre visit review using our clinic review tool, if applicable. No additional management support is needed unless otherwise documented below in the visit note. 

## 2020-07-11 NOTE — Patient Instructions (Signed)
Continue Benadryl as needed  Mucinex DM as needed for cough  Continue hydrocodone at bedtime  Use Flonase 2 sprays on each side of the nose consistently every day  Start Astelin another nose spray, 2 sprays on each side of the nose twice a day every day  Start Symbicort, 2 inhalations twice a day.  Call if not gradually better.  Definitely call if not better within 2 weeks.  You are taking benazepril, will have to consider stop it at some point.

## 2020-07-12 LAB — BASIC METABOLIC PANEL
BUN: 16 mg/dL (ref 7–25)
CO2: 30 mmol/L (ref 20–32)
Calcium: 9.6 mg/dL (ref 8.6–10.4)
Chloride: 102 mmol/L (ref 98–110)
Creat: 0.76 mg/dL (ref 0.60–0.93)
Glucose, Bld: 90 mg/dL (ref 65–99)
Potassium: 4.7 mmol/L (ref 3.5–5.3)
Sodium: 139 mmol/L (ref 135–146)

## 2020-07-12 NOTE — Assessment & Plan Note (Signed)
Allergies, persistent cough. Symptoms started 06/14/2020, triggered by walking outside. She responded well to steroids. Chest x-ray negative, Covid test negative x2, had a Z-Pak. No history of asthma but years ago she was prescribed inhaler by an  allergist and on today's exam there is increased expiratory time with cough. Based on all this information suspect symptoms are allergy and bronchospasm related. Plan: Continue Benadryl, other antihistaminics do not work as well Cough control with Mucinex and hydrocodone. Allergy control with Flonase, Astelin. Symbicort for bronchospasm. Call if not gradually better in 2 weeks. Hyponatremia: Mild, recheck today.

## 2020-07-14 ENCOUNTER — Ambulatory Visit (INDEPENDENT_AMBULATORY_CARE_PROVIDER_SITE_OTHER): Payer: Medicare PPO

## 2020-07-14 ENCOUNTER — Other Ambulatory Visit: Payer: Self-pay

## 2020-07-14 DIAGNOSIS — Z23 Encounter for immunization: Secondary | ICD-10-CM | POA: Diagnosis not present

## 2020-07-31 ENCOUNTER — Encounter: Payer: Self-pay | Admitting: Obstetrics & Gynecology

## 2020-07-31 ENCOUNTER — Other Ambulatory Visit: Payer: Self-pay

## 2020-07-31 ENCOUNTER — Ambulatory Visit: Payer: Medicare PPO | Admitting: Obstetrics & Gynecology

## 2020-07-31 VITALS — BP 129/62 | HR 89 | Wt 128.0 lb

## 2020-07-31 DIAGNOSIS — N393 Stress incontinence (female) (male): Secondary | ICD-10-CM

## 2020-07-31 DIAGNOSIS — N904 Leukoplakia of vulva: Secondary | ICD-10-CM | POA: Diagnosis not present

## 2020-07-31 IMAGING — DX DG KNEE COMPLETE 4+V*R*
5 series · 5 of 5 positions shown · non-contrast
Comparison: None.

CLINICAL DATA: Right knee pain and tenderness for greater than 3
weeks. No reported injury.

EXAM:
RIGHT KNEE - COMPLETE 4+ VIEW

[knee ap]
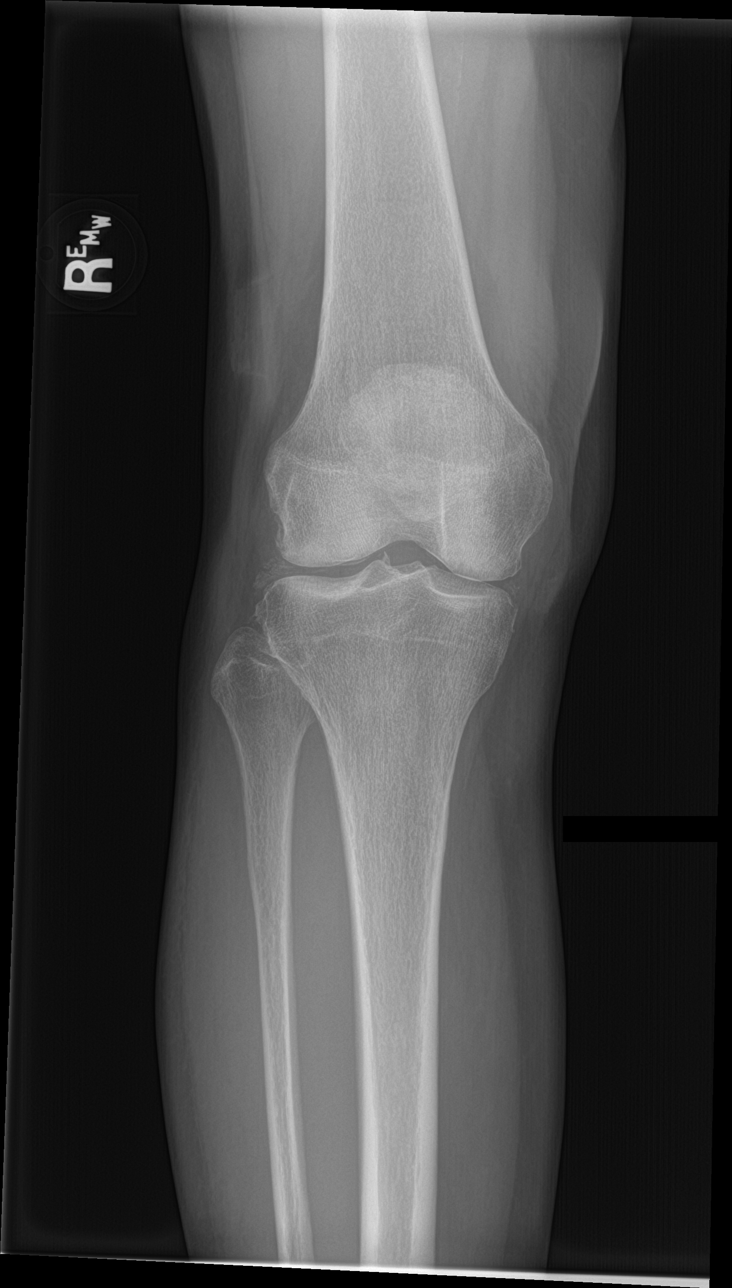

[tunnel (1 of 2)]
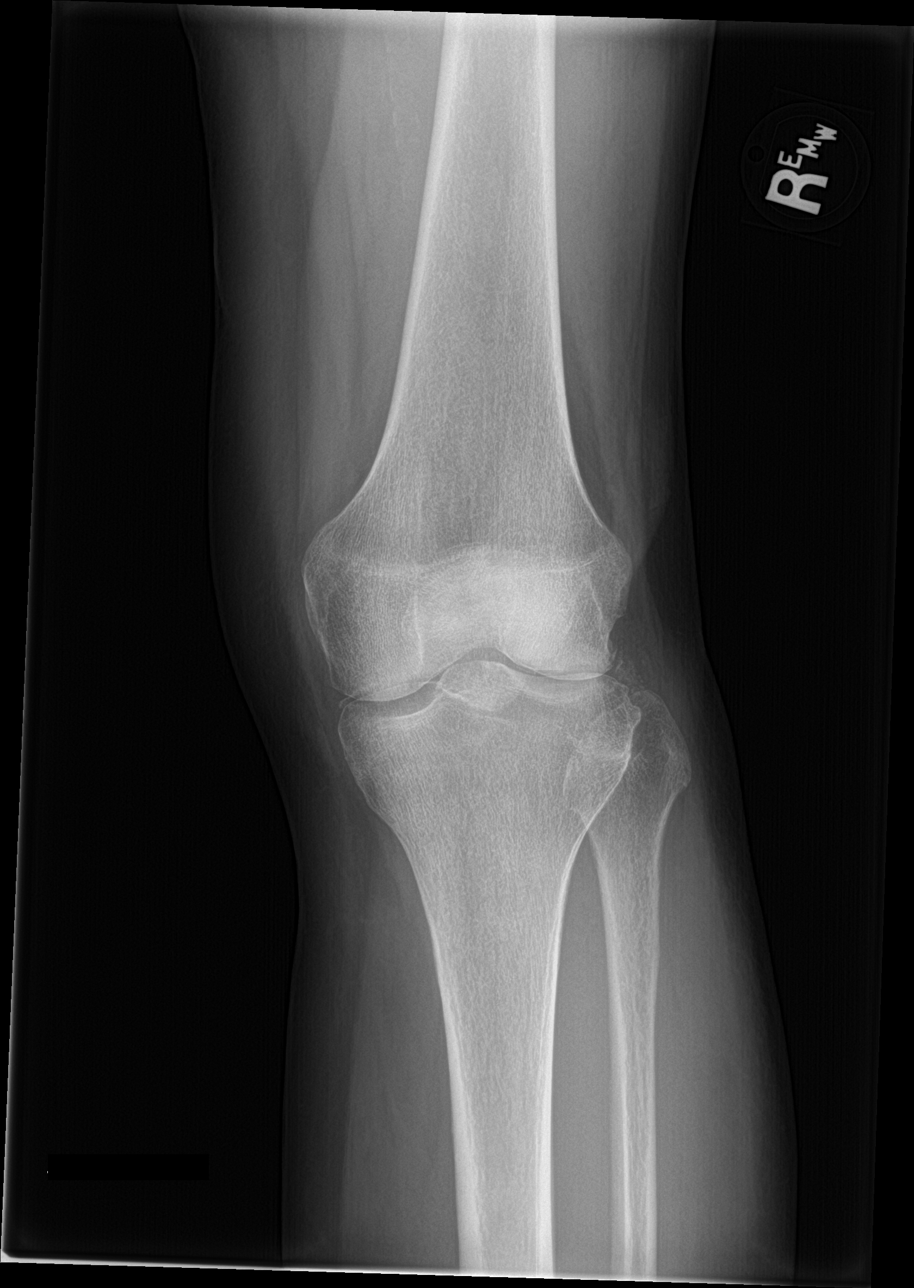

[knee lat]
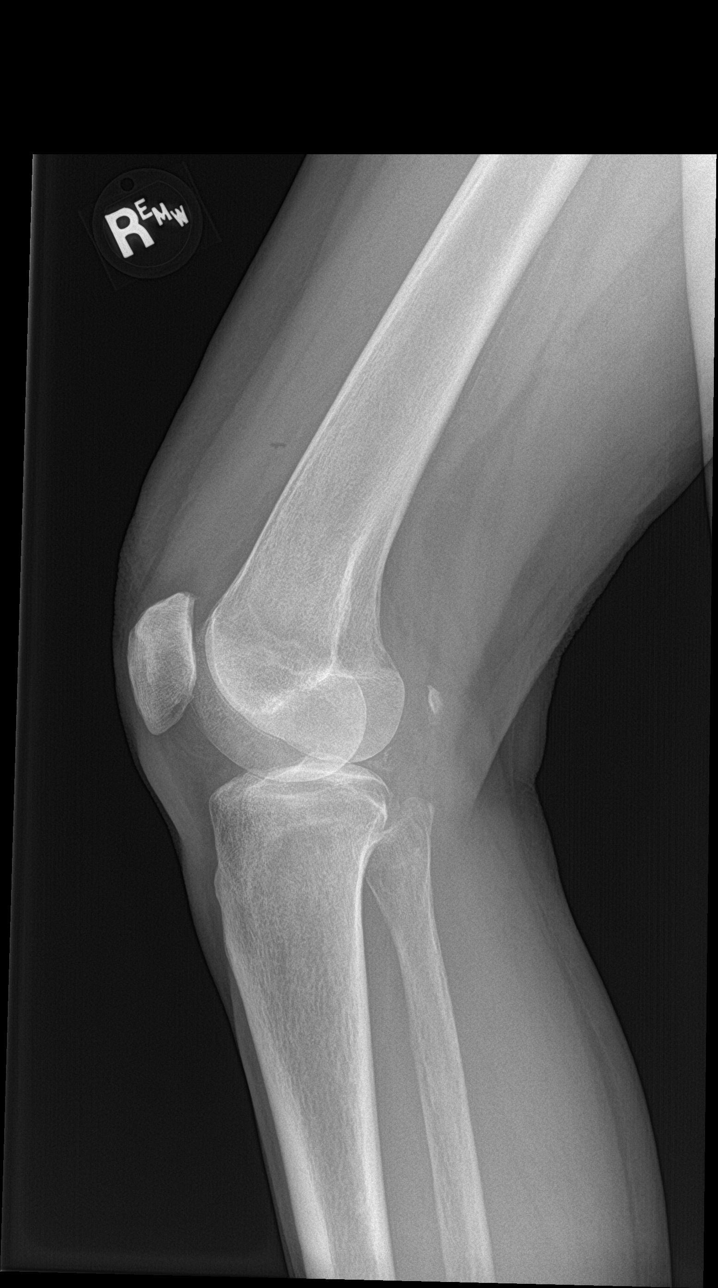

[knee sunrise]
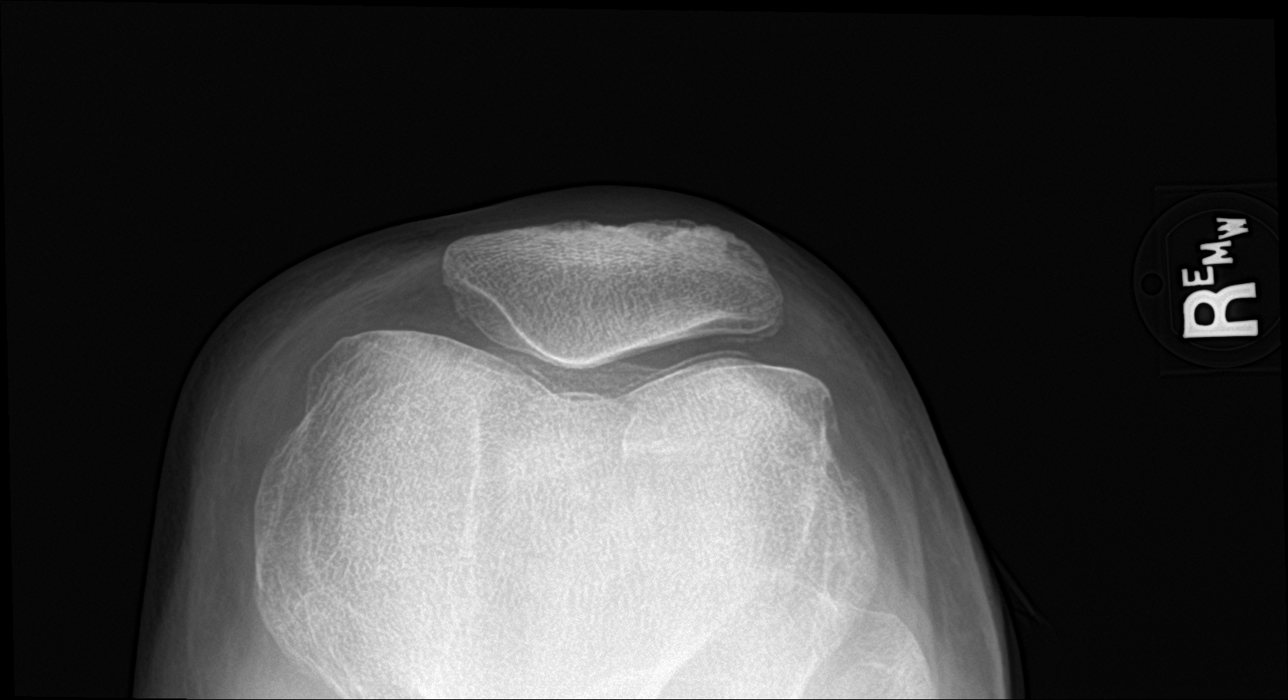

[tunnel (2 of 2)]
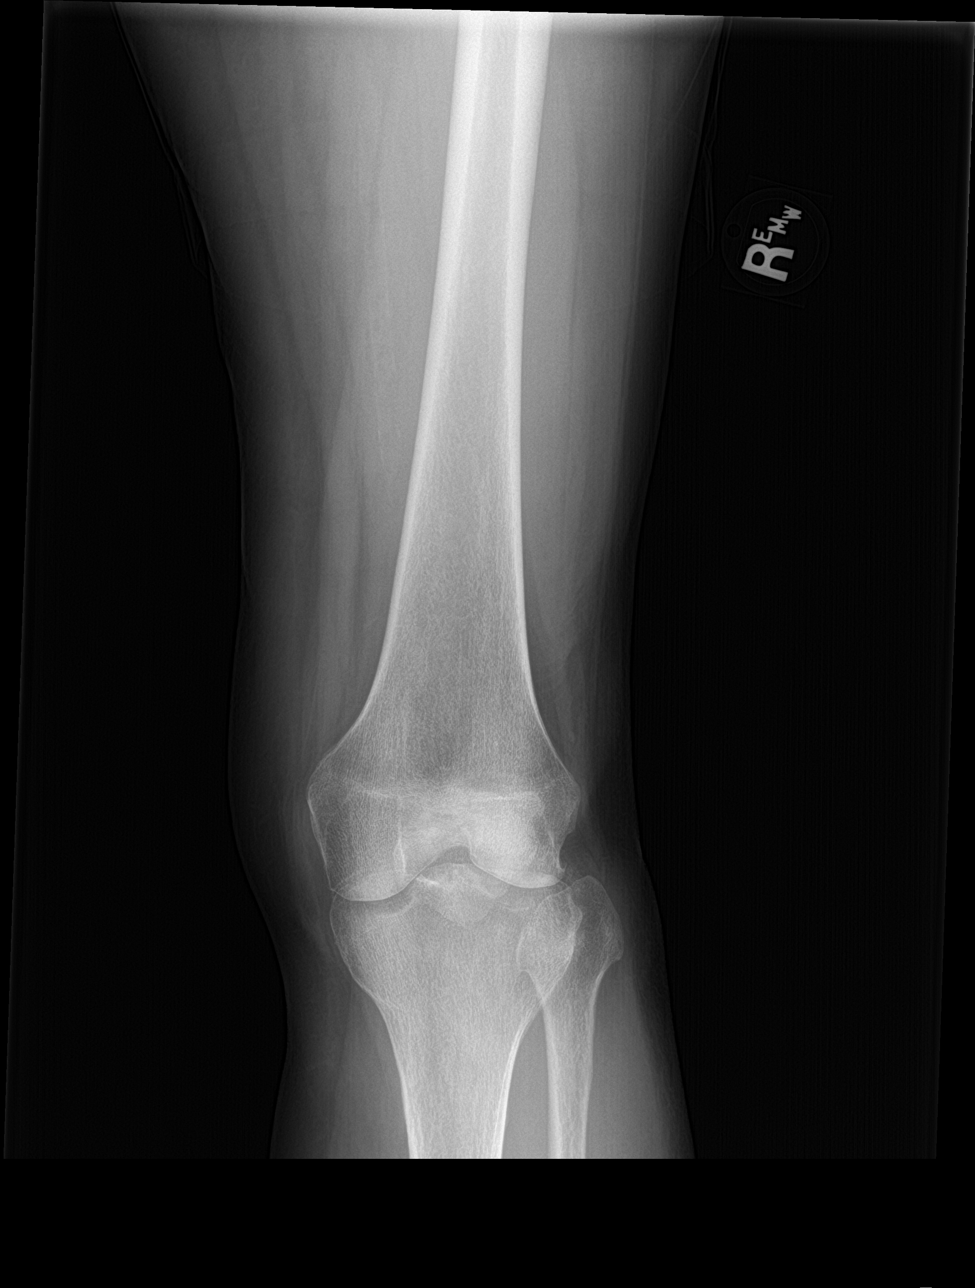

[5 of 5 positions shown; findings below may reference images not displayed]

FINDINGS: There is a small knee joint effusion, and a 4 mm lucency near the
superior aspect of the suprapatellar recess could reflect a tiny
focus of fat or gas. There is mild cortical irregularity of the
medial aspect of the proximal tibia at the level of the plateau,
indeterminate for a tiny avulsion fracture. There is
chondrocalcinosis involving the lateral greater than medial
compartments. Joint space widths are preserved.
IMPRESSION: 1. Small knee joint effusion with possible tiny focus of
nondependent fat or gas which could reflect an underlying
intra-articular fracture or be iatrogenic if there has been recent
injection or aspiration.
2. Possible tiny avulsion fracture of the proximal tibia at the
medial tibial plateau.
3. Chondrocalcinosis.

## 2020-07-31 NOTE — Progress Notes (Signed)
Follow up lichen sclerosis. Armandina Stammer RN

## 2020-07-31 NOTE — Patient Instructions (Signed)

## 2020-07-31 NOTE — Progress Notes (Signed)
History:  74 y.o. [id number] here today for f/u of lichen sclerosus.  Pt reports that at present, she has no sx. She denies itching. She has decreased the application of the steroid cream to 3x/weeks. After pt left she came back to reports that she had bronchitis earlier this month. With coughing she noted leakage of urine. She tried the kegal balls and noted that they worked well.     The following portions of the patient's history were reviewed and updated as appropriate: allergies, current medications, past family history, past medical history, past social history, past surgical history and problem list.  Review of Systems:  Pertinent items are noted in HPI.    Objective:  Physical Exam Blood pressure 129/62, pulse 89, weight 128 lb (58.1 kg).  CONSTITUTIONAL: Well-developed, well-nourished female in no acute distress.  HENT:  Normocephalic, atraumatic EYES: Conjunctivae and EOM are normal. No scleral icterus.  NECK: Normal range of motion SKIN: Skin is warm and dry. No rash noted. Not diaphoretic.No pallor. NEUROLGIC: Alert and oriented to person, place, and time. Normal coordination.  Pelvic: Normal appearing external genitalia; normal appearing vaginal mucosa. No lesions noted.    Assessment & Plan:  Lichen sclerosus  Will hold meds for now and f/u in 3 months    Stress incontinence.   This is new for her and was only assoc with coughing   Pt does not want UA or referral to PT at this time  Kegel exercises.   Readdress and examine eon next visit.   Total face-to-face time with patient was 20 min.  Greater than 50% was spent in counseling and coordination of care with the patient.   Deray Dawes L. Harraway-Smith, M.D., [facility]

## 2020-08-28 ENCOUNTER — Encounter: Payer: Self-pay | Admitting: Internal Medicine

## 2020-08-28 DIAGNOSIS — Z6821 Body mass index (BMI) 21.0-21.9, adult: Secondary | ICD-10-CM | POA: Diagnosis not present

## 2020-08-28 DIAGNOSIS — M25461 Effusion, right knee: Secondary | ICD-10-CM | POA: Diagnosis not present

## 2020-08-28 DIAGNOSIS — M1120 Other chondrocalcinosis, unspecified site: Secondary | ICD-10-CM | POA: Diagnosis not present

## 2020-08-28 DIAGNOSIS — M25561 Pain in right knee: Secondary | ICD-10-CM | POA: Diagnosis not present

## 2020-08-28 MED ORDER — CLONAZEPAM 0.5 MG PO TABS: 0.5000 mg | ORAL_TABLET | Freq: Four times a day (QID) | ORAL | 2 refills | Status: DC | PRN

## 2020-08-28 MED ORDER — DICLOFENAC SODIUM 1 % EX GEL: 2.0000 g | Freq: Four times a day (QID) | CUTANEOUS | 1 refills | Status: DC | PRN

## 2020-08-28 NOTE — Telephone Encounter (Signed)
Requesting: clonazepam 0.5mg  Contract: 12/15/2018 UDS: 04/24/2020 Last Visit: 07/11/2020 Next Visit: 12/05/2020 Last Refill: 07/11/2020 #120 and 0RF Pt sig: 1 tab q4h prn  Please Advise

## 2020-08-29 ENCOUNTER — Other Ambulatory Visit: Payer: Self-pay | Admitting: Internal Medicine

## 2020-08-29 ENCOUNTER — Encounter: Payer: Self-pay | Admitting: Internal Medicine

## 2020-08-29 MED ORDER — COLCHICINE 0.6 MG PO CAPS: 1.0000 | ORAL_CAPSULE | Freq: Two times a day (BID) | ORAL | 0 refills | Status: DC | PRN

## 2020-10-03 ENCOUNTER — Encounter: Payer: Self-pay | Admitting: Internal Medicine

## 2020-10-08 ENCOUNTER — Encounter: Payer: Self-pay | Admitting: Internal Medicine

## 2020-11-06 ENCOUNTER — Ambulatory Visit: Payer: Medicare PPO | Admitting: Obstetrics & Gynecology

## 2020-11-06 ENCOUNTER — Other Ambulatory Visit: Payer: Self-pay

## 2020-11-06 ENCOUNTER — Encounter: Payer: Self-pay | Admitting: Obstetrics & Gynecology

## 2020-11-06 VITALS — BP 140/71 | HR 68 | Ht 64.0 in | Wt 126.0 lb

## 2020-11-06 DIAGNOSIS — L900 Lichen sclerosus et atrophicus: Secondary | ICD-10-CM

## 2020-11-06 DIAGNOSIS — N393 Stress incontinence (female) (male): Secondary | ICD-10-CM

## 2020-11-06 NOTE — Progress Notes (Signed)
History:  75 y.o. Linda Jefferson here today for f/u of lichen sclerosus.  Pt reports that at present, she has no sx. She denies itching or burning (which was her initial sx). She is completely off of the steroid cream for now. On her last visit she reported SUI sx with coughing. This has resolved.      The following portions of the patient's history were reviewed and updated as appropriate: allergies, current medications, past family history, past medical history, past social history, past surgical history and problem list.  Review of Systems:  Pertinent items are noted in HPI.    Objective:  Physical Exam Blood pressure 129/62, pulse 89, weight 128 lb (58.1 kg).  CONSTITUTIONAL: Well-developed, well-nourished female in no acute distress.  HENT:  Normocephalic, atraumatic EYES: Conjunctivae and EOM are normal. No scleral icterus.  NECK: Normal range of motion SKIN: Skin is warm and dry. No rash noted. Not diaphoretic.No pallor. NEUROLGIC: Alert and oriented to person, place, and time. Normal coordination.  Pelvic: Normal appearing external genitalia; normal appearing vaginal mucosa. No lesions noted.    Assessment & Plan:  Lichen sclerosus             Will continue to hold meds for now   f/u in 6 months or sooner prn               Stress incontinence.              improved.   Total face-to-face time with patient was 20 min.  Greater than 50% was spent in counseling and coordination of care with the patient.   Jossiah Smoak L. Harraway-Smith, M.D., [place] [place]

## 2020-11-06 NOTE — Progress Notes (Signed)
Patient states that stress incontinence has gotten a little better. Armandina Stammer RN

## 2020-12-05 ENCOUNTER — Ambulatory Visit (INDEPENDENT_AMBULATORY_CARE_PROVIDER_SITE_OTHER): Payer: Medicare PPO | Admitting: Internal Medicine

## 2020-12-05 ENCOUNTER — Other Ambulatory Visit: Payer: Self-pay

## 2020-12-05 ENCOUNTER — Encounter: Payer: Self-pay | Admitting: Internal Medicine

## 2020-12-05 VITALS — BP 134/76 | HR 66 | Temp 97.9°F | Ht 64.0 in | Wt 129.4 lb

## 2020-12-05 DIAGNOSIS — I10 Essential (primary) hypertension: Secondary | ICD-10-CM

## 2020-12-05 DIAGNOSIS — E7849 Other hyperlipidemia: Secondary | ICD-10-CM | POA: Diagnosis not present

## 2020-12-05 DIAGNOSIS — Z0000 Encounter for general adult medical examination without abnormal findings: Secondary | ICD-10-CM

## 2020-12-05 DIAGNOSIS — Z0001 Encounter for general adult medical examination with abnormal findings: Secondary | ICD-10-CM

## 2020-12-05 DIAGNOSIS — M25562 Pain in left knee: Secondary | ICD-10-CM

## 2020-12-05 DIAGNOSIS — L658 Other specified nonscarring hair loss: Secondary | ICD-10-CM | POA: Diagnosis not present

## 2020-12-05 DIAGNOSIS — F419 Anxiety disorder, unspecified: Secondary | ICD-10-CM | POA: Diagnosis not present

## 2020-12-05 DIAGNOSIS — L661 Lichen planopilaris: Secondary | ICD-10-CM | POA: Diagnosis not present

## 2020-12-05 DIAGNOSIS — Z79899 Other long term (current) drug therapy: Secondary | ICD-10-CM | POA: Diagnosis not present

## 2020-12-05 LAB — LIPID PANEL
Cholesterol: 169 mg/dL (ref 0–200)
HDL: 64.7 mg/dL (ref >39.00)
LDL Cholesterol: 78 mg/dL (ref 0–99)
NonHDL: 103.87
Total CHOL/HDL Ratio: 3
Triglycerides: 131 mg/dL (ref 0.0–149.0)
VLDL: 26.2 mg/dL (ref 0.0–40.0)

## 2020-12-05 LAB — BASIC METABOLIC PANEL
BUN: 19 mg/dL (ref 6–23)
CO2: 30 mEq/L (ref 19–32)
Calcium: 9.8 mg/dL (ref 8.4–10.5)
Chloride: 100 mEq/L (ref 96–112)
Creatinine, Ser: 0.68 mg/dL (ref 0.40–1.20)
GFR: 84.97 mL/min (ref >60.00)
Glucose, Bld: 89 mg/dL (ref 70–99)
Potassium: 4.2 mEq/L (ref 3.5–5.1)
Sodium: 137 mEq/L (ref 135–145)

## 2020-12-05 LAB — TSH: TSH: 3.26 u[IU]/mL (ref 0.35–4.50)

## 2020-12-05 NOTE — Progress Notes (Signed)
Subjective:    Patient ID: Linda Jefferson, female    DOB: October 03, 1946, 75 y.o.   MRN: [id number]  DOS:  12/05/2020 Type of visit - description: CPX  In general feeling well. Continue with left knee problem, Ortho referral?. No recent falls next  Review of Systems Has IBS, no unusual symptoms, specifically no blood in the stools.  Other than above, a 14 point review of systems is negative      Past Medical History:  Diagnosis Date  . Anxiety   . Carotid artery disease (HCC)    per u/s 01/2009- repeated u/s 7/11 "mild dz, no stenosis"  . Colitis   . Dyslipidemia   . Food allergy    mushrooms  . Gallstones   . Hyperlipidemia   . Hypertension   . IBS (irritable bowel syndrome)   . LBP (low back pain) 3/11   after a local injection, f/u [facility]  . Normal cardiac stress test 04/2009  . Osteopenia   . Seasonal allergies   . 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 Wears hearing aid     Past Surgical History:  Procedure Laterality Date  . BUNIONECTOMY Bilateral 03/2013  . CARPAL TUNNEL RELEASE Right   . CATARACT EXTRACTION Bilateral 2020   Dr.Beavis  . CHOLECYSTECTOMY    . TOTAL SHOULDER ARTHROPLASTY Left 04/2016  . TRIGGER FINGER RELEASE  03/31/2012   Procedure: RELEASE TRIGGER FINGER/A-1 PULLEY;  Surgeon: Wyn Forster., MD;  Location: [facility];  Service: Orthopedics;  Laterality: Right;  right index    Allergies as of 12/05/2020      Reactions   Mushroom Extract Complex Shortness Of Breath   Shellfish Allergy Anaphylaxis   Oxycodone Itching, Other (See Comments)   "skin crawling" "skin crawling"   Eggs Or Egg-derived Products Other (See Comments)   Only eats egg whites. Egg yolk causes nausea. Per patient   Pseudoeph-doxylamine-dm-apap    High blood pressure   Tape Rash, Other (See Comments)   Patient states use paper tape only. All other tapes cause rash. Patient states use paper tape only. All other tapes cause  rash. Patient states use paper tape only. All other tapes cause rash. Patient states use paper tape only. All other tapes cause rash.      Medication List       Accurate as of December 05, 2020  8:43 PM. If you have any questions, ask your nurse or doctor.        STOP taking these medications   aspirin EC 81 MG tablet Stopped by: Willow Ora, MD   clobetasol cream 0.05 % Commonly known as: TEMOVATE Stopped by: Willow Ora, MD   HYDROcodone-homatropine 5-1.5 MG/5ML syrup Commonly known as: HYCODAN Stopped by: Willow Ora, MD   hyoscyamine 0.125 MG SL tablet Commonly known as: LEVSIN SL Stopped by: Willow Ora, MD     TAKE these medications   amLODipine-benazepril 5-10 MG capsule Commonly known as: LOTREL Take 1 capsule by mouth daily.   azelastine 0.1 % nasal spray Commonly known as: ASTELIN Place 2 sprays into both nostrils 2 (two) times daily.   Benefiber Powd Take 1 tsp by mouth three times a day with meals   budesonide-formoterol 160-4.5 MCG/ACT inhaler Commonly known as: SYMBICORT Inhale 2 puffs into the lungs 2 (two) times daily.   clonazePAM 0.5 MG tablet Commonly known as: KLONOPIN Take 1 tablet (0.5 mg total) by mouth 4 (four) times daily as needed for  anxiety.   Colchicine 0.6 MG Caps Commonly known as: Mitigare Take 1 tablet by mouth 2 (two) times daily as needed.   diclofenac Sodium 1 % Gel Commonly known as: VOLTAREN Apply 2 g topically 4 (four) times daily as needed.   diphenoxylate-atropine 2.5-0.025 MG tablet Commonly known as: Lomotil Take 1 tablet by mouth every 8 (eight) hours as needed for diarrhea or loose stools.   DRY EYES OP Place 2 drops into both eyes as needed (for dry eyes).   finasteride 5 MG tablet Commonly known as: PROSCAR Take 1 tablet (5 mg total) by mouth daily.   lidocaine-hydrocortisone 3-0.5 % Crea Commonly known as: ANAMANTEL HC Place 1 Applicatorful rectally 2 (two) times daily. As needed   multivitamin capsule Take 1  capsule by mouth daily.   polyethylene glycol powder 17 GM/SCOOP powder Commonly known as: GLYCOLAX/MIRALAX Take 8.5 g by mouth daily.   raloxifene 60 MG tablet Commonly known as: EVISTA Take 1 tablet (60 mg total) by mouth daily.   simvastatin 20 MG tablet Commonly known as: ZOCOR Take 1 tablet (20 mg total) by mouth at bedtime.          Objective:   Physical Exam BP 134/76 (BP Location: Left Arm, Patient Position: Sitting, Cuff Size: Large)   Pulse 66   Temp 97.9 F (36.6 C) (Oral)   Ht 5\' 4"  (1.626 m)   Wt 129 lb 6.4 oz (58.7 kg)   SpO2 98%   BMI 22.21 kg/m  General: Well developed, NAD, BMI noted Neck: No  thyromegaly  HEENT:  Normocephalic . Face symmetric, atraumatic Breast: Skin and nipples normal, no abdominal months, no axillary LAD Lungs:  CTA B Normal respiratory effort, no intercostal retractions, no accessory muscle use. Heart: RRR,  no murmur.  Abdomen:  Not distended, soft, non-tender. No rebound or rigidity.   Lower extremities: no pretibial edema bilaterally  Skin: Exposed areas without rash. Not pale. Not jaundice Neurologic:  alert & oriented X3.  Speech normal, gait appropriate for age and unassisted Strength symmetric and appropriate for age.  Psych: Cognition and judgment appear intact.  Cooperative with normal attention span and concentration.  Behavior appropriate. No anxious or depressed appearing.     Assessment     Assessment HTN Hyperlipidemia Osteopenia  Per dexa 2011,  2014 (Tt score  - 1.7), and 11/2016. On Evista d/t FH breast ca Anxiety, insomnia -- on clonazepam prn Seasonal (and food?) allergies Alopecia Female pattern, lichen planopilaris; saw derm 04-2017   HOH, hearing aids MSK: --Back DJD and spinal stenosis per MRI 06-2017 H/o Syncope, 2010:   --Nl  stress test, Holter: Atrial tachycardia --Carotid ultrasound 2010,2011 , 2014 no significant stenosis +FH CAD, mother age 33 + FH Breast ca- mother, sister x2,  aunt --- on Evista  GI: -IBS, saw GI 08/03/2018 Lichen planus DX gynecology 03/2020 -Admitted colitis 10-2016   PLAN: Here for CPX HTN: On Lotrel, BP is great, checking labs High cholesterol: On simvastatin, last FLP very good Osteopenia: Last DEXA 2019, on Evista (FH breast cancer), no history of vitamin D deficiency. Anxiety, insomnia: On clonazepam, sleeping better, no depression, check UDS Knee pain: Left-sided, refer to Ortho IBS: Could not afford Levsin, on Lomotil as needed ASA: Recommend to discontinue due to current literature Alopecia: On chronic finasteride RTC 6 months  In addition to CPX, chronic medical issues discussed.  See above.  This visit occurred during the SARS-CoV-2 public health emergency.  Safety protocols were in place, including screening  questions prior to the visit, additional usage of staff PPE, and extensive cleaning of exam room while observing appropriate contact time as indicated for disinfecting solutions.

## 2020-12-05 NOTE — Patient Instructions (Addendum)
Recommend to get Tdap and shingles shot at your pharmacy  Please see your gynecologist regularly   GO TO THE LAB : Get the blood work     GO TO THE FRONT DESK, PLEASE SCHEDULE YOUR APPOINTMENTS Come back for   a regular checkup in 6    Critical care medicine: Principles of diagnosis and management in the adult (4th ed., pp. 1610-9604). Saunders."> Miller's anesthesia (8th ed., pp. 232-250). Saunders.">  Advance Directive  Advance directives are legal documents that allow you to make decisions about your health care and medical treatment in case you become unable to communicate for yourself. Advance directives let your wishes be known to family, friends, and health care providers. Discussing and writing advance directives should happen over time rather than all at once. Advance directives can be changed and updated at any time. There are different types of advance directives, such as:  Medical power of attorney.  Living will.  Do not resuscitate (DNR) order or do not attempt resuscitation (DNAR) order. Health care proxy and medical power of attorney A health care proxy is also called a health care agent. This person is appointed to make medical decisions for you when you are unable to make decisions for yourself. Generally, people ask a trusted friend or family member to act as their proxy and represent their preferences. Make sure you have an agreement with your trusted person to act as your proxy. A proxy may have to make a medical decision on your behalf if your wishes are not known. A medical power of attorney, also called a durable power of attorney for health care, is a legal document that names your health care proxy. Depending on the laws in your state, the document may need to be:  Signed.  Notarized.  Dated.  Copied.  Witnessed.  Incorporated into your medical record. You may also want to appoint a trusted person to manage your money in the event you are unable to do  so. This is called a durable power of attorney for finances. It is a separate legal document from the durable power of attorney for health care. You may choose your health care proxy or someone different to act as your agent in money matters. If you do not appoint a proxy, or there is a concern that the proxy is not acting in your best interest, a court may appoint a guardian to act on your behalf. Living will A living will is a set of instructions that state your wishes about medical care when you cannot express them yourself. Health care providers should keep a copy of your living will in your medical record. You may want to give a copy to family members or friends. To alert caregivers in case of an emergency, you can place a card in your wallet to let them know that you have a living will and where they can find it. A living will is used if you become:  Terminally ill.  Disabled.  Unable to communicate or make decisions. The following decisions should be included in your living will:  To use or not to use life support equipment, such as dialysis machines and breathing machines (ventilators).  Whether you want a DNR or DNAR order. This tells health care providers not to use cardiopulmonary resuscitation (CPR) if breathing or heartbeat stops.  To use or not to use tube feeding.  To be given or not to be given food and fluids.  Whether you want comfort (palliative) care  when the goal becomes comfort rather than a cure.  Whether you want to donate your organs and tissues. A living will does not give instructions for distributing your money and property if you should pass away. DNR or DNAR A DNR or DNAR order is a request not to have CPR in the event that your heart stops beating or you stop breathing. If a DNR or DNAR order has not been made and shared, a health care provider will try to help any patient whose heart has stopped or who has stopped breathing. If you plan to have surgery, talk  with your health care provider about how your DNR or DNAR order will be followed if problems occur. What if I do not have an advance directive? Some states assign family decision makers to act on your behalf if you do not have an advance directive. Each state has its own laws about advance directives. You may want to check with your health care provider, attorney, or state representative about the laws in your state. Summary  Advance directives are legal documents that allow you to make decisions about your health care and medical treatment in case you become unable to communicate for yourself.  The process of discussing and writing advance directives should happen over time. You can change and update advance directives at any time.  Advance directives may include a medical power of attorney, a living will, and a DNR or DNAR order. This information is not intended to replace advice given to you by your health care provider. Make sure you discuss any questions you have with your health care provider. Document Revised: 06/27/2020 Document Reviewed: 06/27/2020 Elsevier Patient Education  2021 [employer].

## 2020-12-05 NOTE — Assessment & Plan Note (Signed)
-  Td 5-10; recommend a Tdap at the pharmacy -PNM 13: 05-2019; PNM 23: 11-2019 (had a local reaction) -COVID VAX x3  - Shingrex recommended but  declined it before --Had a flu shot -Female care: Sees gyn, dx Lichen Sclerosis; no further PAPs unless pt so desires  - Strong FH breast ca, pt reports previous genetic testing: negative; MMG 02/2020, breast exam today normal -Colonoscopy: 04/18/2008, hemorrhoids. Cscope 12-2016 (done for colitis): no plyps -Palpable Ao: [place] neg for AAA 6-10 -diet-exercise- advance directives : discussed  -Labs:  BMP, FLP, TSH,

## 2020-12-05 NOTE — Assessment & Plan Note (Signed)
Here for CPX HTN: On Lotrel, BP is great, checking labs High cholesterol: On simvastatin, last FLP very good Osteopenia: Last DEXA 2019, on Evista (FH breast cancer), no history of vitamin D deficiency. Anxiety, insomnia: On clonazepam, sleeping better, no depression, check UDS Knee pain: Left-sided, refer to Ortho IBS: Could not afford Levsin, on Lomotil as needed ASA: Recommend to discontinue due to current literature Alopecia: On chronic finasteride RTC 6 months

## 2020-12-07 ENCOUNTER — Telehealth: Payer: Self-pay

## 2020-12-07 LAB — DRUG MONITORING, PANEL 8 WITH CONFIRMATION, URINE
6 Acetylmorphine: NEGATIVE ng/mL (ref <10)
Alcohol Metabolites: POSITIVE ng/mL — AB
Alphahydroxyalprazolam: NEGATIVE ng/mL (ref <25)
Alphahydroxymidazolam: NEGATIVE ng/mL (ref <50)
Alphahydroxytriazolam: NEGATIVE ng/mL (ref <50)
Aminoclonazepam: 181 ng/mL — ABNORMAL HIGH (ref <25)
Amphetamines: NEGATIVE ng/mL (ref <500)
Benzodiazepines: POSITIVE ng/mL — AB (ref <100)
Buprenorphine, Urine: NEGATIVE ng/mL (ref <5)
Cocaine Metabolite: NEGATIVE ng/mL (ref <150)
Creatinine: 19.5 mg/dL
Ethyl Glucuronide (ETG): 612 ng/mL — ABNORMAL HIGH (ref <500)
Ethyl Sulfate (ETS): 149 ng/mL — ABNORMAL HIGH (ref <100)
Hydroxyethylflurazepam: NEGATIVE ng/mL (ref <50)
Lorazepam: NEGATIVE ng/mL (ref <50)
MDMA: NEGATIVE ng/mL (ref <500)
Marijuana Metabolite: NEGATIVE ng/mL (ref <20)
Nordiazepam: NEGATIVE ng/mL (ref <50)
Opiates: NEGATIVE ng/mL (ref <100)
Oxazepam: NEGATIVE ng/mL (ref <50)
Oxidant: NEGATIVE ug/mL
Oxycodone: NEGATIVE ng/mL (ref <100)
Specific Gravity: 1.006 (ref >=1.0)
Temazepam: NEGATIVE ng/mL (ref <50)
pH: 6.6 (ref 4.5–9.0)

## 2020-12-07 LAB — DM TEMPLATE

## 2020-12-07 NOTE — Telephone Encounter (Signed)
Advanced directives received. Sent for scanning.

## 2020-12-07 NOTE — Telephone Encounter (Signed)
Advanced Directives received. Sent for scanning.

## 2020-12-08 ENCOUNTER — Telehealth: Payer: Self-pay | Admitting: Gastroenterology

## 2020-12-08 NOTE — Telephone Encounter (Signed)
Patient called and would like to speak with a nurse\cma regarding the Lomotil medication

## 2020-12-11 NOTE — Telephone Encounter (Signed)
Dr Lavon Paganini patient wants to go back to taking Lomotil and wants a rx called in. You had d/c'd it at her last appointment. She says that the hyoscyamine that you had previously prescribed for her is no longer covered by her insurance

## 2020-12-11 NOTE — Telephone Encounter (Signed)
This is a [facility] patient

## 2020-12-11 NOTE — Telephone Encounter (Signed)
Ok to send Rx for Lomotil 1 tab daily as needed X 30 with 3 refills. Please schedule follow up visit, next available. Thanks

## 2020-12-13 MED ORDER — DIPHENOXYLATE-ATROPINE 2.5-0.025 MG PO TABS: 1.0000 | ORAL_TABLET | Freq: Every day | ORAL | 3 refills | Status: DC

## 2020-12-17 ENCOUNTER — Encounter: Payer: Self-pay | Admitting: Internal Medicine

## 2020-12-17 ENCOUNTER — Telehealth: Payer: Self-pay | Admitting: Internal Medicine

## 2020-12-18 NOTE — Telephone Encounter (Signed)
Requesting: clonazepam 0.5mg  Contract: 12/05/2020 UDS: 12/05/2020 Last Visit: 12/05/2020 Next Visit: 06/08/2021 Last Refill: 08/28/2020 #120 and 2RF Pt sig: 1 tab qid prn  Please Advise

## 2020-12-18 NOTE — Telephone Encounter (Signed)
Got 3 diazepam 09/14/2020, otherwise PDMP okay, prescription sent

## 2020-12-18 NOTE — Telephone Encounter (Deleted)
Requesting: clonazepam 0.5mg Contract: 12/05/2020 UDS: 12/05/2020 Last Visit: 12/05/2020 Next Visit: 06/08/2021 Last Refill: 08/28/2020 #120 and 2RF Pt sig: 1 tab qid prn  Please Advise 

## 2020-12-23 ENCOUNTER — Other Ambulatory Visit: Payer: Self-pay | Admitting: Internal Medicine

## 2020-12-28 ENCOUNTER — Encounter: Payer: Self-pay | Admitting: Internal Medicine

## 2021-01-02 DIAGNOSIS — F411 Generalized anxiety disorder: Secondary | ICD-10-CM | POA: Diagnosis not present

## 2021-01-14 ENCOUNTER — Encounter: Payer: Self-pay | Admitting: Internal Medicine

## 2021-01-22 ENCOUNTER — Ambulatory Visit: Payer: Medicare PPO | Admitting: Obstetrics & Gynecology

## 2021-01-22 ENCOUNTER — Other Ambulatory Visit: Payer: Self-pay

## 2021-01-22 ENCOUNTER — Encounter: Payer: Self-pay | Admitting: Obstetrics & Gynecology

## 2021-01-22 VITALS — BP 160/73 | HR 92 | Ht 64.0 in | Wt 126.0 lb

## 2021-01-22 DIAGNOSIS — N362 Urethral caruncle: Secondary | ICD-10-CM

## 2021-01-22 DIAGNOSIS — F411 Generalized anxiety disorder: Secondary | ICD-10-CM | POA: Diagnosis not present

## 2021-01-22 DIAGNOSIS — L900 Lichen sclerosus et atrophicus: Secondary | ICD-10-CM | POA: Diagnosis not present

## 2021-01-22 NOTE — Progress Notes (Signed)
Linda Jefferson History:  75 y.o. [id number] here today for red bump on urethra. She denies any sx. She felt to see if it was present as she'd has sx prev. She just wanted to make sure that there is nothing to worry about. She denies itching from the Linda scleroses. She is not using the cream at present.     The following portions of the patient's history were reviewed and updated as appropriate: allergies, current medications, past family history, past medical history, past social history, past surgical history and problem list.  Review of Systems:  Pertinent items are noted in HPI.    Objective:  Physical Exam Blood pressure (!) 160/73, pulse 92, height 5\' 4"  (1.626 m), weight 126 lb (57.2 kg).  CONSTITUTIONAL: Well-developed, well-nourished female in no acute distress.  HENT:  Normocephalic, atraumatic EYES: Conjunctivae and EOM are normal. No scleral icterus.  NECK: Normal range of motion SKIN: Skin is warm and dry. No rash noted. Not diaphoretic.No pallor. NEUROLGIC: Alert and oriented to person, place, and time. Normal coordination.  Pelvic: Normal appearing external genitalia; there is a urethral caruncle that is a bit more pronounced today. This is benign in appearance. The vulva is without lesions.    Assessment & Plan:  Linda sclerosus Will continue to hold meds for now             f/u in 6 months or sooner prn   Urethral caruncle  Pt declined topical EES due to her past history  This can be tried safety in the future if this becomes a problem for her  L. Harraway-Smith, M.D., [name]

## 2021-01-24 ENCOUNTER — Other Ambulatory Visit: Payer: Self-pay | Admitting: Obstetrics & Gynecology

## 2021-01-24 ENCOUNTER — Telehealth: Payer: Self-pay

## 2021-01-24 ENCOUNTER — Other Ambulatory Visit: Payer: Self-pay

## 2021-01-24 DIAGNOSIS — N362 Urethral caruncle: Secondary | ICD-10-CM

## 2021-01-24 MED ORDER — ESTRACE 0.1 MG/GM VA CREA: 0.5000 g | TOPICAL_CREAM | Freq: Every day | VAGINAL | 2 refills | Status: DC

## 2021-01-24 NOTE — Telephone Encounter (Signed)
Patient called and made aware of new prescription sent per Dr. Erin Fulling. Patient made aware to use the estrogen cream three time a week for a total of six weeks. Patient states understanding.Armandina Stammer RN

## 2021-01-29 DIAGNOSIS — F411 Generalized anxiety disorder: Secondary | ICD-10-CM | POA: Diagnosis not present

## 2021-02-12 ENCOUNTER — Telehealth: Payer: Self-pay | Admitting: General Practice

## 2021-02-12 DIAGNOSIS — F411 Generalized anxiety disorder: Secondary | ICD-10-CM | POA: Diagnosis not present

## 2021-02-12 NOTE — Telephone Encounter (Signed)
Left message on VM for patient to contact our office to schedule 6 month follow up visit with Dr. Erin Fulling.  Patient is due to see provider in July 2022.

## 2021-02-13 ENCOUNTER — Encounter: Payer: Self-pay | Admitting: Internal Medicine

## 2021-02-13 DIAGNOSIS — M549 Dorsalgia, unspecified: Secondary | ICD-10-CM

## 2021-02-19 DIAGNOSIS — F411 Generalized anxiety disorder: Secondary | ICD-10-CM | POA: Diagnosis not present

## 2021-02-19 DIAGNOSIS — Z1231 Encounter for screening mammogram for malignant neoplasm of breast: Secondary | ICD-10-CM | POA: Diagnosis not present

## 2021-02-19 LAB — HM MAMMOGRAPHY

## 2021-02-26 DIAGNOSIS — F411 Generalized anxiety disorder: Secondary | ICD-10-CM | POA: Diagnosis not present

## 2021-02-28 ENCOUNTER — Ambulatory Visit: Payer: Medicare PPO | Admitting: Gastroenterology

## 2021-02-28 ENCOUNTER — Encounter: Payer: Self-pay | Admitting: Gastroenterology

## 2021-02-28 VITALS — BP 108/62 | HR 70 | Ht 64.0 in | Wt 127.0 lb

## 2021-02-28 DIAGNOSIS — K582 Mixed irritable bowel syndrome: Secondary | ICD-10-CM | POA: Diagnosis not present

## 2021-02-28 NOTE — Progress Notes (Signed)
Linda Jefferson    [id number]    1946/06/27  Primary Care Physician:Paz, Nolon Rod, MD  Referring Physician: Wanda Plump, MD [address] [place],  [place] [address] follow-up   Chief complaint:  IBS  HPI:  75 year old very pleasant female here for follow-up visit for chronic irritable bowel syndrome with alternating constipation and diarrhea She is currently off most of her medications.  Takes Lomotil as needed for intermittent diarrhea  She has stopped taking Benefiber and MiraLAX in the past few months  She is trying to eat more fruits and vegetables, and also has increased water intake.  Denies any nausea, vomiting, abdominal pain, melena or bright red blood per rectum  Colonoscopy March 2018 showed small internal hemorrhoids otherwise normal exam   Outpatient Encounter Medications as of 02/28/2021  Medication Sig  . amLODipine-benazepril (LOTREL) 5-10 MG capsule Take 1 capsule by mouth daily.  . Artificial Tear Ointment (DRY EYES OP) Place 2 drops into both eyes as needed (for dry eyes).  Marland Kitchen azelastine (ASTELIN) 0.1 % nasal spray Place 2 sprays into both nostrils 2 (two) times daily.  . clonazePAM (KLONOPIN) 0.5 MG tablet TAKE 1 TABLET (0.5 MG TOTAL) BY MOUTH 4 (FOUR) TIMES DAILY AS NEEDED FOR ANXIETY.  . diclofenac Sodium (VOLTAREN) 1 % GEL Apply 2 g topically 4 (four) times daily as needed.  . diphenoxylate-atropine (LOMOTIL) 2.5-0.025 MG tablet Take 1 tablet by mouth daily.  Marland Kitchen ESTRACE VAGINAL 0.1 MG/GM vaginal cream Place 0.5 g vaginally daily. Apply to affected area.  . finasteride (PROSCAR) 5 MG tablet Take 1 tablet (5 mg total) by mouth daily.  Marland Kitchen lidocaine-hydrocortisone (ANAMANTEL HC) 3-0.5 % CREA Place 1 Applicatorful rectally 2 (two) times daily. As needed  . Multiple Vitamin (MULTIVITAMIN) capsule Take 1 capsule by mouth daily.  . polyethylene glycol powder (GLYCOLAX/MIRALAX) powder Take 8.5 g by mouth daily.   . raloxifene (EVISTA) 60  MG tablet Take 1 tablet (60 mg total) by mouth daily.  . simvastatin (ZOCOR) 20 MG tablet Take 1 tablet (20 mg total) by mouth at bedtime.  . Wheat Dextrin (BENEFIBER) POWD Take 1 tsp by mouth three times a day with meals   No facility-administered encounter medications on file as of 02/28/2021.    Allergies as of 02/28/2021 - Review Complete 01/22/2021  Allergen Reaction Noted  . Mushroom extract complex Shortness Of Breath 08/09/2015  . Shellfish allergy Anaphylaxis 08/03/2014  . Oxycodone Itching and Other (See Comments) 05/17/2016  . Eggs or egg-derived products Other (See Comments) 09/02/2012  . Pseudoeph-doxylamine-dm-apap  04/05/2016  . Tape Rash and Other (See Comments) 04/05/2016    Past Medical History:  Diagnosis Date  . Anxiety   . Carotid artery disease (HCC)    per u/s 01/2009- repeated u/s 7/11 "mild dz, no stenosis"  . Colitis   . Dyslipidemia   . Food allergy    mushrooms  . Gallstones   . Hyperlipidemia   . Hypertension   . IBS (irritable bowel syndrome)   . LBP (low back pain) 3/11   after a local injection, f/u [facility]  . Normal cardiac stress test 04/2009  . Osteopenia   . Seasonal allergies   . 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 Wears hearing aid     Past Surgical History:  Procedure Laterality Date  . BUNIONECTOMY Bilateral 03/2013  . CARPAL TUNNEL RELEASE Right   . CATARACT  EXTRACTION Bilateral 2020   Dr.Beavis  . CHOLECYSTECTOMY    . TOTAL SHOULDER ARTHROPLASTY Left 04/2016  . TRIGGER FINGER RELEASE  03/31/2012   Procedure: RELEASE TRIGGER FINGER/A-1 PULLEY;  Surgeon: Wyn Forster., MD;  Location: [facility];  Service: Orthopedics;  Laterality: Right;  right index    Family History  Problem Relation Age of Onset  . Hypertension Father   . Other Father        cerebral hemorrhage  . Coronary artery disease Mother 46  . Stroke Mother   . Diabetes Mother   . Breast cancer Mother   . Hypertension  Mother   . Hypertension Sister   . Breast cancer Maternal Aunt        x 2  . Breast cancer Paternal Aunt   . Colon cancer Neg Hx     Social History   Socioeconomic History  . Marital status: Married    Spouse name: Not on file  . Number of children: 3  . Years of education: Not on file  . Highest education level: Not on file  Occupational History  . 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 Smoking status: Never Smoker  . Smokeless tobacco: Never Used  Vaping Use  . Vaping Use: Never used  Substance and Sexual Activity  . Alcohol use: Yes    Alcohol/week: 2.0 standard drinks    Types: 2 Glasses of wine per week    Comment: socially wine 4-5x/week  . Drug use: No  . Sexual activity: Not Currently  Other Topics Concern  . Not on file  Social History Narrative   Household: pt and husband   3 children, one in [place] (PhD)               Social Determinants of Health   Financial Resource Strain: Not on file  Food Insecurity: Not on file  Transportation Needs: Not on file  Physical Activity: Not on file  Stress: Not on file  Social Connections: Not on file  Intimate Partner Violence: Not on file      Review of systems: All other review of systems negative except as mentioned in the HPI.   Physical Exam: Vitals:   02/28/21 1044  BP: 108/62  Pulse: 70   Body mass index is 21.8 kg/m. Gen:      No acute distress Neuro: alert and oriented x 3 Psych: normal mood and affect  Data Reviewed:  Reviewed labs, radiology imaging, old records and pertinent past GI work up   Assessment and Plan/Recommendations:  75 year old very pleasant female with history of irritable bowel syndrome with alternating constipation and diarrhea  Continue high-fiber diet with increased water intake 8 to 10 cups daily Start Benefiber 1 teaspoon twice daily with meals, titrate up based on response Use MiraLAX as needed if continues  to have persistent constipation, titrate to have 1 soft bowel movement daily  Use Lomotil 1 tablet daily as needed if has severe diarrhea Return in 6 months or sooner if needed   The patient was provided an opportunity to ask questions and all were answered. The patient agreed with the plan and demonstrated an understanding of the instructions.  Iona Beard , MD    CC: Wanda Plump, MD

## 2021-02-28 NOTE — Patient Instructions (Signed)
Take Benefiber 1 teaspoon twice a day  Take Miralax titrate as needed to have one soft bowel movement daily  Use the Lomotil as needed  Follow up in 6 months  Due to recent changes in healthcare laws, you may see the results of your imaging and laboratory studies on MyChart before your provider has had a chance to review them.  We understand that in some cases there may be results that are confusing or concerning to you. Not all laboratory results come back in the same time frame and the provider may be waiting for multiple results in order to interpret others.  Please give [place] 48 hours in order for your provider to thoroughly review all the results before contacting the office for clarification of your results.   If you are age 41 or older, your body mass index should be between 23-30. Your Body mass index is 21.8 kg/m. If this is out of the aforementioned range listed, please consider follow up with your Primary Care Provider.  If you are age 70 or younger, your body mass index should be between 19-25. Your Body mass index is 21.8 kg/m. If this is out of the aformentioned range listed, please consider follow up with your Primary Care Provider.    I appreciate the  opportunity to care for you  Thank You   Marsa Aris , MD

## 2021-03-05 ENCOUNTER — Other Ambulatory Visit: Payer: Self-pay | Admitting: Internal Medicine

## 2021-03-05 ENCOUNTER — Encounter: Payer: Self-pay | Admitting: Internal Medicine

## 2021-03-06 MED ORDER — RALOXIFENE HCL 60 MG PO TABS
60.0000 mg | ORAL_TABLET | Freq: Every day | ORAL | 3 refills | Status: DC
Start: 2021-03-06 — End: 2022-04-08

## 2021-03-13 DIAGNOSIS — F411 Generalized anxiety disorder: Secondary | ICD-10-CM | POA: Diagnosis not present

## 2021-03-14 NOTE — Telephone Encounter (Signed)
Requesting: clonazepam 0.5mg  Contract: 12/05/2020 UDS: 12/05/2020 Last Visit: 12/05/2020 Next Visit: 06/08/2021 Last Refill: 12/18/2020 #120 and 2RF Pt sig: 1 tab q4h prn  Please Advise

## 2021-03-15 MED ORDER — CLONAZEPAM 0.5 MG PO TABS: 0.5000 mg | ORAL_TABLET | Freq: Four times a day (QID) | ORAL | 2 refills | Status: DC | PRN

## 2021-03-16 ENCOUNTER — Other Ambulatory Visit: Payer: Self-pay | Admitting: Obstetrics & Gynecology

## 2021-03-16 DIAGNOSIS — N362 Urethral caruncle: Secondary | ICD-10-CM

## 2021-03-16 MED ORDER — ESTRACE 0.1 MG/GM VA CREA: 0.5000 g | TOPICAL_CREAM | Freq: Every day | VAGINAL | 2 refills | Status: DC

## 2021-03-26 DIAGNOSIS — F411 Generalized anxiety disorder: Secondary | ICD-10-CM | POA: Diagnosis not present

## 2021-04-02 DIAGNOSIS — F411 Generalized anxiety disorder: Secondary | ICD-10-CM | POA: Diagnosis not present

## 2021-04-13 NOTE — Progress Notes (Signed)
Subjective:   Linda Jefferson is a 75 y.o. female who presents for Medicare Annual (Subsequent) preventive examination.  I connected with Linda Jefferson today by telephone and verified that I am speaking with the correct person using two identifiers. Location patient: home Location provider: work Persons participating in the virtual visit: patient, Engineer, civil (consulting).    I discussed the limitations, risks, security and privacy concerns of performing an evaluation and management service by telephone and the availability of in person appointments. I also discussed with the patient that there may be a patient responsible charge related to this service. The patient expressed understanding and verbally consented to this telephonic visit.    Interactive audio and video telecommunications were attempted between this provider and patient, however failed, due to patient having technical difficulties OR patient did not have access to video capability.  We continued and completed visit with audio only.  Some vital signs may be absent or patient reported.   Time Spent with patient on telephone encounter: 40 minutes   Review of Systems     Cardiac Risk Factors include: advanced age (>52men, >10 women);hypertension;dyslipidemia     Objective:    Today's Vitals   04/16/21 0944  Weight: 124 lb 6.4 oz (56.4 kg)  Height: 5\' 4"  (1.626 m)   Body mass index is 21.35 kg/m.  Advanced Directives 04/16/2021 12/17/2019 12/15/2018 12/12/2017 11/25/2016 11/03/2016 10/29/2016  Does Patient Have a Medical Advance Directive? Yes Yes Yes Yes Yes - Yes  Type of Advance Directive Healthcare Power of [place];Living will Healthcare Power of [place];Living will Healthcare Power of [place];Living will Healthcare Power of [place];Living will Living will;Healthcare Power of Attorney Living will;Healthcare Power of [place] Power of [place];Living will  Does patient want to make changes to medical advance directive? - No -  Patient declined No - Patient declined - No - Patient declined - -  Copy of Healthcare Power of Attorney in Chart? Yes - validated most recent copy scanned in chart (See row information) No - copy requested No - copy requested No - copy requested No - copy requested No - copy requested -    Current Medications (verified) Outpatient Encounter Medications as of 04/16/2021  Medication Sig   amLODipine-benazepril (LOTREL) 5-10 MG capsule Take 1 capsule by mouth daily.   Artificial Tear Ointment (DRY EYES OP) Place 2 drops into both eyes as needed (for dry eyes).   azelastine (ASTELIN) 0.1 % nasal spray Place 2 sprays into both nostrils 2 (two) times daily.   clonazePAM (KLONOPIN) 0.5 MG tablet Take 1 tablet (0.5 mg total) by mouth 4 (four) times daily as needed for anxiety.   diclofenac Sodium (VOLTAREN) 1 % GEL Apply 2 g topically 4 (four) times daily as needed.   diphenoxylate-atropine (LOMOTIL) 2.5-0.025 MG tablet Take 1 tablet by mouth daily. (Patient taking differently: Take 1 tablet by mouth as needed.)   ESTRACE VAGINAL 0.1 MG/GM vaginal cream Place 0.5 g vaginally daily. Apply to affected area.   finasteride (PROSCAR) 5 MG tablet Take 1 tablet (5 mg total) by mouth daily.   lidocaine-hydrocortisone (ANAMANTEL HC) 3-0.5 % CREA Place 1 Applicatorful rectally 2 (two) times daily. As needed   Multiple Vitamin (MULTIVITAMIN) capsule Take 1 capsule by mouth daily.   raloxifene (EVISTA) 60 MG tablet Take 1 tablet (60 mg total) by mouth daily.   simvastatin (ZOCOR) 20 MG tablet Take 1 tablet (20 mg total) by mouth at bedtime.   No facility-administered encounter medications on file as of 04/16/2021.  Allergies (verified) Mushroom extract complex, Shellfish allergy, Oxycodone, Eggs or egg-derived products, Pseudoeph-doxylamine-dm-apap, and Tape   History: Past Medical History:  Diagnosis Date   Anxiety    Carotid artery disease (HCC)    per u/s 01/2009- repeated u/s 7/11 "mild dz, no  stenosis"   Colitis    Dyslipidemia    Food allergy    mushrooms   Gallstones    Hyperlipidemia    Hypertension    IBS (irritable bowel syndrome)    LBP (low back pain) 3/11   after a local injection, f/u [facility]   Normal cardiac stress test 04/2009   Osteopenia    Seasonal allergies    Syncope 01/2009   holter atrail tachycardia   Wears hearing aid    Past Surgical History:  Procedure Laterality Date   BUNIONECTOMY Bilateral 03/2013   CARPAL TUNNEL RELEASE Right    CATARACT EXTRACTION Bilateral 2020   Dr.Beavis   CHOLECYSTECTOMY     TOTAL SHOULDER ARTHROPLASTY Left 04/2016   TRIGGER FINGER RELEASE  03/31/2012   Procedure: RELEASE TRIGGER FINGER/A-1 PULLEY;  Surgeon: Wyn Forster., MD;  Location: [facility];  Service: Orthopedics;  Laterality: Right;  right index   Family History  Problem Relation Age of Onset   Hypertension Father    Other Father        cerebral hemorrhage   Coronary artery disease Mother 73   Stroke Mother    Diabetes Mother    Breast cancer Mother    Hypertension Mother    Hypertension Sister    Breast cancer Maternal Aunt        x 2   Breast cancer Paternal Aunt    Colon cancer Neg Hx    Social History   Socioeconomic History   Marital status: Married    Spouse name: Not on file   Number of children: 3   Years of education: Not on file   Highest education level: Not on file  Occupational History   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[employer]  Tobacco Use   Smoking status: Never   Smokeless tobacco: Never  Vaping Use   Vaping Use: Never used  Substance and Sexual Activity   Alcohol use: Yes    Alcohol/week: 2.0 standard drinks    Types: 2 Glasses of wine per week    Comment: socially wine 4-5x/week   Drug use: No   Sexual activity: Not Currently  Other Topics Concern   Not on file  Social History Narrative   Household: pt and husband   3 children, one in [place] (PhD)                Social Determinants of Health   Financial Resource Strain: Low Risk    Difficulty of Paying Living Expenses: Not hard at all  Food Insecurity: No Food Insecurity   Worried About Programme researcher, broadcasting/film/video in the Last Year: Never true   Barista in the Last Year: Never true  Transportation Needs: No Transportation Needs   Lack of Transportation (Medical): No   Lack of Transportation (Non-Medical): No  Physical Activity: Sufficiently Active   Days of Exercise per Week: 7 days   Minutes of Exercise per Session: 30 min  Stress: No Stress Concern Present   Feeling of Stress : Not at all  Social Connections: Moderately Integrated   Frequency of Communication with Friends and Family: More than three times a  week   Frequency of Social Gatherings with Friends and Family: Once a week   Attends Religious Services: Never   Database administratorActive Member of Clubs or Organizations: Yes   Attends Engineer, structuralClub or Organization Meetings: More than 4 times per year   Marital Status: Married    Tobacco Counseling Counseling given: Not Answered   Clinical Intake:  Pre-visit preparation completed: Yes  Pain : No/denies pain     Nutritional Status: BMI of 19-24  Normal Nutritional Risks: None Diabetes: No  How often do you need to have someone help you when you read instructions, pamphlets, or other written materials from your doctor or pharmacy?: 1 - Never  Diabetic?No  Interpreter Needed?: No  Information entered by :: Linda SalesMartha Rianna Lukes LPN   Activities of Daily Living In your present state of health, do you have any difficulty performing the following activities: 04/16/2021  Hearing? Y  Comment hrearing aids  Vision? N  Difficulty concentrating or making decisions? N  Walking or climbing stairs? N  Dressing or bathing? N  Doing errands, shopping? N  Preparing Food and eating ? N  Using the Toilet? N  In the past six months, have you accidently leaked urine? N  Do you have problems with  loss of bowel control? N  Managing your Medications? N  Managing your Finances? N  Housekeeping or managing your Housekeeping? N  Some recent data might be hidden    Patient Care Team: Wanda PlumpPaz, Jose E, MD as PCP - General (Internal Medicine) Larence PenningJicha, John, OD as Referring Physician (Optometry) Jodi GeraldsGraves, John, MD as Consulting Physician (Orthopedic Surgery) Mikki SanteeBhatti, Sokun, MD (Inactive) as Consulting Physician (Allergy and Immunology) Windy CarinaGray, Erin J, PA-C (Inactive) as Physician Assistant (Rheumatology)  Indicate any recent Medical Services you may have received from other than [facility] providers in the past year (date may be approximate).     Assessment:   This is a routine wellness examination for Linda JohnsKathleen.  Hearing/Vision screen Hearing Screening - Comments:: Bilateral hearing aids Vision Screening - Comments:: Last eye exam-01/2020-Dr. Mardella LaymanLindsey  Dietary issues and exercise activities discussed: Current Exercise Habits: Home exercise routine, Type of exercise: walking;yoga, Time (Minutes): 30, Frequency (Times/Week): 7, Weekly Exercise (Minutes/Week): 210, Intensity: Mild, Exercise limited by: None identified   Goals Addressed             This Visit's Progress    Patient Stated       Increase exercise/walking        Depression Screen PHQ 2/9 Scores 04/16/2021 12/05/2020 12/17/2019 12/15/2018 12/12/2017 11/25/2016 07/05/2016  PHQ - 2 Score 0 2 0 0 0 0 0  PHQ- 9 Score - 5 - - - - -    Fall Risk Fall Risk  04/16/2021 12/05/2020 12/17/2019 12/15/2018 12/12/2017  Falls in the past year? 0 0 0 0 No  Number falls in past yr: 0 0 0 - -  Injury with Fall? 0 0 0 - -  Follow up Falls prevention discussed - Education provided;Falls prevention discussed - -    FALL RISK PREVENTION PERTAINING TO THE HOME:  Any stairs in or around the home? Yes  If so, are there any without handrails? No  Home free of loose throw rugs in walkways, pet beds, electrical cords, etc? Yes  Adequate lighting in your home to  reduce risk of falls? Yes   ASSISTIVE DEVICES UTILIZED TO PREVENT FALLS:  Life alert? No  Use of a cane, walker or w/c? No  Grab bars in the bathroom? Yes  Shower  chair or bench in shower? No  Elevated toilet seat or a handicapped toilet? No   TIMED UP AND GO:  Was the test performed? No . Phone visit   Cognitive Function:Normal cognitive status assessed by this Nurse Health Advisor. No abnormalities found.   MMSE - Mini Mental State Exam 11/25/2016  Orientation to time 5  Orientation to Place 5  Registration 3  Attention/ Calculation 5  Recall 3  Language- name 2 objects 2  Language- repeat 1  Language- follow 3 step command 3  Language- read & follow direction 1  Write a sentence 1  Copy design 1  Total score 30        Immunizations Immunization History  Administered Date(s) Administered   Influenza Inj Mdck Quad Pf 07/13/2019, 07/14/2020   Influenza, Quadrivalent, Recombinant, Inj, Pf 08/15/2016, 08/05/2017, 07/10/2018   Moderna Sars-Covid-2 Vaccination 12/05/2019, 01/02/2020, 08/22/2020, 02/19/2021   Pneumococcal Conjugate-13 05/13/2019   Pneumococcal Polysaccharide-23 11/16/2019   Td 02/04/2009    TDAP status: Due, Education has been provided regarding the importance of this vaccine. Advised may receive this vaccine at local pharmacy or Health Dept. Aware to provide a copy of the vaccination record if obtained from local pharmacy or Health Dept. Verbalized acceptance and understanding.  Flu Vaccine status: Up to date  Pneumococcal vaccine status: Up to date  Covid-19 vaccine status: Completed vaccines  Qualifies for Shingles Vaccine? Yes   Zostavax completed No   Shingrix Completed?: No.    Education has been provided regarding the importance of this vaccine. Patient has been advised to call insurance company to determine out of pocket expense if they have not yet received this vaccine. Advised may also receive vaccine at local pharmacy or Health Dept.  Verbalized acceptance and understanding.  Screening Tests Health Maintenance  Topic Date Due   Zoster Vaccines- Shingrix (1 of 2) Never done   TETANUS/TDAP  02/05/2019   COVID-19 Vaccine (5 - Booster for Moderna series) 06/22/2021   MAMMOGRAM  02/19/2022   DEXA SCAN  Completed   Hepatitis C Screening  Completed   PNA vac Low Risk Adult  Completed   HPV VACCINES  Aged Out    Health Maintenance  Health Maintenance Due  Topic Date Due   Zoster Vaccines- Shingrix (1 of 2) Never done   TETANUS/TDAP  02/05/2019    Colorectal cancer screening: No longer required.   Mammogram status: Completed Bilateral 02/19/2021. Repeat every year  Bone Density status: Declined  Lung Cancer Screening: (Low Dose CT Chest recommended if Age 76-80 years, 30 pack-year currently smoking OR have quit w/in 15years.) does not qualify.    Additional Screening:  Hepatitis C Screening: Completed 08/10/2015  Vision Screening: Recommended annual ophthalmology exams for early detection of glaucoma and other disorders of the eye. Is the patient up to date with their annual eye exam?  No  Who is the provider or what is the name of the office in which the patient attends annual eye exams? Dr. Mardella Layman   Dental Screening: Recommended annual dental exams for proper oral hygiene  Community Resource Referral / Chronic Care Management: CRR required this visit?  No   CCM required this visit?  No      Plan:     I have personally reviewed and noted the following in the patient's chart:   Medical and social history Use of alcohol, tobacco or illicit drugs  Current medications and supplements including opioid prescriptions.  Functional ability and status Nutritional status Physical activity Advanced  directives List of other physicians Hospitalizations, surgeries, and ER visits in previous 12 months Vitals Screenings to include cognitive, depression, and falls Referrals and appointments  In addition, I  have reviewed and discussed with patient certain preventive protocols, quality metrics, and best practice recommendations. A written personalized care plan for preventive services as well as general preventive health recommendations were provided to patient.   Due to this being a telephonic visit, the after visit summary with patients personalized plan was offered to patient via mail or my-chart. Patient would like to access on my-chart.   Roanna Raider, LPN   [id number]/[id number]  Nurse health Advisor  Nurse Notes: None

## 2021-04-16 ENCOUNTER — Ambulatory Visit (INDEPENDENT_AMBULATORY_CARE_PROVIDER_SITE_OTHER): Payer: Medicare PPO

## 2021-04-16 VITALS — Ht 64.0 in | Wt 124.4 lb

## 2021-04-16 DIAGNOSIS — Z0000 Encounter for general adult medical examination without abnormal findings: Secondary | ICD-10-CM | POA: Diagnosis not present

## 2021-04-16 NOTE — Patient Instructions (Signed)
Linda Jefferson , Thank you for taking time to complete your Medicare Wellness Visit. I appreciate your ongoing commitment to your health goals. Please review the following plan we discussed and let me know if I can assist you in the future.   Screening recommendations/referrals: Colonoscopy: No longer required Mammogram: Completed 02/19/2021-Due 02/19/2022 Bone Density: Declined. Please call the office to schedule if you change your mind. Recommended yearly ophthalmology/optometry visit for glaucoma screening and checkup Recommended yearly dental visit for hygiene and checkup  Vaccinations: Influenza vaccine: Up to date Pneumococcal vaccine: Up to date Tdap vaccine: Discuss with pharmacy Shingles vaccine: Discuss with pharmacy   Covid-19:Up to date  Advanced directives: Copy in chart  Conditions/risks identified: See problem list  Next appointment: Follow up in one year for your annual wellness visit    Preventive Care 65 Years and Older, Female Preventive care refers to lifestyle choices and visits with your health care provider that can promote health and wellness. What does preventive care include? A yearly physical exam. This is also called an annual well check. Dental exams once or twice a year. Routine eye exams. Ask your health care provider how often you should have your eyes checked. Personal lifestyle choices, including: Daily care of your teeth and gums. Regular physical activity. Eating a healthy diet. Avoiding tobacco and drug use. Limiting alcohol use. Practicing safe sex. Taking low-dose aspirin every day. Taking vitamin and mineral supplements as recommended by your health care provider. What happens during an annual well check? The services and screenings done by your health care provider during your annual well check will depend on your age, overall health, lifestyle risk factors, and family history of disease. Counseling  Your health care provider may ask you  questions about your: Alcohol use. Tobacco use. Drug use. Emotional well-being. Home and relationship well-being. Sexual activity. Eating habits. History of falls. Memory and ability to understand (cognition). Work and work Astronomer. Reproductive health. Screening  You may have the following tests or measurements: Height, weight, and BMI. Blood pressure. Lipid and cholesterol levels. These may be checked every 5 years, or more frequently if you are over 35 years old. Skin check. Lung cancer screening. You may have this screening every year starting at age 69 if you have a 30-pack-year history of smoking and currently smoke or have quit within the past 15 years. Fecal occult blood test (FOBT) of the stool. You may have this test every year starting at age 47. Flexible sigmoidoscopy or colonoscopy. You may have a sigmoidoscopy every 5 years or a colonoscopy every 10 years starting at age 18. Hepatitis C blood test. Hepatitis B blood test. Sexually transmitted disease (STD) testing. Diabetes screening. This is done by checking your blood sugar (glucose) after you have not eaten for a while (fasting). You may have this done every 1-3 years. Bone density scan. This is done to screen for osteoporosis. You may have this done starting at age 62. Mammogram. This may be done every 1-2 years. Talk to your health care provider about how often you should have regular mammograms. Talk with your health care provider about your test results, treatment options, and if necessary, the need for more tests. Vaccines  Your health care provider may recommend certain vaccines, such as: Influenza vaccine. This is recommended every year. Tetanus, diphtheria, and acellular pertussis (Tdap, Td) vaccine. You may need a Td booster every 10 years. Zoster vaccine. You may need this after age 18. Pneumococcal 13-valent conjugate (PCV13) vaccine. One dose  is recommended after age 9. Pneumococcal polysaccharide  (PPSV23) vaccine. One dose is recommended after age 89. Talk to your health care provider about which screenings and vaccines you need and how often you need them. This information is not intended to replace advice given to you by your health care provider. Make sure you discuss any questions you have with your health care provider. Document Released: 10/20/2015 Document Revised: 06/12/2016 Document Reviewed: 07/25/2015 Elsevier Interactive Patient Education  2017 [employer] Prevention in the Home Falls can cause injuries. They can happen to people of all ages. There are many things you can do to make your home safe and to help prevent falls. What can I do on the outside of my home? Regularly fix the edges of walkways and driveways and fix any cracks. Remove anything that might make you trip as you walk through a door, such as a raised step or threshold. Trim any bushes or trees on the path to your home. Use bright outdoor lighting. Clear any walking paths of anything that might make someone trip, such as rocks or tools. Regularly check to see if handrails are loose or broken. Make sure that both sides of any steps have handrails. Any raised decks and porches should have guardrails on the edges. Have any leaves, snow, or ice cleared regularly. Use sand or salt on walking paths during winter. Clean up any spills in your garage right away. This includes oil or grease spills. What can I do in the bathroom? Use night lights. Install grab bars by the toilet and in the tub and shower. Do not use towel bars as grab bars. Use non-skid mats or decals in the tub or shower. If you need to sit down in the shower, use a plastic, non-slip stool. Keep the floor dry. Clean up any water that spills on the floor as soon as it happens. Remove soap buildup in the tub or shower regularly. Attach bath mats securely with double-sided non-slip rug tape. Do not have throw rugs and other things on the  floor that can make you trip. What can I do in the bedroom? Use night lights. Make sure that you have a light by your bed that is easy to reach. Do not use any sheets or blankets that are too big for your bed. They should not hang down onto the floor. Have a firm chair that has side arms. You can use this for support while you get dressed. Do not have throw rugs and other things on the floor that can make you trip. What can I do in the kitchen? Clean up any spills right away. Avoid walking on wet floors. Keep items that you use a lot in easy-to-reach places. If you need to reach something above you, use a strong step stool that has a grab bar. Keep electrical cords out of the way. Do not use floor polish or wax that makes floors slippery. If you must use wax, use non-skid floor wax. Do not have throw rugs and other things on the floor that can make you trip. What can I do with my stairs? Do not leave any items on the stairs. Make sure that there are handrails on both sides of the stairs and use them. Fix handrails that are broken or loose. Make sure that handrails are as long as the stairways. Check any carpeting to make sure that it is firmly attached to the stairs. Fix any carpet that is loose or worn. Avoid  having throw rugs at the top or bottom of the stairs. If you do have throw rugs, attach them to the floor with carpet tape. Make sure that you have a light switch at the top of the stairs and the bottom of the stairs. If you do not have them, ask someone to add them for you. What else can I do to help prevent falls? Wear shoes that: Do not have high heels. Have rubber bottoms. Are comfortable and fit you well. Are closed at the toe. Do not wear sandals. If you use a stepladder: Make sure that it is fully opened. Do not climb a closed stepladder. Make sure that both sides of the stepladder are locked into place. Ask someone to hold it for you, if possible. Clearly mark and make  sure that you can see: Any grab bars or handrails. First and last steps. Where the edge of each step is. Use tools that help you move around (mobility aids) if they are needed. These include: Canes. Walkers. Scooters. Crutches. Turn on the lights when you go into a dark area. Replace any light bulbs as soon as they burn out. Set up your furniture so you have a clear path. Avoid moving your furniture around. If any of your floors are uneven, fix them. If there are any pets around you, be aware of where they are. Review your medicines with your doctor. Some medicines can make you feel dizzy. This can increase your chance of falling. Ask your doctor what other things that you can do to help prevent falls. This information is not intended to replace advice given to you by your health care provider. Make sure you discuss any questions you have with your health care provider. Document Released: 07/20/2009 Document Revised: 02/29/2016 Document Reviewed: 10/28/2014 Elsevier Interactive Patient Education  2017 [employer].

## 2021-04-26 DIAGNOSIS — F411 Generalized anxiety disorder: Secondary | ICD-10-CM | POA: Diagnosis not present

## 2021-04-27 ENCOUNTER — Telehealth: Payer: Self-pay | Admitting: Gastroenterology

## 2021-04-27 NOTE — Telephone Encounter (Signed)
Inbound call from pt requesting a call back asking if there is a prescription she can use for her hemorrhoids. Please advise. Thanks

## 2021-04-27 NOTE — Telephone Encounter (Signed)
Please send Rx for anusol cream twice daily per rectum X 7 days. If not covered by insurance, can use Preparation H cream OTC twice daily as needed. Thanks

## 2021-04-27 NOTE — Telephone Encounter (Signed)
Dr Lavon Paganini please advise on what you'd like to send her

## 2021-04-30 MED ORDER — HYDROCORTISONE (PERIANAL) 2.5 % EX CREA: 1.0000 "application " | TOPICAL_CREAM | Freq: Two times a day (BID) | CUTANEOUS | 1 refills | Status: DC

## 2021-04-30 NOTE — Telephone Encounter (Signed)
Anusol sent in for patient , called pt to inform,

## 2021-05-02 DIAGNOSIS — S80211A Abrasion, right knee, initial encounter: Secondary | ICD-10-CM | POA: Diagnosis not present

## 2021-05-02 DIAGNOSIS — S80212A Abrasion, left knee, initial encounter: Secondary | ICD-10-CM | POA: Diagnosis not present

## 2021-05-02 DIAGNOSIS — M25561 Pain in right knee: Secondary | ICD-10-CM | POA: Diagnosis not present

## 2021-05-02 DIAGNOSIS — M7989 Other specified soft tissue disorders: Secondary | ICD-10-CM | POA: Diagnosis not present

## 2021-05-02 DIAGNOSIS — W1789XA Other fall from one level to another, initial encounter: Secondary | ICD-10-CM | POA: Diagnosis not present

## 2021-05-02 DIAGNOSIS — M25461 Effusion, right knee: Secondary | ICD-10-CM | POA: Diagnosis not present

## 2021-05-02 DIAGNOSIS — Z9181 History of falling: Secondary | ICD-10-CM | POA: Diagnosis not present

## 2021-05-07 DIAGNOSIS — S8002XD Contusion of left knee, subsequent encounter: Secondary | ICD-10-CM | POA: Diagnosis not present

## 2021-05-07 DIAGNOSIS — M25561 Pain in right knee: Secondary | ICD-10-CM | POA: Diagnosis not present

## 2021-05-09 ENCOUNTER — Encounter: Payer: Self-pay | Admitting: Internal Medicine

## 2021-05-12 ENCOUNTER — Other Ambulatory Visit: Payer: Self-pay | Admitting: Internal Medicine

## 2021-05-14 DIAGNOSIS — F411 Generalized anxiety disorder: Secondary | ICD-10-CM | POA: Diagnosis not present

## 2021-05-21 DIAGNOSIS — F411 Generalized anxiety disorder: Secondary | ICD-10-CM | POA: Diagnosis not present

## 2021-05-28 DIAGNOSIS — F411 Generalized anxiety disorder: Secondary | ICD-10-CM | POA: Diagnosis not present

## 2021-06-04 DIAGNOSIS — F411 Generalized anxiety disorder: Secondary | ICD-10-CM | POA: Diagnosis not present

## 2021-06-08 ENCOUNTER — Ambulatory Visit: Payer: Medicare PPO | Admitting: Internal Medicine

## 2021-06-18 DIAGNOSIS — F411 Generalized anxiety disorder: Secondary | ICD-10-CM | POA: Diagnosis not present

## 2021-06-18 DIAGNOSIS — M25561 Pain in right knee: Secondary | ICD-10-CM | POA: Diagnosis not present

## 2021-06-19 DIAGNOSIS — L661 Lichen planopilaris: Secondary | ICD-10-CM | POA: Diagnosis not present

## 2021-06-19 DIAGNOSIS — L658 Other specified nonscarring hair loss: Secondary | ICD-10-CM | POA: Diagnosis not present

## 2021-06-25 DIAGNOSIS — F411 Generalized anxiety disorder: Secondary | ICD-10-CM | POA: Diagnosis not present

## 2021-07-17 ENCOUNTER — Other Ambulatory Visit: Payer: Self-pay

## 2021-07-17 ENCOUNTER — Encounter: Payer: Self-pay | Admitting: Internal Medicine

## 2021-07-17 ENCOUNTER — Ambulatory Visit (INDEPENDENT_AMBULATORY_CARE_PROVIDER_SITE_OTHER): Payer: Medicare PPO

## 2021-07-17 DIAGNOSIS — Z23 Encounter for immunization: Secondary | ICD-10-CM | POA: Diagnosis not present

## 2021-07-17 NOTE — Progress Notes (Signed)
Linda Jefferson is a 75 y.o. female presents to the office today for HD flu shot injections, per physician's orders. Original order: 07/17/2021 HD Flu 0.5 mL  (dose),  IM (route) was administered R Deltoid  (location) today. Patient tolerated injection.  Given VIS and Consent form.    Creft, Feliberto Harts

## 2021-07-23 ENCOUNTER — Other Ambulatory Visit: Payer: Self-pay

## 2021-07-23 ENCOUNTER — Encounter: Payer: Self-pay | Admitting: Obstetrics & Gynecology

## 2021-07-23 ENCOUNTER — Ambulatory Visit (INDEPENDENT_AMBULATORY_CARE_PROVIDER_SITE_OTHER): Payer: Medicare PPO | Admitting: Obstetrics & Gynecology

## 2021-07-23 VITALS — BP 137/67 | HR 75 | Wt 124.0 lb

## 2021-07-23 DIAGNOSIS — N362 Urethral caruncle: Secondary | ICD-10-CM | POA: Diagnosis not present

## 2021-07-23 DIAGNOSIS — N904 Leukoplakia of vulva: Secondary | ICD-10-CM

## 2021-07-23 DIAGNOSIS — F411 Generalized anxiety disorder: Secondary | ICD-10-CM | POA: Diagnosis not present

## 2021-07-23 MED ORDER — ESTRACE 0.1 MG/GM VA CREA: 0.5000 g | TOPICAL_CREAM | Freq: Every day | VAGINAL | 2 refills | Status: DC

## 2021-07-23 NOTE — Progress Notes (Signed)
History:  75 y.o. [id number] here today for f/u of vulvar dysplasia and urethral caruncle. Pt denies itching or other sx. She reports that she has been using the cream every other day in a small amount and feel fine with this. She is due for her mammogram in Jan and will get it scheduled later today.   Pt has had flu vaccine and initial COVID booster. She will get the next booster soon.   The following portions of the patient's history were reviewed and updated as appropriate: allergies, current medications, past family history, past medical history, past social history, past surgical history and problem list.  Review of Systems:  Pertinent items are noted in HPI.    Objective:  Physical Exam Blood pressure 137/67, pulse 75, weight 124 lb (56.2 kg).  CONSTITUTIONAL: Well-developed, well-nourished female in no acute distress.  HENT:  Normocephalic, atraumatic EYES: Conjunctivae and EOM are normal. No scleral icterus.  NECK: Normal range of motion SKIN: Skin is warm and dry. No rash noted. Not diaphoretic.No pallor. NEUROLGIC: Alert and oriented to person, place, and time. Normal coordination.  Pelvic: Normal appearing external genitalia; menopausal atrophy noted. No lesions or excoriations noted. Speculum exam not performed.    Assessment & Plan:  Ky was seen today for follow-up.  Diagnoses and all orders for this visit:  Lichen sclerosus of female genitalia  Urethral caruncle -     ESTRACE VAGINAL 0.1 MG/GM vaginal cream; Place 0.5 g vaginally daily. Apply to affected area.   F/u in 6 months or sooner prn Schedule mammogram.   Total face-to-face time with patient, review of chart and coordination of care was 20 min.    Elesha Thedford L. Harraway-Smith, M.D., Evern Core

## 2021-07-24 ENCOUNTER — Encounter: Payer: Self-pay | Admitting: Internal Medicine

## 2021-07-25 ENCOUNTER — Other Ambulatory Visit: Payer: Self-pay | Admitting: Gastroenterology

## 2021-08-02 ENCOUNTER — Encounter: Payer: Self-pay | Admitting: Internal Medicine

## 2021-08-02 ENCOUNTER — Other Ambulatory Visit: Payer: Self-pay | Admitting: Internal Medicine

## 2021-08-02 NOTE — Telephone Encounter (Signed)
Requesting: clonazepam 0.5mg  Contract: 12/05/2020 UDS: 12/05/2020 Last Visit: 12/05/2020 Next Visit: 12/06/2021 Last Refill: 03/15/2021 #120 and 2RF  Please Advise

## 2021-08-03 ENCOUNTER — Other Ambulatory Visit: Payer: Self-pay | Admitting: *Deleted

## 2021-08-03 MED ORDER — CLONAZEPAM 0.5 MG PO TABS: 0.5000 mg | ORAL_TABLET | Freq: Four times a day (QID) | ORAL | 2 refills | Status: DC | PRN

## 2021-08-03 MED ORDER — DIPHENOXYLATE-ATROPINE 2.5-0.025 MG PO TABS: 1.0000 | ORAL_TABLET | Freq: Every day | ORAL | 3 refills | Status: DC

## 2021-08-03 NOTE — Telephone Encounter (Signed)
PDMP okay, Rx sent 

## 2021-08-13 DIAGNOSIS — F411 Generalized anxiety disorder: Secondary | ICD-10-CM | POA: Diagnosis not present

## 2021-08-24 ENCOUNTER — Other Ambulatory Visit: Payer: Self-pay

## 2021-08-27 DIAGNOSIS — F411 Generalized anxiety disorder: Secondary | ICD-10-CM | POA: Diagnosis not present

## 2021-09-10 DIAGNOSIS — F411 Generalized anxiety disorder: Secondary | ICD-10-CM | POA: Diagnosis not present

## 2021-09-14 ENCOUNTER — Encounter: Payer: Self-pay | Admitting: Internal Medicine

## 2021-09-17 MED ORDER — SIMVASTATIN 20 MG PO TABS
20.0000 mg | ORAL_TABLET | Freq: Every day | ORAL | 1 refills | Status: DC
Start: 2021-09-17 — End: 2022-03-15

## 2021-10-08 DIAGNOSIS — F411 Generalized anxiety disorder: Secondary | ICD-10-CM | POA: Diagnosis not present

## 2021-10-22 DIAGNOSIS — F411 Generalized anxiety disorder: Secondary | ICD-10-CM | POA: Diagnosis not present

## 2021-11-05 DIAGNOSIS — F411 Generalized anxiety disorder: Secondary | ICD-10-CM | POA: Diagnosis not present

## 2021-12-03 DIAGNOSIS — F411 Generalized anxiety disorder: Secondary | ICD-10-CM | POA: Diagnosis not present

## 2021-12-04 NOTE — Progress Notes (Deleted)
Subjective:   Linda Jefferson is a 76 y.o. female who presents for Medicare Annual (Subsequent) preventive examination.  Review of Systems    ***       Objective:    There were no vitals filed for this visit. There is no height or weight on file to calculate BMI.  Advanced Directives 04/16/2021 12/17/2019 12/15/2018 12/12/2017 11/25/2016 11/03/2016 10/29/2016  Does Patient Have a Medical Advance Directive? Yes Yes Yes Yes Yes - Yes  Type of Advance Directive Healthcare Power of [place];Living will Healthcare Power of [place];Living will Healthcare Power of [place];Living will Healthcare Power of [place];Living will Living will;Healthcare Power of Attorney Living will;Healthcare Power of [employer] Power of [place];Living will  Does patient want to make changes to medical advance directive? - No - Patient declined No - Patient declined - No - Patient declined - -  Copy of Healthcare Power of Attorney in Chart? Yes - validated most recent copy scanned in chart (See row information) No - copy requested No - copy requested No - copy requested No - copy requested No - copy requested -    Current Medications (verified) Outpatient Encounter Medications as of 12/06/2021  Medication Sig   amLODipine-benazepril (LOTREL) 5-10 MG capsule Take 1 capsule by mouth daily.   Artificial Tear Ointment (DRY EYES OP) Place 2 drops into both eyes as needed (for dry eyes).   azelastine (ASTELIN) 0.1 % nasal spray Place 2 sprays into both nostrils 2 (two) times daily.   clonazePAM (KLONOPIN) 0.5 MG tablet Take 1 tablet (0.5 mg total) by mouth 4 (four) times daily as needed for anxiety.   diclofenac Sodium (VOLTAREN) 1 % GEL Apply 2 g topically 4 (four) times daily as needed.   diphenoxylate-atropine (LOMOTIL) 2.5-0.025 MG tablet Take 1 tablet by mouth daily.   ESTRACE VAGINAL 0.1 MG/GM vaginal cream Place 0.5 g vaginally daily. Apply to affected area.   finasteride (PROSCAR) 5 MG tablet Take 1 tablet  (5 mg total) by mouth daily.   hydrocortisone (ANUSOL-HC) 2.5 % rectal cream Place 1 application rectally 2 (two) times daily. For 7 days   lidocaine-hydrocortisone (ANAMANTEL HC) 3-0.5 % CREA Place 1 Applicatorful rectally 2 (two) times daily. As needed   Multiple Vitamin (MULTIVITAMIN) capsule Take 1 capsule by mouth daily.   raloxifene (EVISTA) 60 MG tablet Take 1 tablet (60 mg total) by mouth daily.   simvastatin (ZOCOR) 20 MG tablet Take 1 tablet (20 mg total) by mouth at bedtime.   No facility-administered encounter medications on file as of 12/06/2021.    Allergies (verified) Mushroom extract complex, Shellfish allergy, Oxycodone, Eggs or egg-derived products, Pseudoeph-doxylamine-dm-apap, and Tape   History: Past Medical History:  Diagnosis Date   Anxiety    Carotid artery disease (HCC)    per u/s 01/2009- repeated u/s 7/11 "mild dz, no stenosis"   Colitis    Dyslipidemia    Food allergy    mushrooms   Gallstones    Hyperlipidemia    Hypertension    IBS (irritable bowel syndrome)    LBP (low back pain) 3/11   after a local injection, f/u [facility]   Normal cardiac stress test 04/2009   Osteopenia    Seasonal allergies    Syncope 01/2009   holter atrail tachycardia   Wears hearing aid    Past Surgical History:  Procedure Laterality Date   BUNIONECTOMY Bilateral 03/2013   CARPAL TUNNEL RELEASE Right    CATARACT EXTRACTION Bilateral 2020   Dr.Beavis  CHOLECYSTECTOMY     TOTAL SHOULDER ARTHROPLASTY Left 04/2016   TRIGGER FINGER RELEASE  03/31/2012   Procedure: RELEASE TRIGGER FINGER/A-1 PULLEY;  Surgeon: Wyn Forster., MD;  Location: [facility];  Service: Orthopedics;  Laterality: Right;  right index   Family History  Problem Relation Age of Onset   Hypertension Father    Other Father        cerebral hemorrhage   Coronary artery disease Mother 71   Stroke Mother    Diabetes Mother    Breast cancer Mother    Hypertension Mother     Hypertension Sister    Breast cancer Maternal Aunt        x 2   Breast cancer Paternal Aunt    Colon cancer Neg Hx    Social History   Socioeconomic History   Marital status: Married    Spouse name: Not on file   Number of children: 3   Years of education: Not on file   Highest education level: Not on file  Occupational History   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[employer]  Tobacco Use   Smoking status: Never   Smokeless tobacco: Never  Vaping Use   Vaping Use: Never used  Substance and Sexual Activity   Alcohol use: Yes    Alcohol/week: 2.0 standard drinks    Types: 2 Glasses of wine per week    Comment: socially wine 4-5x/week   Drug use: No   Sexual activity: Not Currently  Other Topics Concern   Not on file  Social History Narrative   Household: pt and husband   3 children, one in [place] (PhD)               Social Determinants of Health   Financial Resource Strain: Low Risk    Difficulty of Paying Living Expenses: Not hard at all  Food Insecurity: No Food Insecurity   Worried About Programme researcher, broadcasting/film/video in the Last Year: Never true   Barista in the Last Year: Never true  Transportation Needs: No Transportation Needs   Lack of Transportation (Medical): No   Lack of Transportation (Non-Medical): No  Physical Activity: Sufficiently Active   Days of Exercise per Week: 7 days   Minutes of Exercise per Session: 30 min  Stress: No Stress Concern Present   Feeling of Stress : Not at all  Social Connections: Moderately Integrated   Frequency of Communication with Friends and Family: More than three times a week   Frequency of Social Gatherings with Friends and Family: Once a week   Attends Religious Services: Never   Database administrator or Organizations: Yes   Attends Engineer, structural: More than 4 times per year   Marital Status: Married    Tobacco Counseling Counseling given: Not Answered   Clinical  Intake:                 Diabetic?No         Activities of Daily Living In your present state of health, do you have any difficulty performing the following activities: 04/16/2021  Hearing? Y  Comment hrearing aids  Vision? N  Difficulty concentrating or making decisions? N  Walking or climbing stairs? N  Dressing or bathing? N  Doing errands, shopping? N  Preparing Food and eating ? N  Using the Toilet? N  In the past six months, have you accidently leaked urine? N  Do you have problems with loss of bowel control? N  Managing your Medications? N  Managing your Finances? N  Housekeeping or managing your Housekeeping? N  Some recent data might be hidden    Patient Care Team: Wanda Plump, MD as PCP - General (Internal Medicine) Larence Penning, OD as Referring Physician (Optometry) Jodi Geralds, MD as Consulting Physician (Orthopedic Surgery) Mikki Santee, MD (Inactive) as Consulting Physician (Allergy and Immunology) Windy Carina, PA-C (Inactive) as Physician Assistant (Rheumatology)  Indicate any recent Medical Services you may have received from other than [facility] providers in the past year (date may be approximate).     Assessment:   This is a routine wellness examination for Nathania.  Hearing/Vision screen No results found.  Dietary issues and exercise activities discussed:     Goals Addressed   None    Depression Screen PHQ 2/9 Scores 04/16/2021 12/05/2020 12/17/2019 12/15/2018 12/12/2017 11/25/2016 07/05/2016  PHQ - 2 Score 0 2 0 0 0 0 0  PHQ- 9 Score - 5 - - - - -    Fall Risk Fall Risk  04/16/2021 12/05/2020 12/17/2019 12/15/2018 12/12/2017  Falls in the past year? 0 0 0 0 No  Number falls in past yr: 0 0 0 - -  Injury with Fall? 0 0 0 - -  Follow up Falls prevention discussed - Education provided;Falls prevention discussed - -    FALL RISK PREVENTION PERTAINING TO THE HOME:  Any stairs in or around the home? {YES/NO:21197} If so, are there any without  handrails? {YES/NO:21197} Home free of loose throw rugs in walkways, pet beds, electrical cords, etc? {YES/NO:21197} Adequate lighting in your home to reduce risk of falls? {YES/NO:21197}  ASSISTIVE DEVICES UTILIZED TO PREVENT FALLS:  Life alert? {YES/NO:21197} Use of a cane, walker or w/c? {YES/NO:21197} Grab bars in the bathroom? {YES/NO:21197} Shower chair or bench in shower? {YES/NO:21197} Elevated toilet seat or a handicapped toilet? {YES/NO:21197}  TIMED UP AND GO:  Was the test performed? {YES/NO:21197}.  Length of time to ambulate 10 feet: *** sec.   {Appearance of Gait:2101803}  Cognitive Function: MMSE - Mini Mental State Exam 11/25/2016  Orientation to time 5  Orientation to Place 5  Registration 3  Attention/ Calculation 5  Recall 3  Language- name 2 objects 2  Language- repeat 1  Language- follow 3 step command 3  Language- read & follow direction 1  Write a sentence 1  Copy design 1  Total score 30        Immunizations Immunization History  Administered Date(s) Administered   Influenza Inj Mdck Quad Pf 07/13/2019, 07/14/2020, 07/17/2021   Influenza, Quadrivalent, Recombinant, Inj, Pf 08/15/2016, 08/05/2017, 07/10/2018   Moderna Covid-19 Vaccine Bivalent Booster 20yrs & up 08/17/2021   Moderna Sars-Covid-2 Vaccination 12/05/2019, 01/02/2020, 08/22/2020, 02/19/2021   Pneumococcal Conjugate-13 05/13/2019   Pneumococcal Polysaccharide-23 11/16/2019   Td 02/04/2009    {TDAP status:2101805}  Flu Vaccine status: Up to date  {Pneumococcal vaccine status:2101807}  Covid-19 vaccine status: Completed vaccines  Qualifies for Shingles Vaccine? Yes   Zostavax completed No   Shingrix Completed?: No.    Education has been provided regarding the importance of this vaccine. Patient has been advised to call insurance company to determine out of pocket expense if they have not yet received this vaccine. Advised may also receive vaccine at local pharmacy or Health  Dept. Verbalized acceptance and understanding.  Screening Tests Health Maintenance  Topic Date Due   Zoster Vaccines- Shingrix (1 of 2) Never  done   TETANUS/TDAP  02/05/2019   MAMMOGRAM  02/19/2022   Pneumonia Vaccine 54+ Years old  Completed   DEXA SCAN  Completed   COVID-19 Vaccine  Completed   Hepatitis C Screening  Completed   HPV VACCINES  Aged Out   COLONOSCOPY (Pts 45-74yrs Insurance coverage will need to be confirmed)  Discontinued    Health Maintenance  Health Maintenance Due  Topic Date Due   Zoster Vaccines- Shingrix (1 of 2) Never done   TETANUS/TDAP  02/05/2019    Colorectal cancer screening: No longer required.   Mammogram status: Completed 02/19/2021. Repeat every year  Bone Density status: Completed 11/27/16. Results reflect: Bone density results: OSTEOPOROSIS. Repeat every 2 years.  Lung Cancer Screening: (Low Dose CT Chest recommended if Age 30-80 years, 30 pack-year currently smoking OR have quit w/in 15years.) does not qualify.   Lung Cancer Screening Referral: N/A  Additional Screening:  Hepatitis C Screening: does qualify; Completed 08/10/2015  Vision Screening: Recommended annual ophthalmology exams for early detection of glaucoma and other disorders of the eye. Is the patient up to date with their annual eye exam?  {YES/NO:21197} Who is the provider or what is the name of the office in which the patient attends annual eye exams? *** If pt is not established with a provider, would they like to be referred to a provider to establish care? {YES/NO:21197}.   Dental Screening: Recommended annual dental exams for proper oral hygiene  Community Resource Referral / Chronic Care Management: CRR required this visit?  {YES/NO:21197}  CCM required this visit?  {YES/NO:21197}     Plan:     I have personally reviewed and noted the following in the patients chart:   Medical and social history Use of alcohol, tobacco or illicit drugs  Current  medications and supplements including opioid prescriptions.  Functional ability and status Nutritional status Physical activity Advanced directives List of other physicians Hospitalizations, surgeries, and ER visits in previous 12 months Vitals Screenings to include cognitive, depression, and falls Referrals and appointments  In addition, I have reviewed and discussed with patient certain preventive protocols, quality metrics, and best practice recommendations. A written personalized care plan for preventive services as well as general preventive health recommendations were provided to patient.     Salomon Mast Sumit Branham, CMA   12/04/2021   Nurse Notes: ***

## 2021-12-06 ENCOUNTER — Encounter: Payer: Medicare PPO | Admitting: Internal Medicine

## 2021-12-06 NOTE — Progress Notes (Deleted)
Subjective:   Linda Jefferson is a 76 y.o. female who presents for Medicare Annual (Subsequent) preventive examination.  Review of Systems    ***       Objective:    There were no vitals filed for this visit. There is no height or weight on file to calculate BMI.  Advanced Directives 04/16/2021 12/17/2019 12/15/2018 12/12/2017 11/25/2016 11/03/2016 10/29/2016  Does Patient Have a Medical Advance Directive? Yes Yes Yes Yes Yes - Yes  Type of Advance Directive Healthcare Power of [place];Living will Healthcare Power of [place];Living will Healthcare Power of [place];Living will Healthcare Power of [place];Living will Living will;Healthcare Power of Attorney Living will;Healthcare Power of [employer] Power of [place];Living will  Does patient want to make changes to medical advance directive? - No - Patient declined No - Patient declined - No - Patient declined - -  Copy of Healthcare Power of Attorney in Chart? Yes - validated most recent copy scanned in chart (See row information) No - copy requested No - copy requested No - copy requested No - copy requested No - copy requested -    Current Medications (verified) Outpatient Encounter Medications as of 12/06/2021  Medication Sig   amLODipine-benazepril (LOTREL) 5-10 MG capsule Take 1 capsule by mouth daily.   Artificial Tear Ointment (DRY EYES OP) Place 2 drops into both eyes as needed (for dry eyes).   azelastine (ASTELIN) 0.1 % nasal spray Place 2 sprays into both nostrils 2 (two) times daily.   clonazePAM (KLONOPIN) 0.5 MG tablet Take 1 tablet (0.5 mg total) by mouth 4 (four) times daily as needed for anxiety.   diclofenac Sodium (VOLTAREN) 1 % GEL Apply 2 g topically 4 (four) times daily as needed.   diphenoxylate-atropine (LOMOTIL) 2.5-0.025 MG tablet Take 1 tablet by mouth daily.   ESTRACE VAGINAL 0.1 MG/GM vaginal cream Place 0.5 g vaginally daily. Apply to affected area.   finasteride (PROSCAR) 5 MG tablet Take 1 tablet  (5 mg total) by mouth daily.   hydrocortisone (ANUSOL-HC) 2.5 % rectal cream Place 1 application rectally 2 (two) times daily. For 7 days   lidocaine-hydrocortisone (ANAMANTEL HC) 3-0.5 % CREA Place 1 Applicatorful rectally 2 (two) times daily. As needed   Multiple Vitamin (MULTIVITAMIN) capsule Take 1 capsule by mouth daily.   raloxifene (EVISTA) 60 MG tablet Take 1 tablet (60 mg total) by mouth daily.   simvastatin (ZOCOR) 20 MG tablet Take 1 tablet (20 mg total) by mouth at bedtime.   No facility-administered encounter medications on file as of 12/06/2021.    Allergies (verified) Mushroom extract complex, Shellfish allergy, Oxycodone, Eggs or egg-derived products, Pseudoeph-doxylamine-dm-apap, and Tape   History: Past Medical History:  Diagnosis Date   Anxiety    Carotid artery disease (HCC)    per u/s 01/2009- repeated u/s 7/11 "mild dz, no stenosis"   Colitis    Dyslipidemia    Food allergy    mushrooms   Gallstones    Hyperlipidemia    Hypertension    IBS (irritable bowel syndrome)    LBP (low back pain) 3/11   after a local injection, f/u [facility]   Normal cardiac stress test 04/2009   Osteopenia    Seasonal allergies    Syncope 01/2009   holter atrail tachycardia   Wears hearing aid    Past Surgical History:  Procedure Laterality Date   BUNIONECTOMY Bilateral 03/2013   CARPAL TUNNEL RELEASE Right    CATARACT EXTRACTION Bilateral 2020   Dr.Beavis  CHOLECYSTECTOMY     TOTAL SHOULDER ARTHROPLASTY Left 04/2016   TRIGGER FINGER RELEASE  03/31/2012   Procedure: RELEASE TRIGGER FINGER/A-1 PULLEY;  Surgeon: Wyn Forster., MD;  Location: [facility];  Service: Orthopedics;  Laterality: Right;  right index   Family History  Problem Relation Age of Onset   Hypertension Father    Other Father        cerebral hemorrhage   Coronary artery disease Mother 66   Stroke Mother    Diabetes Mother    Breast cancer Mother    Hypertension Mother     Hypertension Sister    Breast cancer Maternal Aunt        x 2   Breast cancer Paternal Aunt    Colon cancer Neg Hx    Social History   Socioeconomic History   Marital status: Married    Spouse name: Not on file   Number of children: 3   Years of education: Not on file   Highest education level: Not on file  Occupational History   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[employer]  Tobacco Use   Smoking status: Never   Smokeless tobacco: Never  Vaping Use   Vaping Use: Never used  Substance and Sexual Activity   Alcohol use: Yes    Alcohol/week: 2.0 standard drinks    Types: 2 Glasses of wine per week    Comment: socially wine 4-5x/week   Drug use: No   Sexual activity: Not Currently  Other Topics Concern   Not on file  Social History Narrative   Household: pt and husband   3 children, one in [place] (PhD)               Social Determinants of Health   Financial Resource Strain: Low Risk    Difficulty of Paying Living Expenses: Not hard at all  Food Insecurity: No Food Insecurity   Worried About Programme researcher, broadcasting/film/video in the Last Year: Never true   Barista in the Last Year: Never true  Transportation Needs: No Transportation Needs   Lack of Transportation (Medical): No   Lack of Transportation (Non-Medical): No  Physical Activity: Sufficiently Active   Days of Exercise per Week: 7 days   Minutes of Exercise per Session: 30 min  Stress: No Stress Concern Present   Feeling of Stress : Not at all  Social Connections: Moderately Integrated   Frequency of Communication with Friends and Family: More than three times a week   Frequency of Social Gatherings with Friends and Family: Once a week   Attends Religious Services: Never   Database administrator or Organizations: Yes   Attends Engineer, structural: More than 4 times per year   Marital Status: Married    Tobacco Counseling Counseling given: Not Answered   Clinical  Intake:                 Diabetic?No         Activities of Daily Living In your present state of health, do you have any difficulty performing the following activities: 04/16/2021  Hearing? Y  Comment hrearing aids  Vision? N  Difficulty concentrating or making decisions? N  Walking or climbing stairs? N  Dressing or bathing? N  Doing errands, shopping? N  Preparing Food and eating ? N  Using the Toilet? N  In the past six months, have you accidently leaked urine? N  Do you have problems with loss of bowel control? N  Managing your Medications? N  Managing your Finances? N  Housekeeping or managing your Housekeeping? N  Some recent data might be hidden    Patient Care Team: Wanda Plump, MD as PCP - General (Internal Medicine) Larence Penning, OD as Referring Physician (Optometry) Jodi Geralds, MD as Consulting Physician (Orthopedic Surgery) Mikki Santee, MD (Inactive) as Consulting Physician (Allergy and Immunology) Windy Carina, PA-C (Inactive) as Physician Assistant (Rheumatology)  Indicate any recent Medical Services you may have received from other than [facility] providers in the past year (date may be approximate).     Assessment:   This is a routine wellness examination for Linda Jefferson.  Hearing/Vision screen No results found.  Dietary issues and exercise activities discussed:     Goals Addressed   None    Depression Screen PHQ 2/9 Scores 04/16/2021 12/05/2020 12/17/2019 12/15/2018 12/12/2017 11/25/2016 07/05/2016  PHQ - 2 Score 0 2 0 0 0 0 0  PHQ- 9 Score - 5 - - - - -    Fall Risk Fall Risk  04/16/2021 12/05/2020 12/17/2019 12/15/2018 12/12/2017  Falls in the past year? 0 0 0 0 No  Number falls in past yr: 0 0 0 - -  Injury with Fall? 0 0 0 - -  Follow up Falls prevention discussed - Education provided;Falls prevention discussed - -    FALL RISK PREVENTION PERTAINING TO THE HOME:  Any stairs in or around the home? {YES/NO:21197} If so, are there any without  handrails? {YES/NO:21197} Home free of loose throw rugs in walkways, pet beds, electrical cords, etc? {YES/NO:21197} Adequate lighting in your home to reduce risk of falls? {YES/NO:21197}  ASSISTIVE DEVICES UTILIZED TO PREVENT FALLS:  Life alert? {YES/NO:21197} Use of a cane, walker or w/c? {YES/NO:21197} Grab bars in the bathroom? {YES/NO:21197} Shower chair or bench in shower? {YES/NO:21197} Elevated toilet seat or a handicapped toilet? {YES/NO:21197}  TIMED UP AND GO:  Was the test performed? {YES/NO:21197}.  Length of time to ambulate 10 feet: *** sec.   {Appearance of Gait:2101803}  Cognitive Function: MMSE - Mini Mental State Exam 11/25/2016  Orientation to time 5  Orientation to Place 5  Registration 3  Attention/ Calculation 5  Recall 3  Language- name 2 objects 2  Language- repeat 1  Language- follow 3 step command 3  Language- read & follow direction 1  Write a sentence 1  Copy design 1  Total score 30        Immunizations Immunization History  Administered Date(s) Administered   Influenza Inj Mdck Quad Pf 07/13/2019, 07/14/2020, 07/17/2021   Influenza, Quadrivalent, Recombinant, Inj, Pf 08/15/2016, 08/05/2017, 07/10/2018   Moderna Covid-19 Vaccine Bivalent Booster 29yrs & up 08/17/2021   Moderna Sars-Covid-2 Vaccination 12/05/2019, 01/02/2020, 08/22/2020, 02/19/2021   Pneumococcal Conjugate-13 05/13/2019   Pneumococcal Polysaccharide-23 11/16/2019   Td 02/04/2009    TDAP status: Due, Education has been provided regarding the importance of this vaccine. Advised may receive this vaccine at local pharmacy or Health Dept. Aware to provide a copy of the vaccination record if obtained from local pharmacy or Health Dept. Verbalized acceptance and understanding.  Flu Vaccine status: Up to date  Pneumococcal vaccine status: Up to date  Covid-19 vaccine status: Completed vaccines  Qualifies for Shingles Vaccine? Yes   Zostavax completed No   Shingrix  Completed?: No.    Education has been provided regarding the importance of this vaccine. Patient has been advised to call insurance company to  determine out of pocket expense if they have not yet received this vaccine. Advised may also receive vaccine at local pharmacy or Health Dept. Verbalized acceptance and understanding.  Screening Tests Health Maintenance  Topic Date Due   Zoster Vaccines- Shingrix (1 of 2) Never done   TETANUS/TDAP  02/05/2019   MAMMOGRAM  02/19/2022   Pneumonia Vaccine 94+ Years old  Completed   DEXA SCAN  Completed   COVID-19 Vaccine  Completed   Hepatitis C Screening  Completed   HPV VACCINES  Aged Out   COLONOSCOPY (Pts 45-6yrs Insurance coverage will need to be confirmed)  Discontinued    Health Maintenance  Health Maintenance Due  Topic Date Due   Zoster Vaccines- Shingrix (1 of 2) Never done   TETANUS/TDAP  02/05/2019    {Colorectal cancer screening:2101809}  Mammogram status: Completed bilateral 02/19/2021. Repeat every year  {Bone Density status:21018021}  Lung Cancer Screening: (Low Dose CT Chest recommended if Age 66-80 years, 30 pack-year currently smoking OR have quit w/in 15years.) does not qualify.     Additional Screening:  Hepatitis C Screening: Completed 08/10/2015  Vision Screening: Recommended annual ophthalmology exams for early detection of glaucoma and other disorders of the eye. Is the patient up to date with their annual eye exam?  {YES/NO:21197} Who is the provider or what is the name of the office in which the patient attends annual eye exams? *** If pt is not established with a provider, would they like to be referred to a provider to establish care? {YES/NO:21197}.   Dental Screening: Recommended annual dental exams for proper oral hygiene  Community Resource Referral / Chronic Care Management: CRR required this visit?  {YES/NO:21197}  CCM required this visit?  {YES/NO:21197}     Plan:     I have personally  reviewed and noted the following in the patients chart:   Medical and social history Use of alcohol, tobacco or illicit drugs  Current medications and supplements including opioid prescriptions.  Functional ability and status Nutritional status Physical activity Advanced directives List of other physicians Hospitalizations, surgeries, and ER visits in previous 12 months Vitals Screenings to include cognitive, depression, and falls Referrals and appointments  In addition, I have reviewed and discussed with patient certain preventive protocols, quality metrics, and best practice recommendations. A written personalized care plan for preventive services as well as general preventive health recommendations were provided to patient.     Roanna Raider, LPN   10/12/1094  Nurse Health Advisor  Nurse Notes: ***

## 2021-12-11 ENCOUNTER — Encounter: Payer: Self-pay | Admitting: Internal Medicine

## 2021-12-11 MED ORDER — CLONAZEPAM 0.5 MG PO TABS: 0.5000 mg | ORAL_TABLET | Freq: Four times a day (QID) | ORAL | 2 refills | Status: DC | PRN

## 2021-12-11 NOTE — Telephone Encounter (Signed)
PDMP okay. ?Please send the patient a message, I sent the prescription, no further refills without office visit ?

## 2021-12-11 NOTE — Telephone Encounter (Signed)
Requesting: clonazepam 0.5mg  ?Contract: 12/05/20 ?UDS: 12/05/2020 ?Last Visit: 12/05/2020 ?Next Visit:12/27/2021 ?Last Refill: 08/03/2021 #120 and 2RF ? ? ?Please Advise ? ?

## 2021-12-17 DIAGNOSIS — F411 Generalized anxiety disorder: Secondary | ICD-10-CM | POA: Diagnosis not present

## 2021-12-18 DIAGNOSIS — L658 Other specified nonscarring hair loss: Secondary | ICD-10-CM | POA: Diagnosis not present

## 2021-12-18 DIAGNOSIS — L661 Lichen planopilaris: Secondary | ICD-10-CM | POA: Diagnosis not present

## 2021-12-27 ENCOUNTER — Ambulatory Visit (INDEPENDENT_AMBULATORY_CARE_PROVIDER_SITE_OTHER): Payer: Medicare PPO | Admitting: Internal Medicine

## 2021-12-27 ENCOUNTER — Encounter: Payer: Self-pay | Admitting: Internal Medicine

## 2021-12-27 VITALS — BP 124/70 | HR 71 | Temp 98.0°F | Resp 16 | Ht 64.0 in | Wt 122.1 lb

## 2021-12-27 DIAGNOSIS — Z79899 Other long term (current) drug therapy: Secondary | ICD-10-CM | POA: Diagnosis not present

## 2021-12-27 DIAGNOSIS — I10 Essential (primary) hypertension: Secondary | ICD-10-CM

## 2021-12-27 DIAGNOSIS — Z0001 Encounter for general adult medical examination with abnormal findings: Secondary | ICD-10-CM

## 2021-12-27 DIAGNOSIS — M8580 Other specified disorders of bone density and structure, unspecified site: Secondary | ICD-10-CM

## 2021-12-27 DIAGNOSIS — Z780 Asymptomatic menopausal state: Secondary | ICD-10-CM

## 2021-12-27 DIAGNOSIS — F419 Anxiety disorder, unspecified: Secondary | ICD-10-CM | POA: Diagnosis not present

## 2021-12-27 DIAGNOSIS — Z0000 Encounter for general adult medical examination without abnormal findings: Secondary | ICD-10-CM

## 2021-12-27 DIAGNOSIS — E7849 Other hyperlipidemia: Secondary | ICD-10-CM

## 2021-12-27 LAB — LIPID PANEL
Cholesterol: 142 mg/dL (ref 0–200)
HDL: 66 mg/dL (ref >39.00)
LDL Cholesterol: 57 mg/dL (ref 0–99)
NonHDL: 75.78
Total CHOL/HDL Ratio: 2
Triglycerides: 94 mg/dL (ref 0.0–149.0)
VLDL: 18.8 mg/dL (ref 0.0–40.0)

## 2021-12-27 LAB — CBC WITH DIFFERENTIAL/PLATELET
Basophils Absolute: 0 10*3/uL (ref 0.0–0.1)
Basophils Relative: 0.8 % (ref 0.0–3.0)
Eosinophils Absolute: 0.1 10*3/uL (ref 0.0–0.7)
Eosinophils Relative: 3.5 % (ref 0.0–5.0)
HCT: 38.8 % (ref 36.0–46.0)
Hemoglobin: 12.8 g/dL (ref 12.0–15.0)
Lymphocytes Relative: 27.1 % (ref 12.0–46.0)
Lymphs Abs: 1.1 10*3/uL (ref 0.7–4.0)
MCHC: 33.1 g/dL (ref 30.0–36.0)
MCV: 91.3 fl (ref 78.0–100.0)
Monocytes Absolute: 0.3 10*3/uL (ref 0.1–1.0)
Monocytes Relative: 7.2 % (ref 3.0–12.0)
Neutro Abs: 2.6 10*3/uL (ref 1.4–7.7)
Neutrophils Relative %: 61.4 % (ref 43.0–77.0)
Platelets: 309 10*3/uL (ref 150.0–400.0)
RBC: 4.25 Mil/uL (ref 3.87–5.11)
RDW: 13.4 % (ref 11.5–15.5)
WBC: 4.2 10*3/uL (ref 4.0–10.5)

## 2021-12-27 LAB — COMPREHENSIVE METABOLIC PANEL
ALT: 18 U/L (ref 0–35)
AST: 27 U/L (ref 0–37)
Albumin: 4.4 g/dL (ref 3.5–5.2)
Alkaline Phosphatase: 60 U/L (ref 39–117)
BUN: 16 mg/dL (ref 6–23)
CO2: 33 mEq/L — ABNORMAL HIGH (ref 19–32)
Calcium: 10.1 mg/dL (ref 8.4–10.5)
Chloride: 101 mEq/L (ref 96–112)
Creatinine, Ser: 0.7 mg/dL (ref 0.40–1.20)
GFR: 83.75 mL/min (ref >60.00)
Glucose, Bld: 93 mg/dL (ref 70–99)
Potassium: 5.1 mEq/L (ref 3.5–5.1)
Sodium: 138 mEq/L (ref 135–145)
Total Bilirubin: 0.5 mg/dL (ref 0.2–1.2)
Total Protein: 6.8 g/dL (ref 6.0–8.3)

## 2021-12-27 LAB — TSH: TSH: 2.2 u[IU]/mL (ref 0.35–5.50)

## 2021-12-27 NOTE — Patient Instructions (Signed)
Check the  blood pressure regularly ?BP GOAL is between 110/65 and  135/85. ?If it is consistently higher or lower, let me know ? ?Take vitamin D supplements: 2000 units daily ? ?GO TO THE LAB : Get the blood work   ? ? ?GO TO THE FRONT DESK, PLEASE SCHEDULE YOUR APPOINTMENTS ?Come back for   a physical exam in 1 year  ? ?STOP BY THE FIRST FLOOR: Schedule a bone density test ?  ?

## 2021-12-27 NOTE — Progress Notes (Signed)
? ?Subjective:  ? ? Patient ID: Linda Jefferson, female    DOB: [birth year], 76 y.o.   MRN: [id number] ? ?DOS:  12/27/2021 ?Type of visit - description: CPX ? ?Here for CPX. ?No major concerns. ?She is doing some counseling and feels better emotionally. ? ?Review of Systems ? ? ?A 14 point review of systems is negative  ? ? ?Past Medical History:  ?Diagnosis Date  ? Anxiety   ? Carotid artery disease (HCC)   ? per u/s 01/2009- repeated u/s 7/11 "mild dz, no stenosis"  ? Colitis   ? Dyslipidemia   ? Food allergy   ? mushrooms  ? Gallstones   ? Hyperlipidemia   ? Hypertension   ? IBS (irritable bowel syndrome)   ? LBP (low back pain) 3/11  ? after a local injection, f/u [facility]  ? Normal cardiac stress test 04/2009  ? Osteopenia   ? Seasonal allergies   ? Syncope 01/2009  ? holter atrail tachycardia  ? Wears hearing aid   ? ? ?Past Surgical History:  ?Procedure Laterality Date  ? BUNIONECTOMY Bilateral 03/2013  ? CARPAL TUNNEL RELEASE Right   ? CATARACT EXTRACTION Bilateral 2020  ? Dr.Beavis  ? CHOLECYSTECTOMY    ? TOTAL SHOULDER ARTHROPLASTY Left 04/2016  ? TRIGGER FINGER RELEASE  03/31/2012  ? Procedure: RELEASE TRIGGER FINGER/A-1 PULLEY;  Surgeon: Wyn Forster., MD;  Location: [facility];  Service: Orthopedics;  Laterality: Right;  right index  ? ?Social History  ? ?Socioeconomic History  ? Marital status: Married  ?  Spouse name: Not on file  ? Number of children: 3  ? Years of education: Not on file  ? Highest education level: Not on file  ?Occupational History  ? Occupation: TEACHER-- retired   ?  Employer: [employer]  ?Tobacco Use  ? Smoking status: Never  ? Smokeless tobacco: Never  ?Vaping Use  ? Vaping Use: Never used  ?Substance and Sexual Activity  ? Alcohol use: Yes  ?  Alcohol/week: 2.0 standard drinks  ?  Types: 2 Glasses of wine per week  ?  Comment: socially wine 4-5x/week  ? Drug use: No  ? Sexual activity: Not Currently  ?Other Topics Concern  ? Not on file   ?Social History Narrative  ? Household: pt and husband  ? 3 children, one in [place] (PhD)  ?   ?   ?   ?   ? ?Social Determinants of Health  ? ?Financial Resource Strain: Low Risk   ? Difficulty of Paying Living Expenses: Not hard at all  ?Food Insecurity: No Food Insecurity  ? Worried About Programme researcher, broadcasting/film/video in the Last Year: Never true  ? Ran Out of Food in the Last Year: Never true  ?Transportation Needs: No Transportation Needs  ? Lack of Transportation (Medical): No  ? Lack of Transportation (Non-Medical): No  ?Physical Activity: Sufficiently Active  ? Days of Exercise per Week: 7 days  ? Minutes of Exercise per Session: 30 min  ?Stress: No Stress Concern Present  ? Feeling of Stress : Not at all  ?Social Connections: Moderately Integrated  ? Frequency of Communication with Friends and Family: More than three times a week  ? Frequency of Social Gatherings with Friends and Family: Once a week  ? Attends Religious Services: Never  ? Active Member of Clubs or Organizations: Yes  ? Attends Banker Meetings: More than 4 times per year  ?  Marital Status: Married  ?Intimate Partner Violence: Not At Risk  ? Fear of Current or Ex-Partner: No  ? Emotionally Abused: No  ? Physically Abused: No  ? Sexually Abused: No  ? ? ? ?Current Outpatient Medications  ?Medication Instructions  ? amLODipine-benazepril (LOTREL) 5-10 MG capsule 1 capsule, Oral, Daily  ? Artificial Tear Ointment (DRY EYES OP) 2 drops, Both Eyes, As needed  ? azelastine (ASTELIN) 0.1 % nasal spray 2 sprays, Each Nare, 2 times daily  ? clonazePAM (KLONOPIN) 0.5 mg, Oral, 4 times daily PRN  ? diclofenac Sodium (VOLTAREN) 2 g, Topical, 4 times daily PRN  ? diphenoxylate-atropine (LOMOTIL) 2.5-0.025 MG tablet 1 tablet, Oral, Daily  ? ESTRACE VAGINAL 0.5 g, Vaginal, Daily, Apply to affected area.  ? finasteride (PROSCAR) 5 mg, Oral, Daily  ? hydrocortisone (ANUSOL-HC) 2.5 % rectal cream 1 application., Rectal, 2 times daily, For 7 days  ?  lidocaine-hydrocortisone (ANAMANTEL HC) 3-0.5 % CREA 1 Applicatorful, Rectal, 2 times daily, As needed  ? Multiple Vitamin (MULTIVITAMIN) capsule 1 capsule, Oral, Daily,    ? raloxifene (EVISTA) 60 mg, Oral, Daily  ? simvastatin (ZOCOR) 20 mg, Oral, Daily at bedtime  ? ? ?   ?Objective:  ? Physical Exam ?BP 124/70 (BP Location: Left Arm, Patient Position: Sitting, Cuff Size: Small)   Pulse 71   Temp 98 ?F (36.7 ?C) (Oral)   Resp 16   Ht 5\' 4"  (1.626 m)   Wt 122 lb 2 oz (55.4 kg)   SpO2 98%   BMI 20.96 kg/m?  ?General: ?Well developed, NAD, BMI noted ?Neck: No  thyromegaly  ?HEENT:  ?Normocephalic . Face symmetric, atraumatic ?Breast exam: Breast symmetric, no dominant nodule, axillary areas normal, nipples normal ?Lungs:  ?CTA B ?Normal respiratory effort, no intercostal retractions, no accessory muscle use. ?Heart: RRR,  no murmur.  ?Abdomen:  ?Not distended, soft, non-tender. No rebound or rigidity.   ?Lower extremities: no pretibial edema bilaterally  ?Skin: Exposed areas without rash. Not pale. Not jaundice ?Neurologic:  ?alert & oriented X3.  ?Speech normal, gait appropriate for age and unassisted ?Strength symmetric and appropriate for age.  ?Psych: ?Cognition and judgment appear intact.  ?Cooperative with normal attention span and concentration.  ?Behavior appropriate. ?No anxious or depressed appearing. ? ?   ?Assessment   ? ? Assessment ?HTN ?Hyperlipidemia ?Osteopenia  Per dexa 2011,  2014 (Tt score  - 1.7), and 11/2016. 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 ? ?PLAN: ?Here for CPX ?HTN: BP is very good today, continue Lotrel, check CMP and CBC. ?Hyperlipidemia: On simvastatin for primary prevention of CAD. ?Osteopenia: Encouraged to continue being active, rec vitamin D supplements, on Evista for FH breast cancer, check DEXA. ?Anxiety, insomnia, depression: States that since the last time she was here she got somewhat depressed, is doing counseling and is feeling much better.  On clonazepam 4 times daily.  UDS today, RF as needed ?RTC CPX 1 year ? ? ?In addition to CPX, I addressed all  her chronic medical problems.    ?This visit occurred during the SARS-CoV-2 public health emergency.  Safety protocols were in place, including screening questions prior to the visit, additional usage of staff PPE, and extensive cleaning of exam room while observing appropriate contact time as indicated for disinfecting solutions.  ? ?

## 2021-12-28 ENCOUNTER — Encounter: Payer: Self-pay | Admitting: Internal Medicine

## 2021-12-28 NOTE — Assessment & Plan Note (Signed)
Here for CPX ?HTN: BP is very good today, continue Lotrel, check CMP and CBC. ?Hyperlipidemia: On simvastatin for primary prevention of CAD. ?Osteopenia: Encouraged to continue being active, rec vitamin D supplements, on Evista for FH breast cancer, check DEXA. ?Anxiety, insomnia, depression: States that since the last time she was here she got somewhat depressed, is doing counseling and is feeling much better.  On clonazepam 4 times daily.  UDS today, RF as needed ?RTC CPX 1 year ? ?

## 2021-12-28 NOTE — Assessment & Plan Note (Signed)
-  Td 5-10; booster rec ?-PNM 13: 05-2019; PNM 23: 11-2019 (had a local reaction) ?-COVID VAX- utd ? - Shingrex recommended   ?-Female care: Sees gyn, dx Lichen Sclerosis; no further PAPs unless pt so desires  ?Barrington Ellison FH breast ca, pt reports previous  genetic testing: negative; MMG 02/2021 ([facility]), breast exam today WNL ?-Colonoscopy:  04/18/2008, hemorrhoids. Cscope 12-2016 (done for colitis): no plyps.  10-year recall per GI letter ?-Palpable Ao: [place] neg for AAA 6-10 ?-diet-exercise: Doing great ?- advance directives : documents on file   ?-Labs:  CMP, FLP, CBC, TSH, ?

## 2021-12-29 LAB — DRUG MONITORING PANEL 375977 , URINE

## 2021-12-29 LAB — DM TEMPLATE

## 2021-12-31 DIAGNOSIS — F411 Generalized anxiety disorder: Secondary | ICD-10-CM | POA: Diagnosis not present

## 2022-01-03 ENCOUNTER — Ambulatory Visit (HOSPITAL_BASED_OUTPATIENT_CLINIC_OR_DEPARTMENT_OTHER)
Admission: RE | Admit: 2022-01-03 | Discharge: 2022-01-03 | Disposition: A | Discharge status: Home / Self Care | Payer: Medicare PPO | Source: Ambulatory Visit | Attending: Internal Medicine | Admitting: Internal Medicine

## 2022-01-03 DIAGNOSIS — Z780 Asymptomatic menopausal state: Secondary | ICD-10-CM | POA: Insufficient documentation

## 2022-01-03 DIAGNOSIS — M85851 Other specified disorders of bone density and structure, right thigh: Secondary | ICD-10-CM | POA: Diagnosis not present

## 2022-01-04 ENCOUNTER — Ambulatory Visit: Payer: Medicare PPO | Admitting: Gastroenterology

## 2022-01-14 DIAGNOSIS — F411 Generalized anxiety disorder: Secondary | ICD-10-CM | POA: Diagnosis not present

## 2022-01-18 ENCOUNTER — Other Ambulatory Visit: Payer: Self-pay | Admitting: Internal Medicine

## 2022-01-28 ENCOUNTER — Ambulatory Visit (INDEPENDENT_AMBULATORY_CARE_PROVIDER_SITE_OTHER): Payer: Medicare PPO | Admitting: Obstetrics & Gynecology

## 2022-01-28 ENCOUNTER — Encounter: Payer: Self-pay | Admitting: Obstetrics & Gynecology

## 2022-01-28 VITALS — BP 150/74 | HR 80 | Wt 119.0 lb

## 2022-01-28 DIAGNOSIS — L900 Lichen sclerosus et atrophicus: Secondary | ICD-10-CM

## 2022-01-28 DIAGNOSIS — N952 Postmenopausal atrophic vaginitis: Secondary | ICD-10-CM

## 2022-01-28 NOTE — Progress Notes (Signed)
History:  ?76 y.o. [name] here today for lichen scleroses. Pt reports no sx but, feels like there may be a dark spot under the urethra. She also reports some burning of the vulva at night.   ? ?The following portions of the patient's history were reviewed and updated as appropriate: allergies, current medications, past family history, past medical history, past social history, past surgical history and problem list. ? ?Review of Systems:  ?Pertinent items are noted in HPI. ?   ?Objective:  ?Physical Exam ?Blood pressure (!) 150/74, pulse 80, weight 119 lb (54 kg). ? ?CONSTITUTIONAL: Well-developed, well-nourished female in no acute distress.  ?HENT:  Normocephalic, atraumatic ?EYES: Conjunctivae and EOM are normal. No scleral icterus.  ?NECK: Normal range of motion ?SKIN: Skin is warm and dry. No rash noted. Not diaphoretic.No pallor. ?NEUROLGIC: Alert and oriented to person, place, and time. Normal coordination.  ?Pelvic: Normal appearing external genitalia with atrophy; no new lesions noted.  ? ?Labs and Imaging ?DG Bone Density ? ?Result Date: 01/03/2022 ?EXAM: DUAL X-RAY ABSORPTIOMETRY (DXA) FOR BONE MINERAL DENSITY IMPRESSION: Linda Jefferson Your patient Linda Jefferson completed a BMD test on 01/03/2022 using the Lunar IDXA DXA System (analysis version: 16.SP2) manufactured by [employer]. The following summarizes the results of our evaluation. [facility] PATIENT: Name: Linda Jefferson, Linda Jefferson Patient ID: [id number] Birth Date: 04/29/1946 Height: 63.5 in. Gender: Female Measured: 01/03/2022 Weight: 118.0 lbs. Indications: Advanced Age, Caucasian, Estrogen Deficiency, History of Osteopenia, Post Menopausal Fractures: Treatments: Evista, Multivitamin ASSESSMENT: The BMD measured at Femur Neck Right is 0.770 g/cm2 with a T-score of -1.9. This patient is considered osteopenic according to World Health Organization [facility]) criteria. L-3 & 4 was excluded due to degenerative changes. Compared with the prior study on, 11/27/2016 the BMD  of the total mean shows a statistically significant decrease. The scan quality is good. Site Region Measured Date Measured Age WHO YA BMD Classification T-score AP Spine L1-L2 01/03/2021 76.8 Normal -0.9 1.058 g/cm2 AP Spine L1-L2 11/27/2016 71.7 Normal -0.9 1.057 g/cm2 DualFemur Neck Right 01/03/2022 76.8 Low Bone Mass -1.9 0.770 g/cm2 DualFemur Neck Right 11/27/2016 71.7 Low Bone Mass -2.0 0.758 g/cm2 DualFemur Total Mean 01/03/2022 76.8 Low Bone Mass -1.2 0.853 g/cm2 DualFemur Total Mean 11/27/2016 71.7 Normal -1.0 0.885 g/cm2 World Health Organization [facility]) criteria for post-menopausal, Caucasian Women: Normal        T-score at or above -1 SD Low Bone Mass T-score between -1 and -2.5 SD Osteoporosis  T-score at or below -2.5 SD RECOMMENDATION: 1. All patients should optimize calcium and vitamin D intake. 2. Consider FDA-approved medical therapies in postmenopausal women and men aged 1 years and older, based on the following: a. A hip or vertebral(clinical or morphometric) fracture. b. T-Score < -2.5 at the femoral neck or spine after appropriate evaluation to exclude secondary causes c. Low bone mass (T-score between -1.0 and -2.5 at the femoral neck or spine) and a 10 year probability of a hip fracture >3% or a 10 year probability of major osteoporosis-related fracture > 20% based on the [place]-adapted WHO algorithm d. Clinical judgement and/or patient preferences may indicate treatment for people with 10-year fracture probabilities above or below these levels FOLLOW-UP: Patients with diagnosis of osteoporosis or at high risk for fracture should have regular bone mineral density tests. For patients eligible for Medicare, routine testing is allowed once every 2 years. The testing frequency can be increased to one year for patients who have rapidly progressing disease, those who are receiving or discontinuing medical  therapy to restore bone mass, or have additional risk factors. I have reviewed this report, and  agree with the above findings. Mark A. Tyron Russell, M.D. [facility] Patient: Linda Jefferson Referring Physician: Wanda Plump Birth Date: [birth year] [age] Patient ID: [id number] Height: 63.5 in. Weight: 118.0 lbs. Measured: 01/03/2022 10:05:59 AM (16 SP 4) Gender: Female Ethnicity: White Analyzed: 01/03/2022 10:13:06 AM (16 SP 4) FRAX* 10-year Probability of Fracture Based on femoral neck BMD: DualFemur (Right) Major Osteoporotic Fracture: 12.2% Hip Fracture:                3.4% Population:                  [place] (Caucasian) Risk Factors:                None *FRAX is a Armed forces logistics/support/administrative officer of the [employer] of [employer] for Metabolic Bone Disease, a World Science writer (WHO) [employer]. ASSESSMENT: The probability of a major osteoporotic fracture is 12.2% within the next ten years. The probability of a hip fracture is 3.4% within the next ten years. I have reviewed this report and agree with the above findings. Mark A. Tyron Russell, M.D. [facility] Electronically Signed   By: Ulyses Southward M.D.   On: 01/03/2022 10:57   ? ?Assessment & Plan:  ?Diagnoses and all orders for this visit: ? ?Lichen sclerosus ? ?Atrophic vaginitis ? ? Keep topical EES cream to urethra. Pt told that she could use this on the ext genialia as well.  ?Coconut oil to ext genitalia.  ? ?F/u in 6 months or sooner prn  ? ?Easter Schinke L. Harraway-Smith, M.D., FACOG ? ?

## 2022-02-11 DIAGNOSIS — F411 Generalized anxiety disorder: Secondary | ICD-10-CM | POA: Diagnosis not present

## 2022-02-21 DIAGNOSIS — Z1231 Encounter for screening mammogram for malignant neoplasm of breast: Secondary | ICD-10-CM | POA: Diagnosis not present

## 2022-02-21 LAB — HM MAMMOGRAPHY

## 2022-02-25 ENCOUNTER — Encounter: Payer: Self-pay | Admitting: Internal Medicine

## 2022-03-15 ENCOUNTER — Other Ambulatory Visit: Payer: Self-pay | Admitting: Internal Medicine

## 2022-03-18 DIAGNOSIS — M25561 Pain in right knee: Secondary | ICD-10-CM | POA: Diagnosis not present

## 2022-03-18 NOTE — Progress Notes (Signed)
03/21/2022 Almyra Brace [id number] Mar 26, 1946  Referring provider: Colon Branch, MD Primary GI doctor: Dr. Silverio Decamp  ASSESSMENT AND PLAN:   Irritable bowel syndrome with both constipation and diarrhea -     diphenoxylate-atropine (LOMOTIL) 2.5-0.025 MG tablet; Take 1 tablet by mouth daily. Well controlled with diet at this time Discussed FODMAP diet and information given.   Abnormal liver CT 10/30/2016 CT abdomen pelvis with contrast slightly irregular liver contour may present early changes of cirrhosis No ETOH weight is normal. No family history of liver issues. Normal ferritin, normal hepatitis C 2016.  No thrombocytopenia or elevated LFTs Likely over read with no clinical correlation, discussed RUQ [place] but declines at this time, will message if she changes her mind.   Hemorrhoids, unspecified hemorrhoid type 12/25/2016 colonoscopy small internal hemorrhoids otherwise normal  -     hydrocortisone (ANUSOL-HC) 2.5 % rectal cream; Place 1 Application rectally 2 (two) times daily. For 7 days -     lidocaine (XYLOCAINE) 2 % jelly; Apply 1 Application topically as needed.   Would want AB [place] History of Present Illness:  76 y.o. female  with a past medical history of hypertension, carotid artery disease, hyperlipidemia status post cholecystectomy, irritable bowel syndrome alternating constipation/diarrhea and others listed below, returns to clinic today for evaluation of IBS and increasing issues with flatulence.  Marland Kitchen 12/25/2016 colonoscopy small internal hemorrhoids otherwise normal  10/30/2016 CT abdomen pelvis with contrast colitis transverse and descending colon likely infectious versus inflammatory, or ischemic.  No bowel obstruction normal appendix, slightly irregular liver contour may present early changes of cirrhosis PAtient does not drink, weight is normal. No family history of liver issues. Normal ferritin, normal hepatitis C 2016.  02/28/2021 office visit Dr. Silverio Decamp for  IBS mixed, added high-fiber diet, MiraLAX Lomotil as needed  She states she is off miralax/fiber, she occ takes it but she has increase fruits/veggies/exercise.  She states she is flatulent when she eats cheese, occ with carbs.   12/27/2021 CBC without anemia, no thrombocytopenia.  C-Met without elevated liver function, normal kidney function  Current Medications:   Current Outpatient Medications (Endocrine & Metabolic):    raloxifene (EVISTA) 60 MG tablet, Take 1 tablet (60 mg total) by mouth daily.  Current Outpatient Medications (Cardiovascular):    amLODipine-benazepril (LOTREL) 5-10 MG capsule, TAKE 1 CAPSULE BY MOUTH EVERY DAY   simvastatin (ZOCOR) 20 MG tablet, TAKE 1 TABLET BY MOUTH EVERYDAY AT BEDTIME  Current Outpatient Medications (Respiratory):    azelastine (ASTELIN) 0.1 % nasal spray, Place 2 sprays into both nostrils 2 (two) times daily.    Current Outpatient Medications (Other):    Artificial Tear Ointment (DRY EYES OP), Place 2 drops into both eyes as needed (for dry eyes).   clonazePAM (KLONOPIN) 0.5 MG tablet, Take 1 tablet (0.5 mg total) by mouth 4 (four) times daily as needed for anxiety.   diclofenac Sodium (VOLTAREN) 1 % GEL, Apply 2 g topically 4 (four) times daily as needed.   ESTRACE VAGINAL 0.1 MG/GM vaginal cream, Place 0.5 g vaginally daily. Apply to affected area.   finasteride (PROSCAR) 5 MG tablet, Take 1 tablet (5 mg total) by mouth daily.   lidocaine (XYLOCAINE) 2 % jelly, Apply 1 Application topically as needed.   Multiple Vitamin (MULTIVITAMIN) capsule, Take 1 capsule by mouth daily.   diphenoxylate-atropine (LOMOTIL) 2.5-0.025 MG tablet, Take 1 tablet by mouth daily.   hydrocortisone (ANUSOL-HC) 2.5 % rectal cream, Place 1 Application rectally 2 (two) times daily. For  7 days  Surgical History:  She  has a past surgical history that includes Cholecystectomy; Carpal tunnel release (Right); Trigger finger release (03/31/2012); Bunionectomy (Bilateral,  03/2013); Total shoulder arthroplasty (Left, 04/2016); and Cataract extraction (Bilateral, 2020). Family History:  Her family history includes Breast cancer in her maternal aunt, mother, and paternal aunt; Coronary artery disease (age of onset: 57) in her mother; Diabetes in her mother; Hypertension in her father, mother, and sister; Other in her father; Stroke in her mother. Social History:   reports that she has never smoked. She has never used smokeless tobacco. She reports current alcohol use of about 2.0 standard drinks of alcohol per week. She reports that she does not use drugs.  Current Medications, Allergies, Past Medical History, Past Surgical History, Family History and Social History were reviewed in [employer] record.  Physical Exam: BP 140/62   Pulse 64   Ht 5' 4"  (1.626 m)   Wt 122 lb (55.3 kg)   BMI 20.94 kg/m  General :  Alert, well developed female in no acute distress Head:  Normocephalic and atraumatic. Eyes :  sclerae anicteric,conjunctive pink  Heart:  regular rate and rhythm Pulm:  Clear anteriorly; no wheezing Abdomen:   Soft, Flat AB, skin exam normal, Normal bowel sounds.  no  tenderness , without hepatomegaly. no  fluid wave, no  shifting dullness.  Extremities:   Without edema. Msk:  Symmetrical without gross deformities. Peripheral pulses intact.  Neurologic: Alert and  oriented x4;  grossly normal neurologically. without asterixis or clonus.  Skin:   without jaundice. no palmar erythema or spider angioma.   Psychiatric:  Demonstrates good judgement and reason without abnormal affect or behaviors.    Vladimir Crofts, PA-C 03/21/22

## 2022-03-21 ENCOUNTER — Encounter: Payer: Self-pay | Admitting: Physician Assistant

## 2022-03-21 ENCOUNTER — Ambulatory Visit: Payer: Medicare PPO | Admitting: Physician Assistant

## 2022-03-21 VITALS — BP 140/62 | HR 64 | Ht 64.0 in | Wt 122.0 lb

## 2022-03-21 DIAGNOSIS — R932 Abnormal findings on diagnostic imaging of liver and biliary tract: Secondary | ICD-10-CM

## 2022-03-21 DIAGNOSIS — K649 Unspecified hemorrhoids: Secondary | ICD-10-CM

## 2022-03-21 DIAGNOSIS — K582 Mixed irritable bowel syndrome: Secondary | ICD-10-CM | POA: Diagnosis not present

## 2022-03-21 MED ORDER — HYDROCORTISONE (PERIANAL) 2.5 % EX CREA: 1.0000 | TOPICAL_CREAM | Freq: Two times a day (BID) | CUTANEOUS | 1 refills | Status: AC

## 2022-03-21 MED ORDER — DIPHENOXYLATE-ATROPINE 2.5-0.025 MG PO TABS: 1.0000 | ORAL_TABLET | Freq: Every day | ORAL | 3 refills | Status: DC

## 2022-03-21 MED ORDER — LIDOCAINE HCL URETHRAL/MUCOSAL 2 % EX GEL: 1.0000 | CUTANEOUS | 1 refills | Status: DC | PRN

## 2022-03-21 NOTE — Patient Instructions (Addendum)
If you are age 77 or older, your body mass index should be between 23-30. Your Body mass index is 20.94 kg/m. If this is out of the aforementioned range listed, please consider follow up with your Primary Care Provider. ________________________________________________________  The  GI providers would like to encourage you to use [facility] to communicate with providers for non-urgent requests or questions.  Due to long hold times on the telephone, sending your provider a message by [facility] may be a faster and more efficient way to get a response.  Please allow 48 business hours for a response.  Please remember that this is for non-urgent requests.  _______________________________________________________  Refills have been sent on your Lomotil, Hydrocodone cream and Lidocaine Jelly.  Follow up with Dr. Lavon Paganini in 6 months or sooner if needed.  Thank you for entrusting me with your care and choosing [facility].  Quentin Mulling, PA-C  If you want AB [place] please let us know but labs look okay.    Abdominal bloating and discomfort may be due to intestinal sensitivity or symptoms of irritable bowel syndrome. To relieve symptoms, avoid:  Broccoli  Baked beans  Cabbage  Carbonated drinks  Cauliflower  Chewing gum  Hard candy Abdominal distention resulting from weak abdominal muscles:  Is better in the morning  Gets worse as the day progresses  Is relieved by lying down Flatulence is gas created through bacterial action in the bowel and passed rectally. Keep in mind that:  10-18 passages per day are normal  Primary gases are harmless and odorless  Noticeable smells are trace gases related to food intake Foods to AVOID that are likely to form gas include:  Milk, dairy products, and medications that contain lactose--If your body doesn't produce the enzyme (lactase) to break it down.  Certain vegetables--baked beans, cauliflower, broccoli, cabbage  Certain starches--wheat, oats,  corn, potatoes. Rice is a good substitute. Identify offending foods. Reduce or eliminate these gas-forming foods from your diet. Can look at the FODMAP diet.     FODMAP stands for fermentable oligo-, di-, mono-saccharides and polyols (1). These are the scientific terms used to classify groups of carbs that are notorious for triggering digestive symptoms like bloating, gas and stomach pain.   FODMAPs are found in a wide range of foods in varying amounts. Some foods contain just one type, while others contain several.  The main dietary sources of the four groups of FODMAPs include:  Oligosaccharides: Wheat, rye, legumes and various fruits and vegetables, such as garlic and onions.  Disaccharides: Milk, yogurt and soft cheese. Lactose is the main carb.  Monosaccharides: Various fruit including figs and mangoes, and sweeteners such as honey and agave nectar. Fructose is the main carb.  Polyols: Certain fruits and vegetables including blackberries and lychee, as well as some low-calorie sweeteners like those in sugar-free gum.   Keep a food diary. This will help you identify foods that cause symptoms. Write down: What you eat and when. What symptoms you have. When symptoms occur in relation to your meals. Avoid foods that cause symptoms. Talk with your dietitian about other ways to get the same nutrients that are in these foods. Eat your meals slowly, in a relaxed setting. Aim to eat 5-6 small meals per day. Do not skip meals. Drink enough fluids to keep your urine clear or pale yellow. If dairy products cause your symptoms to flare up, try eating less of them. You might be able to handle yogurt better than other dairy products  because it contains bacteria that help with digestion.

## 2022-03-25 DIAGNOSIS — F411 Generalized anxiety disorder: Secondary | ICD-10-CM | POA: Diagnosis not present

## 2022-03-25 DIAGNOSIS — M25561 Pain in right knee: Secondary | ICD-10-CM | POA: Diagnosis not present

## 2022-03-29 DIAGNOSIS — S83241A Other tear of medial meniscus, current injury, right knee, initial encounter: Secondary | ICD-10-CM | POA: Diagnosis not present

## 2022-03-29 DIAGNOSIS — S83271A Complex tear of lateral meniscus, current injury, right knee, initial encounter: Secondary | ICD-10-CM | POA: Diagnosis not present

## 2022-03-29 DIAGNOSIS — M25561 Pain in right knee: Secondary | ICD-10-CM | POA: Diagnosis not present

## 2022-03-29 DIAGNOSIS — R600 Localized edema: Secondary | ICD-10-CM | POA: Diagnosis not present

## 2022-04-06 ENCOUNTER — Other Ambulatory Visit: Payer: Self-pay | Admitting: Internal Medicine

## 2022-04-08 DIAGNOSIS — F411 Generalized anxiety disorder: Secondary | ICD-10-CM | POA: Diagnosis not present

## 2022-04-17 DIAGNOSIS — M25561 Pain in right knee: Secondary | ICD-10-CM | POA: Diagnosis not present

## 2022-04-22 ENCOUNTER — Ambulatory Visit (INDEPENDENT_AMBULATORY_CARE_PROVIDER_SITE_OTHER): Payer: Medicare PPO

## 2022-04-22 VITALS — Ht 64.0 in | Wt 120.0 lb

## 2022-04-22 DIAGNOSIS — Z0000 Encounter for general adult medical examination without abnormal findings: Secondary | ICD-10-CM

## 2022-04-22 NOTE — Progress Notes (Addendum)
Subjective:   Linda Jefferson is a 76 y.o. female who presents for Medicare Annual (Subsequent) preventive examination.  I connected with Mekayla today by telephone and verified that I am speaking with the correct person using two identifiers. Location patient: home Location provider: work Persons participating in the virtual visit: patient, Engineer, civil (consulting).    I discussed the limitations, risks, security and privacy concerns of performing an evaluation and management service by telephone and the availability of in person appointments. I also discussed with the patient that there may be a patient responsible charge related to this service. The patient expressed understanding and verbally consented to this telephonic visit.    Interactive audio and video telecommunications were attempted between this provider and patient, however failed, due to patient having technical difficulties OR patient did not have access to video capability.  We continued and completed visit with audio only.  Some vital signs may be absent or patient reported.   Time Spent with patient on telephone encounter: 30 minutes   Review of Systems     Cardiac Risk Factors include: advanced age (>77men, >64 women);hypertension;dyslipidemia     Objective:    Today's Vitals   04/22/22 0831  Weight: 120 lb (54.4 kg)  Height: 5\' 4"  (1.626 m)   Body mass index is 20.6 kg/m.     04/22/2022    8:38 AM 04/16/2021    9:51 AM 12/17/2019    9:34 AM 12/15/2018    2:36 PM 12/12/2017    2:36 PM 11/25/2016    2:36 PM 11/03/2016    3:00 AM  Advanced Directives  Does Patient Have a Medical Advance Directive? Yes Yes Yes Yes Yes Yes   Type of 11/05/2016 of [place];Living will Healthcare Power of [place];Living will Healthcare Power of [place];Living will Healthcare Power of [place];Living will Healthcare Power of [place];Living will Living will;Healthcare Power of Attorney Living will;Healthcare Power of  Attorney  Does patient want to make changes to medical advance directive?   No - Patient declined No - Patient declined  No - Patient declined   Copy of Healthcare Power of Attorney in Chart? Yes - validated most recent copy scanned in chart (See row information) Yes - validated most recent copy scanned in chart (See row information) No - copy requested No - copy requested No - copy requested No - copy requested No - copy requested    Current Medications (verified) Outpatient Encounter Medications as of 04/22/2022  Medication Sig   amLODipine-benazepril (LOTREL) 5-10 MG capsule TAKE 1 CAPSULE BY MOUTH EVERY DAY   Artificial Tear Ointment (DRY EYES OP) Place 2 drops into both eyes as needed (for dry eyes).   azelastine (ASTELIN) 0.1 % nasal spray Place 2 sprays into both nostrils 2 (two) times daily.   clonazePAM (KLONOPIN) 0.5 MG tablet Take 1 tablet (0.5 mg total) by mouth 4 (four) times daily as needed for anxiety.   diclofenac Sodium (VOLTAREN) 1 % GEL Apply 2 g topically 4 (four) times daily as needed.   diphenoxylate-atropine (LOMOTIL) 2.5-0.025 MG tablet Take 1 tablet by mouth daily.   ESTRACE VAGINAL 0.1 MG/GM vaginal cream Place 0.5 g vaginally daily. Apply to affected area.   finasteride (PROSCAR) 5 MG tablet Take 1 tablet (5 mg total) by mouth daily.   hydrocortisone (ANUSOL-HC) 2.5 % rectal cream Place 1 Application rectally 2 (two) times daily. For 7 days   lidocaine (XYLOCAINE) 2 % jelly Apply 1 Application topically as needed.   Multiple Vitamin (  MULTIVITAMIN) capsule Take 1 capsule by mouth daily.   raloxifene (EVISTA) 60 MG tablet TAKE 1 TABLET BY MOUTH EVERY DAY   simvastatin (ZOCOR) 20 MG tablet TAKE 1 TABLET BY MOUTH EVERYDAY AT BEDTIME   No facility-administered encounter medications on file as of 04/22/2022.    Allergies (verified) Mushroom extract complex, Shellfish allergy, Oxycodone, Eggs or egg-derived products, Pseudoeph-doxylamine-dm-apap, and Tape    History: Past Medical History:  Diagnosis Date   Anxiety    Carotid artery disease (HCC)    per u/s 01/2009- repeated u/s 7/11 "mild dz, no stenosis"   Colitis    Dyslipidemia    Food allergy    mushrooms   Gallstones    Hyperlipidemia    Hypertension    IBS (irritable bowel syndrome)    LBP (low back pain) 3/11   after a local injection, f/u [facility]   Normal cardiac stress test 04/2009   Osteopenia    Seasonal allergies    Syncope 01/2009   holter atrail tachycardia   Wears hearing aid    Past Surgical History:  Procedure Laterality Date   BUNIONECTOMY Bilateral 03/2013   CARPAL TUNNEL RELEASE Right    CATARACT EXTRACTION Bilateral 2020   Dr.Beavis   CHOLECYSTECTOMY     TOTAL SHOULDER ARTHROPLASTY Left 04/2016   TRIGGER FINGER RELEASE  03/31/2012   Procedure: RELEASE TRIGGER FINGER/A-1 PULLEY;  Surgeon: Wyn Forster., MD;  Location: [facility];  Service: Orthopedics;  Laterality: Right;  right index   Family History  Problem Relation Age of Onset   Hypertension Father    Other Father        cerebral hemorrhage   Coronary artery disease Mother 34   Stroke Mother    Diabetes Mother    Breast cancer Mother    Hypertension Mother    Hypertension Sister    Breast cancer Maternal Aunt        x 2   Breast cancer Paternal Aunt    Colon cancer Neg Hx    Social History   Socioeconomic History   Marital status: Married    Spouse name: Not on file   Number of children: 3   Years of education: Not on file   Highest education level: Not on file  Occupational History   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[employer]  Tobacco Use   Smoking status: Never   Smokeless tobacco: Never  Vaping Use   Vaping Use: Never used  Substance and Sexual Activity   Alcohol use: Yes    Alcohol/week: 2.0 standard drinks of alcohol    Types: 2 Glasses of wine per week    Comment: socially wine 4-5x/week   Drug use: No   Sexual  activity: Not Currently  Other Topics Concern   Not on file  Social History Narrative   Household: pt and husband   3 children, one in [place] (PhD)               Social Determinants of Health   Financial Resource Strain: Low Risk  (04/22/2022)   Overall Financial Resource Strain (CARDIA)    Difficulty of Paying Living Expenses: Not hard at all  Food Insecurity: No Food Insecurity (04/22/2022)   Hunger Vital Sign    Worried About Running Out of Food in the Last Year: Never true    Ran Out of Food in the Last Year: Never true  Transportation Needs: No Transportation Needs (04/22/2022)  PRAPARE - Administrator, Civil Service (Medical): No    Lack of Transportation (Non-Medical): No  Physical Activity: Sufficiently Active (04/16/2021)   Exercise Vital Sign    Days of Exercise per Week: 7 days    Minutes of Exercise per Session: 30 min  Stress: No Stress Concern Present (04/16/2021)   [employer] of Occupational Health - Occupational Stress Questionnaire    Feeling of Stress : Not at all  Social Connections: Socially Integrated (04/22/2022)   Social Connection and Isolation Panel [NHANES]    Frequency of Communication with Friends and Family: More than three times a week    Frequency of Social Gatherings with Friends and Family: More than three times a week    Attends Religious Services: More than 4 times per year    Active Member of [employer] or Organizations: Yes    Attends Engineer, structural: More than 4 times per year    Marital Status: Married    Tobacco Counseling Counseling given: Not Answered   Clinical Intake:  Pre-visit preparation completed: Yes  Pain : No/denies pain (has occasional knee pain recently-seen by Ortho)     BMI - recorded: 20.6 Nutritional Status: BMI of 19-24  Normal Nutritional Risks: None Diabetes: No  How often do you need to have someone help you when you read instructions, pamphlets, or other written  materials from your doctor or pharmacy?: 1 - Never  Diabetic?No  Interpreter Needed?: No  Information entered by :: Thomasenia Sales LPN   Activities of Daily Living    04/22/2022    8:42 AM  In your present state of health, do you have any difficulty performing the following activities:  Hearing? 1  Comment bilateral hearing aids  Vision? 0  Difficulty concentrating or making decisions? 0  Walking or climbing stairs? 0  Dressing or bathing? 0  Doing errands, shopping? 0  Preparing Food and eating ? N  Using the Toilet? N  In the past six months, have you accidently leaked urine? N  Do you have problems with loss of bowel control? N  Managing your Medications? N  Managing your Finances? N  Housekeeping or managing your Housekeeping? N    Patient Care Team: Wanda Plump, MD as PCP - General (Internal Medicine) Larence Penning, OD as Referring Physician (Optometry) Jodi Geralds, MD as Consulting Physician (Orthopedic Surgery) Mikki Santee, MD (Inactive) as Consulting Physician (Allergy and Immunology) Windy Carina, PA-C (Inactive) as Physician Assistant (Rheumatology)  Indicate any recent Medical Services you may have received from other than [facility] providers in the past year (date may be approximate).     Assessment:   This is a routine wellness examination for Vivian.  Hearing/Vision screen Hearing Screening - Comments:: Bilateral hearing aids Vision Screening - Comments:: Last eye exam-04/2021-[facility]  Dietary issues and exercise activities discussed: Current Exercise Habits: Home exercise routine, Type of exercise: walking (walks the dog), Intensity: Mild, Exercise limited by: orthopedic condition(s) (knee pain)   Goals Addressed             This Visit's Progress    Patient Stated   Not on track    Increase exercise/walking       Depression Screen    04/22/2022    8:42 AM 12/27/2021    8:16 AM 04/16/2021    9:58 AM 12/05/2020    8:31 AM 12/17/2019    9:41  AM 12/15/2018    2:37 PM 12/12/2017    2:36  PM  PHQ 2/9 Scores  PHQ - 2 Score 0 0 0 2 0 0 0  PHQ- 9 Score    5       Fall Risk    04/22/2022    8:39 AM 12/27/2021    8:16 AM 04/16/2021    9:55 AM 12/05/2020    8:05 AM 12/17/2019    9:40 AM  Fall Risk   Falls in the past year? 0 0 0 0 0  Number falls in past yr: 0 0 0 0 0  Injury with Fall? 0 0 0 0 0  Follow up Falls prevention discussed Falls evaluation completed Falls prevention discussed  Education provided;Falls prevention discussed    FALL RISK PREVENTION PERTAINING TO THE HOME:  Any stairs in or around the home? Yes  If so, are there any without handrails? No  Home free of loose throw rugs in walkways, pet beds, electrical cords, etc? Yes  Adequate lighting in your home to reduce risk of falls? Yes   ASSISTIVE DEVICES UTILIZED TO PREVENT FALLS:  Life alert? No  Use of a cane, walker or w/c? No  Grab bars in the bathroom? Yes  Shower chair or bench in shower? Yes  Elevated toilet seat or a handicapped toilet? No   TIMED UP AND GO:  Was the test performed? No . Phone visit  Cognitive Function:Normal cognitive status assessed by this Nurse Health Advisor. No abnormalities found.      11/25/2016    2:37 PM  MMSE - Mini Mental State Exam  Orientation to time 5  Orientation to Place 5  Registration 3  Attention/ Calculation 5  Recall 3  Language- name 2 objects 2  Language- repeat 1  Language- follow 3 step command 3  Language- read & follow direction 1  Write a sentence 1  Copy design 1  Total score 30        Immunizations Immunization History  Administered Date(s) Administered   Influenza Inj Mdck Quad Pf 07/13/2019, 07/14/2020, 07/17/2021   Influenza, Quadrivalent, Recombinant, Inj, Pf 08/15/2016, 08/05/2017, 07/10/2018   Moderna Covid-19 Vaccine Bivalent Booster 3668yrs & up 08/17/2021   Moderna Sars-Covid-2 Vaccination 12/05/2019, 01/02/2020, 08/22/2020, 02/19/2021   Pneumococcal Conjugate-13 05/13/2019    Pneumococcal Polysaccharide-23 11/16/2019   Td 02/04/2009    TDAP status: Due, Education has been provided regarding the importance of this vaccine. Advised may receive this vaccine at local pharmacy or Health Dept. Aware to provide a copy of the vaccination record if obtained from local pharmacy or Health Dept. Verbalized acceptance and understanding.  Flu Vaccine status: Up to date  Pneumococcal vaccine status: Up to date  Covid-19 vaccine status: Completed vaccines  Qualifies for Shingles Vaccine? Yes   Zostavax completed No   Shingrix Completed?: No.    Education has been provided regarding the importance of this vaccine. Patient has been advised to call insurance company to determine out of pocket expense if they have not yet received this vaccine. Advised may also receive vaccine at local pharmacy or Health Dept. Verbalized acceptance and understanding.  Screening Tests Health Maintenance  Topic Date Due   Zoster Vaccines- Shingrix (1 of 2) Never done   TETANUS/TDAP  02/05/2019   MAMMOGRAM  02/22/2023   Pneumonia Vaccine 3765+ Years old  Completed   DEXA SCAN  Completed   COVID-19 Vaccine  Completed   Hepatitis C Screening  Completed   HPV VACCINES  Aged Out   COLONOSCOPY (Pts 45-752yrs Insurance coverage will need to be  confirmed)  Discontinued    Health Maintenance  Health Maintenance Due  Topic Date Due   Zoster Vaccines- Shingrix (1 of 2) Never done   TETANUS/TDAP  02/05/2019    Colorectal cancer screening: No longer required.   Mammogram status: Completed bilateral 02/21/2022. Repeat every year  Bone Density status: Completed 01/03/2022. Results reflect: Bone density results: OSTEOPENIA. Repeat every 2 years.  Lung Cancer Screening: (Low Dose CT Chest recommended if Age 26-80 years, 30 pack-year currently smoking OR have quit w/in 15years.) does not qualify.     Additional Screening:  Hepatitis C Screening: Completed 08/10/2015  Vision Screening:  Recommended annual ophthalmology exams for early detection of glaucoma and other disorders of the eye. Is the patient up to date with their annual eye exam?  Yes  Who is the provider or what is the name of the office in which the patient attends annual eye exams? [facility]   Dental Screening: Recommended annual dental exams for proper oral hygiene  Community Resource Referral / Chronic Care Management: CRR required this visit?  No   CCM required this visit?  No      Plan:     I have personally reviewed and noted the following in the patient's chart:   Medical and social history Use of alcohol, tobacco or illicit drugs  Current medications and supplements including opioid prescriptions.  Functional ability and status Nutritional status Physical activity Advanced directives List of other physicians Hospitalizations, surgeries, and ER visits in previous 12 months Vitals Screenings to include cognitive, depression, and falls Referrals and appointments  In addition, I have reviewed and discussed with patient certain preventive protocols, quality metrics, and best practice recommendations. A written personalized care plan for preventive services as well as general preventive health recommendations were provided to patient.   Due to this being a telephonic visit, the after visit summary with patients personalized plan was offered to patient via mail or my-chart. Patient would like to access on my-chart.   Roanna Raider, LPN   [id number]  Nurse Health Advisor  Nurse Notes: None   I have reviewed and agree with Health Coaches documentation.  Willow Ora, MD

## 2022-04-22 NOTE — Patient Instructions (Signed)
Linda Jefferson , Thank you for taking time to complete your Medicare Wellness Visit. I appreciate your ongoing commitment to your health goals. Please review the following plan we discussed and let me know if I can assist you in the future.   Screening recommendations/referrals: Colonoscopy: No longer required Mammogram: Completed 02/21/2022-Due 02/22/2023 Bone Density: Completed 01/03/2022-Due 01/04/2024 Recommended yearly ophthalmology/optometry visit for glaucoma screening and checkup Recommended yearly dental visit for hygiene and checkup  Vaccinations: Influenza vaccine: Up to date Pneumococcal vaccine: Up to date Tdap vaccine: Due-May obtain vaccine at your local pharmacy. Shingles vaccine: Declined   Covid-19:Up to date  Advanced directives: Copy in chart  Conditions/risks identified: See problem list  Next appointment: Follow up in one year for your annual wellness visit    Preventive Care 65 Years and Older, Female Preventive care refers to lifestyle choices and visits with your health care provider that can promote health and wellness. What does preventive care include? A yearly physical exam. This is also called an annual well check. Dental exams once or twice a year. Routine eye exams. Ask your health care provider how often you should have your eyes checked. Personal lifestyle choices, including: Daily care of your teeth and gums. Regular physical activity. Eating a healthy diet. Avoiding tobacco and drug use. Limiting alcohol use. Practicing safe sex. Taking low-dose aspirin every day. Taking vitamin and mineral supplements as recommended by your health care provider. What happens during an annual well check? The services and screenings done by your health care provider during your annual well check will depend on your age, overall health, lifestyle risk factors, and family history of disease. Counseling  Your health care provider may ask you questions about  your: Alcohol use. Tobacco use. Drug use. Emotional well-being. Home and relationship well-being. Sexual activity. Eating habits. History of falls. Memory and ability to understand (cognition). Work and work Astronomer. Reproductive health. Screening  You may have the following tests or measurements: Height, weight, and BMI. Blood pressure. Lipid and cholesterol levels. These may be checked every 5 years, or more frequently if you are over 17 years old. Skin check. Lung cancer screening. You may have this screening every year starting at age 24 if you have a 30-pack-year history of smoking and currently smoke or have quit within the past 15 years. Fecal occult blood test (FOBT) of the stool. You may have this test every year starting at age 17. Flexible sigmoidoscopy or colonoscopy. You may have a sigmoidoscopy every 5 years or a colonoscopy every 10 years starting at age 27. Hepatitis C blood test. Hepatitis B blood test. Sexually transmitted disease (STD) testing. Diabetes screening. This is done by checking your blood sugar (glucose) after you have not eaten for a while (fasting). You may have this done every 1-3 years. Bone density scan. This is done to screen for osteoporosis. You may have this done starting at age 5. Mammogram. This may be done every 1-2 years. Talk to your health care provider about how often you should have regular mammograms. Talk with your health care provider about your test results, treatment options, and if necessary, the need for more tests. Vaccines  Your health care provider may recommend certain vaccines, such as: Influenza vaccine. This is recommended every year. Tetanus, diphtheria, and acellular pertussis (Tdap, Td) vaccine. You may need a Td booster every 10 years. Zoster vaccine. You may need this after age 10. Pneumococcal 13-valent conjugate (PCV13) vaccine. One dose is recommended after age 79. Pneumococcal polysaccharide (  PPSV23) vaccine.  One dose is recommended after age 59. Talk to your health care provider about which screenings and vaccines you need and how often you need them. This information is not intended to replace advice given to you by your health care provider. Make sure you discuss any questions you have with your health care provider. Document Released: 10/20/2015 Document Revised: 06/12/2016 Document Reviewed: 07/25/2015 Elsevier Interactive Patient Education  2017 [employer] Prevention in the Home Falls can cause injuries. They can happen to people of all ages. There are many things you can do to make your home safe and to help prevent falls. What can I do on the outside of my home? Regularly fix the edges of walkways and driveways and fix any cracks. Remove anything that might make you trip as you walk through a door, such as a raised step or threshold. Trim any bushes or trees on the path to your home. Use bright outdoor lighting. Clear any walking paths of anything that might make someone trip, such as rocks or tools. Regularly check to see if handrails are loose or broken. Make sure that both sides of any steps have handrails. Any raised decks and porches should have guardrails on the edges. Have any leaves, snow, or ice cleared regularly. Use sand or salt on walking paths during winter. Clean up any spills in your garage right away. This includes oil or grease spills. What can I do in the bathroom? Use night lights. Install grab bars by the toilet and in the tub and shower. Do not use towel bars as grab bars. Use non-skid mats or decals in the tub or shower. If you need to sit down in the shower, use a plastic, non-slip stool. Keep the floor dry. Clean up any water that spills on the floor as soon as it happens. Remove soap buildup in the tub or shower regularly. Attach bath mats securely with double-sided non-slip rug tape. Do not have throw rugs and other things on the floor that can make  you trip. What can I do in the bedroom? Use night lights. Make sure that you have a light by your bed that is easy to reach. Do not use any sheets or blankets that are too big for your bed. They should not hang down onto the floor. Have a firm chair that has side arms. You can use this for support while you get dressed. Do not have throw rugs and other things on the floor that can make you trip. What can I do in the kitchen? Clean up any spills right away. Avoid walking on wet floors. Keep items that you use a lot in easy-to-reach places. If you need to reach something above you, use a strong step stool that has a grab bar. Keep electrical cords out of the way. Do not use floor polish or wax that makes floors slippery. If you must use wax, use non-skid floor wax. Do not have throw rugs and other things on the floor that can make you trip. What can I do with my stairs? Do not leave any items on the stairs. Make sure that there are handrails on both sides of the stairs and use them. Fix handrails that are broken or loose. Make sure that handrails are as long as the stairways. Check any carpeting to make sure that it is firmly attached to the stairs. Fix any carpet that is loose or worn. Avoid having throw rugs at the top or  bottom of the stairs. If you do have throw rugs, attach them to the floor with carpet tape. Make sure that you have a light switch at the top of the stairs and the bottom of the stairs. If you do not have them, ask someone to add them for you. What else can I do to help prevent falls? Wear shoes that: Do not have high heels. Have rubber bottoms. Are comfortable and fit you well. Are closed at the toe. Do not wear sandals. If you use a stepladder: Make sure that it is fully opened. Do not climb a closed stepladder. Make sure that both sides of the stepladder are locked into place. Ask someone to hold it for you, if possible. Clearly mark and make sure that you can  see: Any grab bars or handrails. First and last steps. Where the edge of each step is. Use tools that help you move around (mobility aids) if they are needed. These include: Canes. Walkers. Scooters. Crutches. Turn on the lights when you go into a dark area. Replace any light bulbs as soon as they burn out. Set up your furniture so you have a clear path. Avoid moving your furniture around. If any of your floors are uneven, fix them. If there are any pets around you, be aware of where they are. Review your medicines with your doctor. Some medicines can make you feel dizzy. This can increase your chance of falling. Ask your doctor what other things that you can do to help prevent falls. This information is not intended to replace advice given to you by your health care provider. Make sure you discuss any questions you have with your health care provider. Document Released: 07/20/2009 Document Revised: 02/29/2016 Document Reviewed: 10/28/2014 Elsevier Interactive Patient Education  2017 [employer].

## 2022-04-24 DIAGNOSIS — M6281 Muscle weakness (generalized): Secondary | ICD-10-CM | POA: Diagnosis not present

## 2022-04-24 DIAGNOSIS — Z7409 Other reduced mobility: Secondary | ICD-10-CM | POA: Diagnosis not present

## 2022-04-24 DIAGNOSIS — M25561 Pain in right knee: Secondary | ICD-10-CM | POA: Diagnosis not present

## 2022-04-25 ENCOUNTER — Telehealth: Payer: Self-pay | Admitting: Internal Medicine

## 2022-04-25 NOTE — Telephone Encounter (Signed)
Requesting: alprazolam 0.5mg   Contract: 12/05/20 UDS: 12/27/21 Last Visit: 12/27/21 Next Visit: None Last Refill: 12/11/21 #120 and 2RF  Please Advise

## 2022-04-25 NOTE — Telephone Encounter (Signed)
PDMP okay, Rx sent 

## 2022-04-26 DIAGNOSIS — F411 Generalized anxiety disorder: Secondary | ICD-10-CM | POA: Diagnosis not present

## 2022-05-01 DIAGNOSIS — M25561 Pain in right knee: Secondary | ICD-10-CM | POA: Diagnosis not present

## 2022-05-01 DIAGNOSIS — M6281 Muscle weakness (generalized): Secondary | ICD-10-CM | POA: Diagnosis not present

## 2022-05-01 DIAGNOSIS — Z7409 Other reduced mobility: Secondary | ICD-10-CM | POA: Diagnosis not present

## 2022-05-06 DIAGNOSIS — F411 Generalized anxiety disorder: Secondary | ICD-10-CM | POA: Diagnosis not present

## 2022-05-09 DIAGNOSIS — M25561 Pain in right knee: Secondary | ICD-10-CM | POA: Diagnosis not present

## 2022-05-09 DIAGNOSIS — M6281 Muscle weakness (generalized): Secondary | ICD-10-CM | POA: Diagnosis not present

## 2022-05-09 DIAGNOSIS — Z7409 Other reduced mobility: Secondary | ICD-10-CM | POA: Diagnosis not present

## 2022-05-13 DIAGNOSIS — Z7409 Other reduced mobility: Secondary | ICD-10-CM | POA: Diagnosis not present

## 2022-05-13 DIAGNOSIS — M6281 Muscle weakness (generalized): Secondary | ICD-10-CM | POA: Diagnosis not present

## 2022-05-13 DIAGNOSIS — M25561 Pain in right knee: Secondary | ICD-10-CM | POA: Diagnosis not present

## 2022-05-17 ENCOUNTER — Other Ambulatory Visit: Payer: Self-pay | Admitting: Internal Medicine

## 2022-05-20 ENCOUNTER — Telehealth: Payer: Self-pay | Admitting: Internal Medicine

## 2022-05-20 DIAGNOSIS — M6281 Muscle weakness (generalized): Secondary | ICD-10-CM | POA: Diagnosis not present

## 2022-05-20 DIAGNOSIS — Z7409 Other reduced mobility: Secondary | ICD-10-CM | POA: Diagnosis not present

## 2022-05-20 DIAGNOSIS — M25561 Pain in right knee: Secondary | ICD-10-CM | POA: Diagnosis not present

## 2022-05-20 DIAGNOSIS — F411 Generalized anxiety disorder: Secondary | ICD-10-CM | POA: Diagnosis not present

## 2022-05-20 NOTE — Telephone Encounter (Signed)
Patient stopped by the office to drop off an envelope, when asked what it was pt stated it was a request for a new medication. Advised patient that for new medications, an appointment could possibly be needed. Patient stated she has seen Dr. Drue Novel for over 20 years and just to give the envelope to the CMA. Envelope placed on PCP box.

## 2022-05-20 NOTE — Telephone Encounter (Signed)
Pt dropped off MRI report. Placed in PCP blue folder for review.

## 2022-05-21 ENCOUNTER — Ambulatory Visit: Payer: Medicare PPO | Admitting: Internal Medicine

## 2022-05-21 ENCOUNTER — Encounter: Payer: Self-pay | Admitting: Internal Medicine

## 2022-05-21 VITALS — BP 122/72 | HR 65 | Temp 98.3°F | Resp 16 | Ht 64.0 in | Wt 122.0 lb

## 2022-05-21 DIAGNOSIS — M1120 Other chondrocalcinosis, unspecified site: Secondary | ICD-10-CM

## 2022-05-21 MED ORDER — DICLOFENAC SODIUM 50 MG PO TBEC: 50.0000 mg | DELAYED_RELEASE_TABLET | Freq: Two times a day (BID) | ORAL | 1 refills | Status: DC | PRN

## 2022-05-21 NOTE — Patient Instructions (Addendum)
Take Voltaren twice daily as needed. Minimize its use by also taking Tylenol and ice your joints.  Always take Voltaren with food because may cause gastritis and ulcers.  If you notice nausea, stomach pain, change in the color of stools --->  Stop the medicine and let us know   Recommend to proceed with the following vaccines at your pharmacy:  Shingrix (shingles) Tdap (tetanus) Covid booster (bivalent)  Flu shot this fall

## 2022-05-21 NOTE — Progress Notes (Signed)
Subjective:    Patient ID: Linda Jefferson, female    DOB: [birth year], 76 y.o.   MRN: [id number]  DOS:  05/21/2022 Type of visit - description: Acute  The patient had a fall June 7. Went to Ortho with right knee pain, MRI showing meniscal tear.  Also, 3 days ago wake up with pain at the right arm, thumb, wrist. She thinks is pseudogout and requests aVoltaren prescription. She did try the topical Voltaren and it did not help.  She denies neck pain. No puffiness-warm-redness on any of the upper extremity joints.  Review of Systems See above   Past Medical History:  Diagnosis Date   Anxiety    Carotid artery disease (HCC)    per u/s 01/2009- repeated u/s 7/11 "mild dz, no stenosis"   Colitis    Dyslipidemia    Food allergy    mushrooms   Gallstones    Hyperlipidemia    Hypertension    IBS (irritable bowel syndrome)    LBP (low back pain) 3/11   after a local injection, f/u [facility]   Normal cardiac stress test 04/2009   Osteopenia    Seasonal allergies    Syncope 01/2009   holter atrail tachycardia   Wears hearing aid     Past Surgical History:  Procedure Laterality Date   BUNIONECTOMY Bilateral 03/2013   CARPAL TUNNEL RELEASE Right    CATARACT EXTRACTION Bilateral 2020   Dr.Beavis   CHOLECYSTECTOMY     TOTAL SHOULDER ARTHROPLASTY Left 04/2016   TRIGGER FINGER RELEASE  03/31/2012   Procedure: RELEASE TRIGGER FINGER/A-1 PULLEY;  Surgeon: Wyn Forster., MD;  Location: [facility];  Service: Orthopedics;  Laterality: Right;  right index    Current Outpatient Medications  Medication Instructions   amLODipine-benazepril (LOTREL) 5-10 MG capsule TAKE 1 CAPSULE BY MOUTH EVERY DAY   Artificial Tear Ointment (DRY EYES OP) 2 drops, Both Eyes, As needed   azelastine (ASTELIN) 0.1 % nasal spray 2 sprays, Each Nare, 2 times daily   clonazePAM (KLONOPIN) 0.5 mg, Oral, 4 times daily PRN   diclofenac (VOLTAREN) 50 MG EC tablet 1 tablet, Oral, 2 times  daily   diclofenac Sodium (VOLTAREN) 2 g, Topical, 4 times daily PRN   diphenoxylate-atropine (LOMOTIL) 2.5-0.025 MG tablet 1 tablet, Oral, Daily   ESTRACE VAGINAL 0.5 g, Vaginal, Daily, Apply to affected area.   finasteride (PROSCAR) 5 mg, Oral, Daily   hydrocortisone (ANUSOL-HC) 2.5 % rectal cream 1 Application, Rectal, 2 times daily, For 7 days   lidocaine (XYLOCAINE) 2 % jelly 1 Application, Topical, As needed   Multiple Vitamin (MULTIVITAMIN) capsule 1 capsule, Oral, Daily,     raloxifene (EVISTA) 60 MG tablet TAKE 1 TABLET BY MOUTH EVERY DAY   simvastatin (ZOCOR) 20 MG tablet TAKE 1 TABLET BY MOUTH EVERYDAY AT BEDTIME       Objective:   Physical Exam BP 122/72   Pulse 65   Temp 98.3 F (36.8 C) (Oral)   Resp 16   Ht 5\' 4"  (1.626 m)   Wt 122 lb (55.3 kg)   SpO2 98%   BMI 20.94 kg/m  General:   Well developed, NAD, BMI noted. HEENT:  Normocephalic . Face symmetric, atraumatic Upper extremities: Hands and wrists: No synovitis Elbows full range of motion, right side slightly TTP but without any redness-swelling. Shoulder symmetric, range of motion satisfactory. Lower extremities: no pretibial edema bilaterally.  Using knee brace on the right Skin: Not pale. Not  jaundice Neurologic:  alert & oriented X3.  Speech normal, gait slightly limited by right knee pain Psych--  Cognition and judgment appear intact.  Cooperative with normal attention span and concentration.  Behavior appropriate. No anxious or depressed appearing.      Assessment   Assessment HTN Hyperlipidemia Osteopenia  Per dexa 2011,  2014 (Tt score  - 1.7), and 11/2016. On Evista d/t FH breast ca Anxiety, insomnia -- on clonazepam qid  Seasonal (and food?) allergies Dermatology: - Alopecia Female pattern, lichen planopilaris; saw derm 04-2017   -Lichen planus DX gynecology 03/2020 [facility], hearing aids MSK: --Back DJD and spinal stenosis per MRI 06-2017 H/o Syncope, 2010:   --Nl  stress test, Holter:  Atrial tachycardia --Carotid ultrasound 2010,2011 , 2014 no significant stenosis FH: +FH CAD, mother age 72 + FH Breast ca- mother, sister x2, aunt --- on Evista  GI: -IBS, saw GI 08/03/2018 -Admitted colitis 10-2016   PLAN: Pseudogout:   she thinks her recent acute pain at the wrist, thumb and right arm related to previous dx of pseudogout however  did not have synovitis and is spontaneously better in the last couple of days. Unclear to me if this was truly pseudogout.  She request Voltaren to take as needed, I think that is reasonable, extensive discussion about GI precautions and I also encouraged the use of ice and Tylenol.  See AVS. Requested referral to rheumatology.  Will do. R knee injury: Had a fall June 7, saw Ortho, MRI showed meniscal tear.  Further advised per Ortho

## 2022-05-21 NOTE — Assessment & Plan Note (Signed)
Pseudogout:   she thinks her recent acute pain at the wrist, thumb and right arm related to previous dx of pseudogout however  did not have synovitis and is spontaneously better in the last couple of days. Unclear to me if this was truly pseudogout.  She request Voltaren to take as needed, I think that is reasonable, extensive discussion about GI precautions and I also encouraged the use of ice and Tylenol.  See AVS. Requested referral to rheumatology.  Will do. R knee injury: Had a fall June 7, saw Ortho, MRI showed meniscal tear.  Further advised per Ortho

## 2022-05-23 DIAGNOSIS — M25561 Pain in right knee: Secondary | ICD-10-CM | POA: Diagnosis not present

## 2022-05-23 DIAGNOSIS — Z7409 Other reduced mobility: Secondary | ICD-10-CM | POA: Diagnosis not present

## 2022-05-23 DIAGNOSIS — M6281 Muscle weakness (generalized): Secondary | ICD-10-CM | POA: Diagnosis not present

## 2022-05-27 DIAGNOSIS — M25561 Pain in right knee: Secondary | ICD-10-CM | POA: Diagnosis not present

## 2022-05-27 DIAGNOSIS — Z7409 Other reduced mobility: Secondary | ICD-10-CM | POA: Diagnosis not present

## 2022-05-27 DIAGNOSIS — M6281 Muscle weakness (generalized): Secondary | ICD-10-CM | POA: Diagnosis not present

## 2022-05-30 DIAGNOSIS — Z7409 Other reduced mobility: Secondary | ICD-10-CM | POA: Diagnosis not present

## 2022-05-30 DIAGNOSIS — M6281 Muscle weakness (generalized): Secondary | ICD-10-CM | POA: Diagnosis not present

## 2022-05-30 DIAGNOSIS — M25561 Pain in right knee: Secondary | ICD-10-CM | POA: Diagnosis not present

## 2022-06-03 DIAGNOSIS — F411 Generalized anxiety disorder: Secondary | ICD-10-CM | POA: Diagnosis not present

## 2022-06-04 DIAGNOSIS — M25561 Pain in right knee: Secondary | ICD-10-CM | POA: Diagnosis not present

## 2022-06-04 DIAGNOSIS — M6281 Muscle weakness (generalized): Secondary | ICD-10-CM | POA: Diagnosis not present

## 2022-06-04 DIAGNOSIS — Z7409 Other reduced mobility: Secondary | ICD-10-CM | POA: Diagnosis not present

## 2022-06-06 DIAGNOSIS — Z7409 Other reduced mobility: Secondary | ICD-10-CM | POA: Diagnosis not present

## 2022-06-06 DIAGNOSIS — M6281 Muscle weakness (generalized): Secondary | ICD-10-CM | POA: Diagnosis not present

## 2022-06-06 DIAGNOSIS — M25561 Pain in right knee: Secondary | ICD-10-CM | POA: Diagnosis not present

## 2022-06-11 DIAGNOSIS — M25561 Pain in right knee: Secondary | ICD-10-CM | POA: Diagnosis not present

## 2022-07-12 DIAGNOSIS — M1120 Other chondrocalcinosis, unspecified site: Secondary | ICD-10-CM | POA: Diagnosis not present

## 2022-07-16 ENCOUNTER — Encounter: Payer: Self-pay | Admitting: Internal Medicine

## 2022-07-16 ENCOUNTER — Ambulatory Visit (INDEPENDENT_AMBULATORY_CARE_PROVIDER_SITE_OTHER): Payer: Medicare PPO

## 2022-07-16 DIAGNOSIS — Z23 Encounter for immunization: Secondary | ICD-10-CM | POA: Diagnosis not present

## 2022-07-17 ENCOUNTER — Encounter: Payer: Self-pay | Admitting: Obstetrics & Gynecology

## 2022-07-17 ENCOUNTER — Ambulatory Visit: Payer: Medicare PPO | Admitting: Obstetrics & Gynecology

## 2022-07-17 VITALS — BP 125/66 | HR 78 | Wt 122.0 lb

## 2022-07-17 DIAGNOSIS — N904 Leukoplakia of vulva: Secondary | ICD-10-CM | POA: Diagnosis not present

## 2022-07-17 NOTE — Progress Notes (Signed)
History:  76 y.o. [id number] here today for lichen sclerosus. Pt reports no itching or other sx. She is    The following portions of the patient's history were reviewed and updated as appropriate: allergies, current medications, past family history, past medical history, past social history, past surgical history and problem list.  Review of Systems:  Pertinent items are noted in HPI.    Objective:  Physical Exam Blood pressure 125/66, pulse 78, weight 122 lb (55.3 kg). CONSTITUTIONAL: Well-developed, well-nourished female in no acute distress.  HENT:  Normocephalic, atraumatic EYES: Conjunctivae and EOM are normal. No scleral icterus.  NECK: Normal range of motion SKIN: Skin is warm and dry. No rash noted. Not diaphoretic.No pallor. [place]: Alert and oriented to person, place, and time. Normal coordination.   Abd: Soft, nontender and nondistended Pelvic: Normal appearing external genitalia; normal appearing vaginal mucosa and cervix.  Normal discharge.  Small uterus, no other palpable masses, no uterine or adnexal tenderness   Assessment & Plan:  Lichen sclerosus/Atrophic vaginitis   Keep topical EES cream to urethra. Pt told that she could use this on the ext genialia as well.  Coconut oil to ext genitalia.    F/u in 6 months or sooner prn

## 2022-07-24 ENCOUNTER — Encounter: Payer: Self-pay | Admitting: Obstetrics & Gynecology

## 2022-07-29 DIAGNOSIS — F411 Generalized anxiety disorder: Secondary | ICD-10-CM | POA: Diagnosis not present

## 2022-08-01 ENCOUNTER — Encounter: Payer: Self-pay | Admitting: Internal Medicine

## 2022-08-13 DIAGNOSIS — M545 Low back pain, unspecified: Secondary | ICD-10-CM | POA: Diagnosis not present

## 2022-08-26 ENCOUNTER — Encounter: Payer: Self-pay | Admitting: Internal Medicine

## 2022-08-26 DIAGNOSIS — F411 Generalized anxiety disorder: Secondary | ICD-10-CM | POA: Diagnosis not present

## 2022-08-26 MED ORDER — CLONAZEPAM 0.5 MG PO TABS: 0.5000 mg | ORAL_TABLET | Freq: Four times a day (QID) | ORAL | 2 refills | Status: DC | PRN

## 2022-08-26 NOTE — Telephone Encounter (Signed)
PDMP okay, Rx sent 

## 2022-08-26 NOTE — Telephone Encounter (Signed)
Requesting: clonazepam 0.5mg   Contract:12/05/20 UDS:12/27/21 Last Visit: 05/21/22 Next Visit: None Last Refill: 04/25/22 #120 and 2RF Pt sig: 1 tab qid prn  Please Advise

## 2022-09-09 DIAGNOSIS — F411 Generalized anxiety disorder: Secondary | ICD-10-CM | POA: Diagnosis not present

## 2022-09-23 DIAGNOSIS — F411 Generalized anxiety disorder: Secondary | ICD-10-CM | POA: Diagnosis not present

## 2022-10-08 DIAGNOSIS — F411 Generalized anxiety disorder: Secondary | ICD-10-CM | POA: Diagnosis not present

## 2022-10-14 ENCOUNTER — Encounter: Payer: Self-pay | Admitting: Internal Medicine

## 2022-10-14 MED ORDER — DICLOFENAC SODIUM 1 % EX GEL: 2.0000 g | Freq: Four times a day (QID) | CUTANEOUS | 1 refills | Status: AC | PRN

## 2022-10-21 DIAGNOSIS — F411 Generalized anxiety disorder: Secondary | ICD-10-CM | POA: Diagnosis not present

## 2022-10-30 ENCOUNTER — Encounter: Payer: Self-pay | Admitting: Gastroenterology

## 2022-11-05 DIAGNOSIS — L661 Lichen planopilaris: Secondary | ICD-10-CM | POA: Diagnosis not present

## 2022-11-05 DIAGNOSIS — L658 Other specified nonscarring hair loss: Secondary | ICD-10-CM | POA: Diagnosis not present

## 2022-11-08 ENCOUNTER — Encounter: Payer: Self-pay | Admitting: Internal Medicine

## 2022-12-02 DIAGNOSIS — F411 Generalized anxiety disorder: Secondary | ICD-10-CM | POA: Diagnosis not present

## 2022-12-16 DIAGNOSIS — F411 Generalized anxiety disorder: Secondary | ICD-10-CM | POA: Diagnosis not present

## 2022-12-17 ENCOUNTER — Other Ambulatory Visit: Payer: Self-pay | Admitting: Internal Medicine

## 2022-12-30 DIAGNOSIS — F411 Generalized anxiety disorder: Secondary | ICD-10-CM | POA: Diagnosis not present

## 2023-01-01 ENCOUNTER — Encounter: Payer: Self-pay | Admitting: Internal Medicine

## 2023-01-01 ENCOUNTER — Ambulatory Visit (INDEPENDENT_AMBULATORY_CARE_PROVIDER_SITE_OTHER): Payer: Medicare PPO | Admitting: Internal Medicine

## 2023-01-01 VITALS — BP 118/72 | HR 79 | Temp 98.5°F | Resp 16 | Ht 64.0 in | Wt 126.4 lb

## 2023-01-01 DIAGNOSIS — F419 Anxiety disorder, unspecified: Secondary | ICD-10-CM | POA: Diagnosis not present

## 2023-01-01 DIAGNOSIS — Z0000 Encounter for general adult medical examination without abnormal findings: Secondary | ICD-10-CM

## 2023-01-01 DIAGNOSIS — I10 Essential (primary) hypertension: Secondary | ICD-10-CM

## 2023-01-01 DIAGNOSIS — E7849 Other hyperlipidemia: Secondary | ICD-10-CM

## 2023-01-01 DIAGNOSIS — Z79899 Other long term (current) drug therapy: Secondary | ICD-10-CM

## 2023-01-01 LAB — CBC WITH DIFFERENTIAL/PLATELET
Basophils Absolute: 0 10*3/uL (ref 0.0–0.1)
Basophils Relative: 1.1 % (ref 0.0–3.0)
Eosinophils Absolute: 0.1 10*3/uL (ref 0.0–0.7)
Eosinophils Relative: 3.1 % (ref 0.0–5.0)
HCT: 38.3 % (ref 36.0–46.0)
Hemoglobin: 12.7 g/dL (ref 12.0–15.0)
Lymphocytes Relative: 26.8 % (ref 12.0–46.0)
Lymphs Abs: 1.2 10*3/uL (ref 0.7–4.0)
MCHC: 33.1 g/dL (ref 30.0–36.0)
MCV: 91.3 fl (ref 78.0–100.0)
Monocytes Absolute: 0.3 10*3/uL (ref 0.1–1.0)
Monocytes Relative: 7.5 % (ref 3.0–12.0)
Neutro Abs: 2.7 10*3/uL (ref 1.4–7.7)
Neutrophils Relative %: 61.5 % (ref 43.0–77.0)
Platelets: 323 10*3/uL (ref 150.0–400.0)
RBC: 4.19 Mil/uL (ref 3.87–5.11)
RDW: 13 % (ref 11.5–15.5)
WBC: 4.4 10*3/uL (ref 4.0–10.5)

## 2023-01-01 LAB — COMPREHENSIVE METABOLIC PANEL
ALT: 19 U/L (ref 0–35)
AST: 30 U/L (ref 0–37)
Albumin: 4.3 g/dL (ref 3.5–5.2)
Alkaline Phosphatase: 57 U/L (ref 39–117)
BUN: 19 mg/dL (ref 6–23)
CO2: 30 mEq/L (ref 19–32)
Calcium: 9.9 mg/dL (ref 8.4–10.5)
Chloride: 101 mEq/L (ref 96–112)
Creatinine, Ser: 0.73 mg/dL (ref 0.40–1.20)
GFR: 79.08 mL/min (ref >60.00)
Glucose, Bld: 90 mg/dL (ref 70–99)
Potassium: 4.8 mEq/L (ref 3.5–5.1)
Sodium: 136 mEq/L (ref 135–145)
Total Bilirubin: 0.5 mg/dL (ref 0.2–1.2)
Total Protein: 6.7 g/dL (ref 6.0–8.3)

## 2023-01-01 LAB — LIPID PANEL
Cholesterol: 142 mg/dL (ref 0–200)
HDL: 66.8 mg/dL (ref >39.00)
LDL Cholesterol: 57 mg/dL (ref 0–99)
NonHDL: 75.45
Total CHOL/HDL Ratio: 2
Triglycerides: 94 mg/dL (ref 0.0–149.0)
VLDL: 18.8 mg/dL (ref 0.0–40.0)

## 2023-01-01 NOTE — Patient Instructions (Addendum)
Vaccines I recommend: RSV,  Shingrex , Flu shot every fall and a Tdap booster     Check the  blood pressure regularly BP GOAL is between 110/65 and  135/85. If it is consistently higher or lower, let me know      GO TO THE LAB : Get the blood work     [place], [place] back for a physical exam in 1 year

## 2023-01-01 NOTE — Progress Notes (Unsigned)
Subjective:    Patient ID: Linda Jefferson, female    DOB: [birth year], 77 y.o.   MRN: [id number]  DOS:  01/01/2023 Type of visit - description: cpx  Here for CPX In general feeling well. IBS currently silent. Emotionally doing well.   Review of Systems See above   Past Medical History:  Diagnosis Date   Anxiety    Carotid artery disease ([place])    per u/s 01/2009- repeated u/s 7/11 "mild dz, no stenosis"   Colitis    Dyslipidemia    Food allergy    mushrooms   Gallstones    Hyperlipidemia    Hypertension    IBS (irritable bowel syndrome)    LBP (low back pain) 3/11   after a local injection, f/u [facility]   Normal cardiac stress test 04/2009   Osteopenia    Seasonal allergies    Syncope 01/2009   holter atrail tachycardia   Wears hearing aid     Past Surgical History:  Procedure Laterality Date   BUNIONECTOMY Bilateral 03/2013   CARPAL TUNNEL RELEASE Right    CATARACT EXTRACTION Bilateral 2020   Dr.Beavis   CHOLECYSTECTOMY     TOTAL SHOULDER ARTHROPLASTY Left 04/2016   TRIGGER FINGER RELEASE  03/31/2012   Procedure: RELEASE TRIGGER FINGER/A-1 PULLEY;  Surgeon: Cammie Sickle., MD;  Location: [facility];  Service: Orthopedics;  Laterality: Right;  right index    Current Outpatient Medications  Medication Instructions   amLODipine-benazepril (LOTREL) 5-10 MG capsule TAKE 1 CAPSULE BY MOUTH EVERY DAY   Artificial Tear Ointment (DRY EYES OP) 2 drops, Both Eyes, As needed   azelastine (ASTELIN) 0.1 % nasal spray 2 sprays, Each Nare, 2 times daily   clonazePAM (KLONOPIN) 0.5 mg, Oral, 4 times daily PRN   diclofenac (VOLTAREN) 50 mg, Oral, 2 times daily PRN   diclofenac Sodium (VOLTAREN) 2 g, Topical, 4 times daily PRN   diphenoxylate-atropine (LOMOTIL) 2.5-0.025 MG tablet 1 tablet, Oral, Daily   ESTRACE VAGINAL 0.5 g, Vaginal, Daily, Apply to affected area.   finasteride (PROSCAR) 5 mg, Oral, Daily   hydrocortisone (ANUSOL-HC) 2.5 % rectal  cream 1 Application, Rectal, 2 times daily, For 7 days   lidocaine (XYLOCAINE) 2 % jelly 1 Application, Topical, As needed   minoxidil (LONITEN) 1.25 mg, Oral, Daily   Multiple Vitamin (MULTIVITAMIN) capsule 1 capsule, Oral, Daily,     raloxifene (EVISTA) 60 MG tablet TAKE 1 TABLET BY MOUTH EVERY DAY   simvastatin (ZOCOR) 20 MG tablet TAKE 1 TABLET BY MOUTH EVERYDAY AT BEDTIME   VITAMIN D PO 3,000 Units, Oral, Daily       Objective:   Physical Exam BP 118/72   Pulse 79   Temp 98.5 F (36.9 C) (Oral)   Resp 16   Ht 5\' 4"  (1.626 m)   Wt 126 lb 6 oz (57.3 kg)   SpO2 97%   BMI 21.69 kg/m  General: Well developed, NAD, BMI noted Neck: No  thyromegaly  HEENT:  Normocephalic . Face symmetric, atraumatic Lungs:  CTA B Normal respiratory effort, no intercostal retractions, no accessory muscle use. Heart: RRR,  no murmur.  Abdomen:  Not distended, soft, non-tender. No rebound or rigidity.  Palpable nontender aorta. Lower extremities: no pretibial edema bilaterally  Skin: Exposed areas without rash. Not pale. Not jaundice Neurologic:  alert & oriented X3.  Speech normal, gait appropriate for age and unassisted Strength symmetric and appropriate for age.  Psych: Cognition and judgment  appear intact.  Cooperative with normal attention span and concentration.  Behavior appropriate. No anxious or depressed appearing.     Assessment     Assessment HTN Hyperlipidemia Osteopenia  Per dexa 2011,  2014 (Tt score  - 1.7), and 11/2016.  2023: T-score -1.9. On Evista d/t FH breast ca Anxiety, insomnia -- on clonazepam qid  Seasonal (and food?) allergies Dermatology: - Alopecia Female pattern, lichen planopilaris; saw derm 0000000   -Lichen planus DX gynecology 03/2020 [facility], hearing aids MSK: --Back DJD and spinal stenosis per MRI 06-2017 --- pseudogout - sees rheumat. H/o Syncope, 2010:   --Nl  stress test, Holter: Atrial tachycardia --Carotid ultrasound 2010,2011 , 2014 no  significant stenosis FH: +FH CAD, mother age 6 + FH Breast ca- mother, sister x2, aunt --- on Evista  GI: -IBS, saw GI 08/03/2018 -Admitted colitis 10-2016   PLAN: Here for CPX, see separate documentation HTN: Seems well-controlled, check labs Hyperlipidemia: On simvastatin, checking labs Anxiety, insomnia: On clonazepam, takes daily, sees a counselor, well-controlled.  Check UDS. Pseudogout: Per rheumatology. Skin: Sees dermatology Back pain: Last visit with orthopedics 08-2022. RTC 1 year, sooner if needed   - Td 5-10  -PNM 13: 05-2019; PNM 23: 11-2019 (had a local reaction) - COVID VAX- had a reaction, c\declines  Rec: RSV,  Shingrex , Flu shot every fall and a Tdap booster     -Female care: Sees gyn   - Strong FH breast ca, pt reports previous  genetic testing: negative; MMG schedule for 02-2023; declines clinical exam, does self checks -Colonoscopy:  04/18/2008, hemorrhoids. Cscope 12-2016 (done for colitis): no plyps.  10-year recall per GI letter -Palpable Ao: [place] neg for AAA 6-10 -diet-exercise: Seems to be doing well.- advance directives : documents on file   -Labs:  CMP FLP CBC    Pseudogout:   she thinks her recent acute pain at the wrist, thumb and right arm related to previous dx of pseudogout however  did not have synovitis and is spontaneously better in the last couple of days. Unclear to me if this was truly pseudogout.  She request Voltaren to take as needed, I think that is reasonable, extensive discussion about GI precautions and I also encouraged the use of ice and Tylenol.  See AVS. Requested referral to rheumatology.  Will do. R knee injury: Had a fall June 7, saw Ortho, MRI showed meniscal tear.  Further advised per Ortho

## 2023-01-02 ENCOUNTER — Encounter: Payer: Self-pay | Admitting: Internal Medicine

## 2023-01-02 NOTE — Assessment & Plan Note (Signed)
Here for CPX, see separate documentation HTN: Seems well-controlled, check labs Hyperlipidemia: On simvastatin, checking labs Anxiety, insomnia: On clonazepam, takes daily, sees a counselor, well-controlled.  Check UDS. Pseudogout: Per rheumatology. Skin: Sees dermatology Back pain: Last visit with orthopedics 08-2022. RTC 1 year, sooner if needed

## 2023-01-02 NOTE — Assessment & Plan Note (Signed)
-   Td 5-10  -PNM 13: 05-2019; PNM 23: 11-2019 (had a local reaction) - COVID VAX- had a reaction, declines  - Rec: RSV,  Shingrex , Flu shot every fall and a Tdap booster    -Female care: Sees gyn   - Strong FH breast ca, pt reports previous  genetic testing: negative; MMG schedule for 02-2023; declines clinical exam, does self checks -Colonoscopy:  04/18/2008, hemorrhoids. Cscope 12-2016 (done for colitis): no plyps.  10-year recall per GI letter -Palpable Ao: [place] neg for AAA 6-10 -diet-exercise: Seems to be doing well.- advance directives : documents on file   -Labs:  CMP FLP CBC

## 2023-01-04 LAB — DRUG TOX MONITOR 1 W/CONF, ORAL FLD
Alprazolam: NEGATIVE ng/mL (ref <0.50)
Amphetamines: NEGATIVE ng/mL (ref <10)
Barbiturates: NEGATIVE ng/mL (ref <10)
Benzodiazepines: POSITIVE ng/mL — AB (ref <0.50)
Buprenorphine: NEGATIVE ng/mL (ref <0.10)
Chlordiazepoxide: NEGATIVE ng/mL (ref <0.50)
Clonazepam: 2.26 ng/mL — ABNORMAL HIGH (ref <0.50)
Cocaine: NEGATIVE ng/mL (ref <5.0)
Diazepam: NEGATIVE ng/mL (ref <0.50)
Fentanyl: NEGATIVE ng/mL (ref <0.10)
Flunitrazepam: NEGATIVE ng/mL (ref <0.50)
Flurazepam: NEGATIVE ng/mL (ref <0.50)
Heroin Metabolite: NEGATIVE ng/mL (ref <1.0)
Lorazepam: NEGATIVE ng/mL (ref <0.50)
MARIJUANA: NEGATIVE ng/mL (ref <2.5)
MDMA: NEGATIVE ng/mL (ref <10)
Meprobamate: NEGATIVE ng/mL (ref <2.5)
Methadone: NEGATIVE ng/mL (ref <5.0)
Midazolam: NEGATIVE ng/mL (ref <0.50)
Nicotine Metabolite: NEGATIVE ng/mL (ref <5.0)
Nordiazepam: NEGATIVE ng/mL (ref <0.50)
Opiates: NEGATIVE ng/mL (ref <2.5)
Oxazepam: NEGATIVE ng/mL (ref <0.50)
Phencyclidine: NEGATIVE ng/mL (ref <10)
Tapentadol: NEGATIVE ng/mL (ref <5.0)
Temazepam: NEGATIVE ng/mL (ref <0.50)
Tramadol: NEGATIVE ng/mL (ref <5.0)
Triazolam: NEGATIVE ng/mL (ref <0.50)
Zolpidem: NEGATIVE ng/mL (ref <5.0)

## 2023-01-05 ENCOUNTER — Encounter: Payer: Self-pay | Admitting: Internal Medicine

## 2023-01-06 MED ORDER — CLONAZEPAM 0.5 MG PO TABS: 0.5000 mg | ORAL_TABLET | Freq: Four times a day (QID) | ORAL | 2 refills | Status: DC | PRN

## 2023-01-06 NOTE — Telephone Encounter (Signed)
PDMP okay, Rx sent 

## 2023-01-06 NOTE — Telephone Encounter (Signed)
Requesting: clonazepam 0.5mg   Contract: 12/05/20 UDS: 01/01/23 Last Visit: 01/01/23 Next Visit: None Last Refill: 08/26/22 #120 and 2RF   Please Advise

## 2023-01-08 ENCOUNTER — Ambulatory Visit: Payer: Medicare PPO | Admitting: Obstetrics & Gynecology

## 2023-01-08 ENCOUNTER — Encounter: Payer: Self-pay | Admitting: Obstetrics & Gynecology

## 2023-01-08 VITALS — BP 148/63 | HR 77 | Wt 123.0 lb

## 2023-01-08 DIAGNOSIS — N904 Leukoplakia of vulva: Secondary | ICD-10-CM | POA: Diagnosis not present

## 2023-01-08 NOTE — Progress Notes (Signed)
History:  77 y.o. [id number] here today for f/u of a 6 month f/u of lichen sclerosis. Pt denies sx. She gets her annual exam from her primary care provider.  She denies any new GYN sx.   The following portions of the patient's history were reviewed and updated as appropriate: allergies, current medications, past family history, past medical history, past social history, past surgical history and problem list.  Review of Systems:  Pertinent items are noted in HPI.    Objective:  Physical Exam Blood pressure (!) 148/63, pulse 77, weight 123 lb (55.8 kg).  CONSTITUTIONAL: Well-developed, well-nourished female in no acute distress.  HENT:  Normocephalic, atraumatic EYES: Conjunctivae and EOM are normal. No scleral icterus.  NECK: Normal range of motion SKIN: Skin is warm and dry. No rash noted. Not diaphoretic.No pallor. NEUROLGIC: Alert and oriented to person, place, and time. Normal coordination.  Pelvic: Normal appearing external genitalia; normal appearing vaginal mucosa and cervix.  No lesions noted No new skin changes. Normal discharge.  Of note: a bimanual was performed today. Small uterus, no other palpable masses, no uterine or adnexal tenderness.   Assessment & Plan:  Diagnoses and all orders for this visit:  Lichen sclerosus of female genitalia   F/u in 6 months or sooner prn  Pt is UTD with all of her age appropriate screening.   Kasee Hantz L. Harraway-Smith, M.D., [place] [place]

## 2023-01-08 NOTE — Progress Notes (Signed)
Pt presents for 6 month f/u. Pt has not noticed any improvements since last visit. No other concerns.

## 2023-01-13 DIAGNOSIS — F411 Generalized anxiety disorder: Secondary | ICD-10-CM | POA: Diagnosis not present

## 2023-01-19 ENCOUNTER — Encounter: Payer: Self-pay | Admitting: Obstetrics & Gynecology

## 2023-01-20 ENCOUNTER — Encounter: Payer: Self-pay | Admitting: *Deleted

## 2023-01-21 DIAGNOSIS — M1120 Other chondrocalcinosis, unspecified site: Secondary | ICD-10-CM | POA: Diagnosis not present

## 2023-01-21 DIAGNOSIS — M6530 Trigger finger, unspecified finger: Secondary | ICD-10-CM | POA: Diagnosis not present

## 2023-01-30 DIAGNOSIS — F411 Generalized anxiety disorder: Secondary | ICD-10-CM | POA: Diagnosis not present

## 2023-01-31 ENCOUNTER — Telehealth: Payer: Self-pay | Admitting: Physician Assistant

## 2023-01-31 DIAGNOSIS — K582 Mixed irritable bowel syndrome: Secondary | ICD-10-CM

## 2023-01-31 NOTE — Telephone Encounter (Signed)
Inbound call from patient requesting refill for lomotil. Please advise.

## 2023-02-03 ENCOUNTER — Encounter: Payer: Self-pay | Admitting: Internal Medicine

## 2023-02-03 MED ORDER — DIPHENOXYLATE-ATROPINE 2.5-0.025 MG PO TABS: 1.0000 | ORAL_TABLET | Freq: Every day | ORAL | 1 refills | Status: DC

## 2023-02-03 NOTE — Telephone Encounter (Signed)
Lomotil sent to [facility] in [facility]

## 2023-02-10 DIAGNOSIS — F411 Generalized anxiety disorder: Secondary | ICD-10-CM | POA: Diagnosis not present

## 2023-02-21 ENCOUNTER — Telehealth: Payer: Self-pay | Admitting: Internal Medicine

## 2023-02-21 ENCOUNTER — Telehealth (INDEPENDENT_AMBULATORY_CARE_PROVIDER_SITE_OTHER): Payer: Medicare PPO | Admitting: Internal Medicine

## 2023-02-21 ENCOUNTER — Encounter: Payer: Self-pay | Admitting: Internal Medicine

## 2023-02-21 VITALS — BP 146/63 | HR 64 | Ht 64.0 in | Wt 123.0 lb

## 2023-02-21 DIAGNOSIS — K529 Noninfective gastroenteritis and colitis, unspecified: Secondary | ICD-10-CM

## 2023-02-21 MED ORDER — ONDANSETRON HCL 8 MG PO TABS: 8.0000 mg | ORAL_TABLET | Freq: Three times a day (TID) | ORAL | 0 refills | Status: DC | PRN

## 2023-02-21 NOTE — Progress Notes (Unsigned)
Subjective:    Patient ID: Linda Jefferson, female    DOB: 05-11-1946, 77 y.o.   MRN: [id number]  DOS:  02/21/2023 Type of visit - description: Virtual Visit via Video Note  I connected with the above patient  by a video enabled telemedicine application and verified that I am speaking with the correct person using two identifiers.   Location of patient: home  Location of provider: office  Persons participating in the virtual visit: patient, provider   I discussed the limitations of evaluation and management by telemedicine and the availability of in person appointments. The patient expressed understanding and agreed to proceed.  Acute Symptoms started 4 days ago with abdominal cramps, nausea, few chills.  Symptoms have been on and off, at some point temperature was 100.1. Nausea stopped 2 days ago.  Diarrhea and abdominal cramps continue.  Yesterday where severe but today is much improved. Diarrhea is decreasing.  She is pushing fluids. Urine output seems okay and the color of the urine this is normal. No hematemesis, stools are watery but no blood. Yesterday she felt lightheaded but today when she stands up she feels okay.  Review of Systems See above   Past Medical History:  Diagnosis Date   Anxiety    Carotid artery disease (HCC)    per u/s 01/2009- repeated u/s 7/11 "mild dz, no stenosis"   Colitis    Dyslipidemia    Food allergy    mushrooms   Gallstones    Hyperlipidemia    Hypertension    IBS (irritable bowel syndrome)    LBP (low back pain) 3/11   after a local injection, f/u [facility]   Normal cardiac stress test 04/2009   Osteopenia    Seasonal allergies    Syncope 01/2009   holter atrail tachycardia   Wears hearing aid     Past Surgical History:  Procedure Laterality Date   BUNIONECTOMY Bilateral 03/2013   CARPAL TUNNEL RELEASE Right    CATARACT EXTRACTION Bilateral 2020   Dr.Beavis   CHOLECYSTECTOMY     TOTAL SHOULDER ARTHROPLASTY Left  04/2016   TRIGGER FINGER RELEASE  03/31/2012   Procedure: RELEASE TRIGGER FINGER/A-1 PULLEY;  Surgeon: Wyn Forster., MD;  [facility];  Service: Orthopedics;  Laterality: Right;  right index    Current Outpatient Medications  Medication Instructions   amLODipine-benazepril (LOTREL) 5-10 MG capsule TAKE 1 CAPSULE BY MOUTH EVERY DAY   Artificial Tear Ointment (DRY EYES OP) 2 drops, As needed   azelastine (ASTELIN) 0.1 % nasal spray 2 sprays, Each Nare, 2 times daily   clonazePAM (KLONOPIN) 0.5 mg, Oral, 4 times daily PRN   diclofenac (VOLTAREN) 50 mg, Oral, 2 times daily PRN   diclofenac Sodium (VOLTAREN) 2 g, Topical, 4 times daily PRN   diphenoxylate-atropine (LOMOTIL) 2.5-0.025 MG tablet 1 tablet, Oral, Daily   ESTRACE VAGINAL 0.5 g, Vaginal, Daily, Apply to affected area.   finasteride (PROSCAR) 5 mg, Oral, Daily   hydrocortisone (ANUSOL-HC) 2.5 % rectal cream 1 Application, Rectal, 2 times daily, For 7 days   lidocaine (XYLOCAINE) 2 % jelly 1 Application, Topical, As needed   minoxidil (LONITEN) 1.25 mg, Daily   Multiple Vitamin (MULTIVITAMIN) capsule 1 capsule, Daily   ondansetron (ZOFRAN) 8 mg, Oral, Every 8 hours PRN   raloxifene (EVISTA) 60 MG tablet TAKE 1 TABLET BY MOUTH EVERY DAY   simvastatin (ZOCOR) 20 MG tablet TAKE 1 TABLET BY MOUTH EVERYDAY AT BEDTIME  VITAMIN D PO 3,000 Units, Daily       Objective:   Physical Exam BP (!) 146/63   Pulse 64   Ht 5\' 4"  (1.626 m)   Wt 123 lb (55.8 kg)   BMI 21.11 kg/m  This is a virtual video visit, she looks well, alert oriented x 3, in no distress.  Sitting comfortably on a chair    Assessment     Assessment HTN Hyperlipidemia Osteopenia  Per dexa 2011,  2014 (Tt score  - 1.7), and 11/2016.  2023: T-score -1.9. On Evista d/t FH breast ca Anxiety, insomnia -- on clonazepam qid  Seasonal (and food?) allergies Dermatology: - Alopecia Female pattern, lichen planopilaris; saw derm 04-2017   -Lichen  planus DX gynecology 03/2020 [facility], hearing aids MSK: --Back DJD and spinal stenosis per MRI 06-2017 --- pseudogout - sees rheumat. H/o Syncope, 2010:   --Nl  stress test, Holter: Atrial tachycardia --Carotid ultrasound 2010,2011 , 2014 no significant stenosis FH: +FH CAD, mother age 88 + FH Breast ca- mother, sister x2, aunt --- on Evista  GI: -IBS, saw GI 08/03/2018 -Admitted colitis 10-2016   PLAN: Acute gastroenteritis: Symptoms started 4 days ago, had fever for ~ 1 day, overall today she feels better. She has been able to push fluids, urine output is okay, no orthostatic symptoms. Plan: Continue pushing fluids, continue Pepto-Bismol as needed for diarrhea, Zofran was sent this morning, to take only if nausea is present. She skipped 2 doses of Lotrel this week, recommend to skip 1 more time tonight and then go back on it as long as she feels better. Reach out for questions.   I discussed the assessment and treatment plan with the patient. The patient was provided an opportunity to ask questions and all were answered. The patient agreed with the plan and demonstrated an understanding of the instructions.   The patient was advised to call back or seek an in-person evaluation if the symptoms worsen or if the condition fails to improve as anticipated.

## 2023-02-21 NOTE — Telephone Encounter (Signed)
Spoke w/ Pt-informed of recommendations. Pt verbalized understanding. Rx sent. She will keep appt at 4pm w/ PCP virtually.

## 2023-02-21 NOTE — Telephone Encounter (Signed)
Please advise 

## 2023-02-21 NOTE — Telephone Encounter (Signed)
Seems like she has acute gastroenteritis. If severe symptoms, unable to drink plenty of fluids to keep up with diarrhea and vomiting, severe stomach pain, getting worse: Go to the ER. Otherwise we could attempt to treat this at home. Recommend to continue pushing fluids, not worry too much about solids for the next 24 hours. Call Zofran 8 mg 3 times daily as needed nausea, #21 no refills. Also recommend to hold Lotrel for 1 or 2 days.

## 2023-02-21 NOTE — Addendum Note (Signed)
Addended byConrad Ritchie D on: 02/21/2023 10:38 AM   Modules accepted: Orders

## 2023-02-21 NOTE — Telephone Encounter (Signed)
Pt called to advise that she has had a low grade fever (100.1) and she has been having diarrhea (water consistency). She said at one point she was on the toilet and had the trash can in her lap. She said that she has taken pepto bismol,pedialyte and gatorade . She has tried plain animal crackers. Pt doesn't feel like she can come in as she is afraid to leave a bathroom. Pt would like a call to see what else she can do. I scheduled a mychart visit with Dr. Drue Novel today at 4 in case she has to see the provider she will have it but if she does not need to be seen then let her know. Please call to advise.

## 2023-02-22 NOTE — Assessment & Plan Note (Signed)
Acute gastroenteritis: Symptoms started 4 days ago, had fever for ~ 1 day, overall today she feels better. She has been able to push fluids, urine output is okay, no orthostatic symptoms. Plan: Continue pushing fluids, continue Pepto-Bismol as needed for diarrhea, Zofran was sent this morning, to take only if nausea is present. She skipped 2 doses of Lotrel this week, recommend to skip 1 more time tonight and then go back on it as long as she feels better. Reach out for questions.

## 2023-02-24 DIAGNOSIS — F411 Generalized anxiety disorder: Secondary | ICD-10-CM | POA: Diagnosis not present

## 2023-02-27 DIAGNOSIS — Z1231 Encounter for screening mammogram for malignant neoplasm of breast: Secondary | ICD-10-CM | POA: Diagnosis not present

## 2023-02-27 LAB — HM MAMMOGRAPHY

## 2023-03-04 ENCOUNTER — Encounter: Payer: Self-pay | Admitting: Internal Medicine

## 2023-03-10 DIAGNOSIS — F411 Generalized anxiety disorder: Secondary | ICD-10-CM | POA: Diagnosis not present

## 2023-03-24 DIAGNOSIS — F411 Generalized anxiety disorder: Secondary | ICD-10-CM | POA: Diagnosis not present

## 2023-03-28 ENCOUNTER — Other Ambulatory Visit: Payer: Self-pay | Admitting: Internal Medicine

## 2023-04-07 DIAGNOSIS — F411 Generalized anxiety disorder: Secondary | ICD-10-CM | POA: Diagnosis not present

## 2023-04-21 DIAGNOSIS — F411 Generalized anxiety disorder: Secondary | ICD-10-CM | POA: Diagnosis not present

## 2023-04-23 ENCOUNTER — Other Ambulatory Visit: Payer: Self-pay | Admitting: Internal Medicine

## 2023-04-24 ENCOUNTER — Ambulatory Visit (INDEPENDENT_AMBULATORY_CARE_PROVIDER_SITE_OTHER): Payer: Medicare PPO | Admitting: Emergency Medicine

## 2023-04-24 VITALS — Ht 64.0 in | Wt 122.0 lb

## 2023-04-24 DIAGNOSIS — Z0000 Encounter for general adult medical examination without abnormal findings: Secondary | ICD-10-CM | POA: Diagnosis not present

## 2023-04-24 NOTE — Patient Instructions (Signed)
Linda Jefferson , Thank you for taking time to come for your Medicare Wellness Visit. I appreciate your ongoing commitment to your health goals. Please review the following plan we discussed and let me know if I can assist you in the future.   These are the goals we discussed:  Goals       Patient Stated (pt-stated)      Would like to get out more.        This is a list of the screening recommended for you and due dates:  Health Maintenance  Topic Date Due   Zoster (Shingles) Vaccine (1 of 2) Never done   DTaP/Tdap/Td vaccine (2 - Tdap) 02/05/2019   COVID-19 Vaccine (6 - 2023-24 season) 06/07/2022   DEXA scan (bone density measurement)  01/04/2024   Mammogram  02/27/2024   Medicare Annual Wellness Visit  04/23/2024   Pneumonia Vaccine  Completed   Hepatitis C Screening  Completed   HPV Vaccine  Aged Out   Colon Cancer Screening  Discontinued    Advanced directives: on file  Conditions/risks identified: Keep up the good work.  Next appointment: Follow up in one year for your annual wellness visit 04/27/24 @ 9:40am   Preventive Care 65 Years and Older, Female Preventive care refers to lifestyle choices and visits with your health care provider that can promote health and wellness. What does preventive care include? A yearly physical exam. This is also called an annual well check. Dental exams once or twice a year. Routine eye exams. Ask your health care provider how often you should have your eyes checked. Personal lifestyle choices, including: Daily care of your teeth and gums. Regular physical activity. Eating a healthy diet. Avoiding tobacco and drug use. Limiting alcohol use. Practicing safe sex. Taking low-dose aspirin every day. Taking vitamin and mineral supplements as recommended by your health care provider. What happens during an annual well check? The services and screenings done by your health care provider during your annual well check will depend on your age,  overall health, lifestyle risk factors, and family history of disease. Counseling  Your health care provider may ask you questions about your: Alcohol use. Tobacco use. Drug use. Emotional well-being. Home and relationship well-being. Sexual activity. Eating habits. History of falls. Memory and ability to understand (cognition). Work and work Astronomer. Reproductive health. Screening  You may have the following tests or measurements: Height, weight, and BMI. Blood pressure. Lipid and cholesterol levels. These may be checked every 5 years, or more frequently if you are over 78 years old. Skin check. Lung cancer screening. You may have this screening every year starting at age 78 if you have a 30-pack-year history of smoking and currently smoke or have quit within the past 15 years. Fecal occult blood test (FOBT) of the stool. You may have this test every year starting at age 78. Flexible sigmoidoscopy or colonoscopy. You may have a sigmoidoscopy every 5 years or a colonoscopy every 10 years starting at age 78. Hepatitis C blood test. Hepatitis B blood test. Sexually transmitted disease (STD) testing. Diabetes screening. This is done by checking your blood sugar (glucose) after you have not eaten for a while (fasting). You may have this done every 1-3 years. Bone density scan. This is done to screen for osteoporosis. You may have this done starting at age 78. Mammogram. This may be done every 1-2 years. Talk to your health care provider about how often you should have regular mammograms. Talk with your  health care provider about your test results, treatment options, and if necessary, the need for more tests. Vaccines  Your health care provider may recommend certain vaccines, such as: Influenza vaccine. This is recommended every year. Tetanus, diphtheria, and acellular pertussis (Tdap, Td) vaccine. You may need a Td booster every 10 years. Zoster vaccine. You may need this after age  78. Pneumococcal 13-valent conjugate (PCV13) vaccine. One dose is recommended after age 78. Pneumococcal polysaccharide (PPSV23) vaccine. One dose is recommended after age 78. Talk to your health care provider about which screenings and vaccines you need and how often you need them. This information is not intended to replace advice given to you by your health care provider. Make sure you discuss any questions you have with your health care provider. Document Released: 10/20/2015 Document Revised: 06/12/2016 Document Reviewed: 07/25/2015 Elsevier Interactive Patient Education  2017 [employer].  Fall Prevention in the Home Falls can cause injuries. They can happen to people of all ages. There are many things you can do to make your home safe and to help prevent falls. What can I do on the outside of my home? Regularly fix the edges of walkways and driveways and fix any cracks. Remove anything that might make you trip as you walk through a door, such as a raised step or threshold. Trim any bushes or trees on the path to your home. Use bright outdoor lighting. Clear any walking paths of anything that might make someone trip, such as rocks or tools. Regularly check to see if handrails are loose or broken. Make sure that both sides of any steps have handrails. Any raised decks and porches should have guardrails on the edges. Have any leaves, snow, or ice cleared regularly. Use sand or salt on walking paths during winter. Clean up any spills in your garage right away. This includes oil or grease spills. What can I do in the bathroom? Use night lights. Install grab bars by the toilet and in the tub and shower. Do not use towel bars as grab bars. Use non-skid mats or decals in the tub or shower. If you need to sit down in the shower, use a plastic, non-slip stool. Keep the floor dry. Clean up any water that spills on the floor as soon as it happens. Remove soap buildup in the tub or shower  regularly. Attach bath mats securely with double-sided non-slip rug tape. Do not have throw rugs and other things on the floor that can make you trip. What can I do in the bedroom? Use night lights. Make sure that you have a light by your bed that is easy to reach. Do not use any sheets or blankets that are too big for your bed. They should not hang down onto the floor. Have a firm chair that has side arms. You can use this for support while you get dressed. Do not have throw rugs and other things on the floor that can make you trip. What can I do in the kitchen? Clean up any spills right away. Avoid walking on wet floors. Keep items that you use a lot in easy-to-reach places. If you need to reach something above you, use a strong step stool that has a grab bar. Keep electrical cords out of the way. Do not use floor polish or wax that makes floors slippery. If you must use wax, use non-skid floor wax. Do not have throw rugs and other things on the floor that can make you trip. What  can I do with my stairs? Do not leave any items on the stairs. Make sure that there are handrails on both sides of the stairs and use them. Fix handrails that are broken or loose. Make sure that handrails are as long as the stairways. Check any carpeting to make sure that it is firmly attached to the stairs. Fix any carpet that is loose or worn. Avoid having throw rugs at the top or bottom of the stairs. If you do have throw rugs, attach them to the floor with carpet tape. Make sure that you have a light switch at the top of the stairs and the bottom of the stairs. If you do not have them, ask someone to add them for you. What else can I do to help prevent falls? Wear shoes that: Do not have high heels. Have rubber bottoms. Are comfortable and fit you well. Are closed at the toe. Do not wear sandals. If you use a stepladder: Make sure that it is fully opened. Do not climb a closed stepladder. Make sure that  both sides of the stepladder are locked into place. Ask someone to hold it for you, if possible. Clearly mark and make sure that you can see: Any grab bars or handrails. First and last steps. Where the edge of each step is. Use tools that help you move around (mobility aids) if they are needed. These include: Canes. Walkers. Scooters. Crutches. Turn on the lights when you go into a dark area. Replace any light bulbs as soon as they burn out. Set up your furniture so you have a clear path. Avoid moving your furniture around. If any of your floors are uneven, fix them. If there are any pets around you, be aware of where they are. Review your medicines with your doctor. Some medicines can make you feel dizzy. This can increase your chance of falling. Ask your doctor what other things that you can do to help prevent falls. This information is not intended to replace advice given to you by your health care provider. Make sure you discuss any questions you have with your health care provider. Document Released: 07/20/2009 Document Revised: 02/29/2016 Document Reviewed: 10/28/2014 Elsevier Interactive Patient Education  2017 [employer].

## 2023-04-24 NOTE — Progress Notes (Signed)
Subjective:   Per patient no change in vitals since last visit, unable to obtain new vitals due to telehealth visit   Linda Jefferson is a 77 y.o. female who presents for Medicare Annual (Subsequent) preventive examination.  Visit Complete: Virtual  I connected with  Linda Jefferson on 04/24/23 by a audio enabled telemedicine application and verified that I am speaking with the correct person using two identifiers.  Patient Location: Home  Provider Location: Home Office  I discussed the limitations of evaluation and management by telemedicine. The patient expressed understanding and agreed to proceed.  Patient Medicare AWV questionnaire was completed by the patient on 04/23/23; I have confirmed that all information answered by patient is correct and no changes since this date.  Review of Systems     Cardiac Risk Factors include: advanced age (>109men, >20 women);hypertension;dyslipidemia;Other (see comment), Risk factor comments: known CAD     Objective:    Today's Vitals   04/24/23 0927  Weight: 122 lb (55.3 kg)  Height: 5\' 4"  (1.626 m)   Body mass index is 20.94 kg/m.     04/24/2023    9:45 AM 04/22/2022    8:38 AM 04/16/2021    9:51 AM 12/17/2019    9:34 AM 12/15/2018    2:36 PM 12/12/2017    2:36 PM 11/25/2016    2:36 PM  Advanced Directives  Does Patient Have a Medical Advance Directive? Yes Yes Yes Yes Yes Yes Yes  Type of Estate agent of [place];Living will Healthcare Power of [place];Living will Healthcare Power of [place];Living will Healthcare Power of [place];Living will Healthcare Power of [place];Living will Healthcare Power of [place];Living will Living will;Healthcare Power of Attorney  Does patient want to make changes to medical advance directive? No - Patient declined   No - Patient declined No - Patient declined  No - Patient declined  Copy of Healthcare Power of Attorney in Chart? Yes - validated most recent copy scanned in chart  (See row information) Yes - validated most recent copy scanned in chart (See row information) Yes - validated most recent copy scanned in chart (See row information) No - copy requested No - copy requested No - copy requested No - copy requested    Current Medications (verified) Outpatient Encounter Medications as of 04/24/2023  Medication Sig   amLODipine-benazepril (LOTREL) 5-10 MG capsule TAKE 1 CAPSULE BY MOUTH EVERY DAY   Artificial Tear Ointment (DRY EYES OP) Place 2 drops into both eyes as needed (for dry eyes).   azelastine (ASTELIN) 0.1 % nasal spray PLACE 2 SPRAYS INTO BOTH NOSTRILS 2 (TWO) TIMES DAILY.   Biotin 5000 MCG CAPS Take 1 capsule by mouth daily.   clonazePAM (KLONOPIN) 0.5 MG tablet Take 1 tablet (0.5 mg total) by mouth 4 (four) times daily as needed for anxiety.   diclofenac (VOLTAREN) 50 MG EC tablet Take 1 tablet (50 mg total) by mouth 2 (two) times daily as needed for moderate pain.   diclofenac Sodium (VOLTAREN) 1 % GEL Apply 2 g topically 4 (four) times daily as needed.   diphenoxylate-atropine (LOMOTIL) 2.5-0.025 MG tablet Take 1 tablet by mouth daily.   ESTRACE VAGINAL 0.1 MG/GM vaginal cream Place 0.5 g vaginally daily. Apply to affected area.   finasteride (PROSCAR) 5 MG tablet Take 1 tablet (5 mg total) by mouth daily.   hydrocortisone (ANUSOL-HC) 2.5 % rectal cream Place 1 Application rectally 2 (two) times daily. For 7 days   lidocaine (XYLOCAINE) 2 % jelly  Apply 1 Application topically as needed.   minoxidil (LONITEN) 2.5 MG tablet Take 1.25 mg by mouth daily.   Multiple Vitamin (MULTIVITAMIN) capsule Take 1 capsule by mouth daily.   raloxifene (EVISTA) 60 MG tablet TAKE 1 TABLET BY MOUTH EVERY DAY   simvastatin (ZOCOR) 20 MG tablet Take 1 tablet (20 mg total) by mouth at bedtime.   VITAMIN D PO Take 3,000 Units by mouth daily.   ondansetron (ZOFRAN) 8 MG tablet Take 1 tablet (8 mg total) by mouth every 8 (eight) hours as needed for nausea or vomiting.  (Patient not taking: Reported on 04/24/2023)   No facility-administered encounter medications on file as of 04/24/2023.    Allergies (verified) Mushroom extract complex, Shellfish allergy, Oxycodone, Egg-derived products, Pseudoeph-doxylamine-dm-apap, and Tape   History: Past Medical History:  Diagnosis Date   Anxiety    Carotid artery disease (HCC)    per u/s 01/2009- repeated u/s 7/11 "mild dz, no stenosis"   Colitis    Dyslipidemia    Food allergy    mushrooms   Gallstones    Hyperlipidemia    Hypertension    IBS (irritable bowel syndrome)    LBP (low back pain) 3/11   after a local injection, f/u [facility]   Normal cardiac stress test 04/2009   Osteopenia    Seasonal allergies    Syncope 01/2009   holter atrail tachycardia   Wears hearing aid    Past Surgical History:  Procedure Laterality Date   BUNIONECTOMY Bilateral 03/2013   CARPAL TUNNEL RELEASE Right    CATARACT EXTRACTION Bilateral 2020   Dr.Beavis   CHOLECYSTECTOMY     TOTAL SHOULDER ARTHROPLASTY Left 04/2016   TRIGGER FINGER RELEASE  03/31/2012   Procedure: RELEASE TRIGGER FINGER/A-1 PULLEY;  Surgeon: Wyn Forster., MD;  Location: [facility];  Service: Orthopedics;  Laterality: Right;  right index   Family History  Problem Relation Age of Onset   Hypertension Father    Other Father        cerebral hemorrhage   Coronary artery disease Mother 38   Stroke Mother    Diabetes Mother    Breast cancer Mother    Hypertension Mother    Hypertension Sister    Breast cancer Maternal Aunt        x 2   Breast cancer Paternal Aunt    Colon cancer Neg Hx    Social History   Socioeconomic History   Marital status: Married    Spouse name: Not on file   Number of children: 3   Years of education: Not on file   Highest education level: Not on file  Occupational History   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[employer]  Tobacco Use   Smoking status: Never    Smokeless tobacco: Never  Vaping Use   Vaping status: Never Used  Substance and Sexual Activity   Alcohol use: Yes    Alcohol/week: 2.0 standard drinks of alcohol    Types: 2 Glasses of wine per week    Comment: socially   Drug use: No   Sexual activity: Not Currently  Other Topics Concern   Not on file  Social History Narrative   Household: pt and husband   3 children, one in [place] (PhD)               Social Determinants of Health   Financial Resource Strain: Low Risk  (04/23/2023)   Overall  Financial Resource Strain (CARDIA)    Difficulty of Paying Living Expenses: Not hard at all  Food Insecurity: No Food Insecurity (04/23/2023)   Hunger Vital Sign    Worried About Running Out of Food in the Last Year: Never true    Ran Out of Food in the Last Year: Never true  Transportation Needs: No Transportation Needs (04/23/2023)   PRAPARE - Administrator, Civil Service (Medical): No    Lack of Transportation (Non-Medical): No  Physical Activity: Sufficiently Active (04/23/2023)   Exercise Vital Sign    Days of Exercise per Week: 7 days    Minutes of Exercise per Session: 30 min  Stress: Stress Concern Present (04/23/2023)   [employer] of Occupational Health - Occupational Stress Questionnaire    Feeling of Stress : To some extent  Social Connections: Socially Integrated (04/23/2023)   Social Connection and Isolation Panel [NHANES]    Frequency of Communication with Friends and Family: Three times a week    Frequency of Social Gatherings with Friends and Family: More than three times a week    Attends Religious Services: More than 4 times per year    Active Member of [employer] or Organizations: Yes    Attends Banker Meetings: Never    Marital Status: Married    Tobacco Counseling Counseling given: Not Answered   Clinical Intake:  Pre-visit preparation completed: Yes  Pain : No/denies pain     BMI - recorded: 20.94 Nutritional Status:  BMI of 19-24  Normal Nutritional Risks: None Diabetes: No  How often do you need to have someone help you when you read instructions, pamphlets, or other written materials from your doctor or pharmacy?: 1 - Never  Interpreter Needed?: No  Information entered by :: Tora Kindred, CMA   Activities of Daily Living    04/23/2023   11:41 AM  In your present state of health, do you have any difficulty performing the following activities:  Hearing? 1  Vision? 0  Difficulty concentrating or making decisions? 0  Walking or climbing stairs? 0  Dressing or bathing? 0  Doing errands, shopping? 0  Preparing Food and eating ? N  Using the Toilet? N  In the past six months, have you accidently leaked urine? N  Do you have problems with loss of bowel control? N  Managing your Medications? N  Managing your Finances? N  Housekeeping or managing your Housekeeping? N    Patient Care Team: Wanda Plump, MD as PCP - General (Internal Medicine) Larence Penning, OD as Referring Physician (Optometry) Jodi Geralds, MD as Consulting Physician (Orthopedic Surgery) Mikki Santee, MD (Inactive) as Consulting Physician (Allergy and Immunology) Windy Carina, PA-C (Inactive) as Physician Assistant (Rheumatology)  Indicate any recent Medical Services you may have received from other than [facility] providers in the past year (date may be approximate).     Assessment:   This is a routine wellness examination for Linda Jefferson.  Hearing/Vision screen Hearing Screening - Comments:: Wear hearing aids  Dietary issues and exercise activities discussed:     Goals Addressed               This Visit's Progress     Patient Stated (pt-stated)        Would like to get out more.      Depression Screen    04/24/2023    9:40 AM 01/01/2023   10:43 AM 05/21/2022    2:27 PM 04/22/2022    8:42 AM  12/27/2021    8:16 AM 04/16/2021    9:58 AM 12/05/2020    8:31 AM  PHQ 2/9 Scores  PHQ - 2 Score 0 0 0 0 0 0 2  PHQ- 9 Score  0      5    Fall Risk    04/23/2023   11:41 AM 01/01/2023   10:43 AM 05/21/2022    2:27 PM 04/22/2022    8:39 AM 12/27/2021    8:16 AM  Fall Risk   Falls in the past year? 0 0 0 0 0  Number falls in past yr: 0 0 0 0 0  Injury with Fall? 0 0 0 0 0  Risk for fall due to : No Fall Risks      Follow up Falls prevention discussed Falls evaluation completed Falls evaluation completed Falls prevention discussed Falls evaluation completed    MEDICARE RISK AT HOME:   TIMED UP AND GO:  Was the test performed?  No    Cognitive Function:    11/25/2016    2:37 PM  MMSE - Mini Mental State Exam  Orientation to time 5  Orientation to Place 5  Registration 3  Attention/ Calculation 5  Recall 3  Language- name 2 objects 2  Language- repeat 1  Language- follow 3 step command 3  Language- read & follow direction 1  Write a sentence 1  Copy design 1  Total score 30        04/24/2023    9:46 AM  6CIT Screen  What Year? 0 points  What month? 0 points  What time? 0 points  Count back from 20 0 points  Months in reverse 0 points  Repeat phrase 0 points  Total Score 0 points    Immunizations Immunization History  Administered Date(s) Administered   Influenza Inj Mdck Quad Pf 07/13/2019, 07/14/2020, 07/17/2021, 07/16/2022   Influenza, Quadrivalent, Recombinant, Inj, Pf 08/15/2016, 08/05/2017, 07/10/2018   Moderna Covid-19 Vaccine Bivalent Booster 77yrs & up 08/17/2021   Moderna Sars-Covid-2 Vaccination 12/05/2019, 01/02/2020, 08/22/2020, 02/19/2021   Pneumococcal Conjugate-13 05/13/2019   Pneumococcal Polysaccharide-23 11/16/2019   Td 02/04/2009    TDAP status: Due, Education has been provided regarding the importance of this vaccine. Advised may receive this vaccine at local pharmacy or Health Dept. Aware to provide a copy of the vaccination record if obtained from local pharmacy or Health Dept. Verbalized acceptance and understanding.  Flu Vaccine status: Up to  date  Pneumococcal vaccine status: Up to date  Covid-19 vaccine status: Completed vaccines  Qualifies for Shingles Vaccine? Yes   Zostavax completed No   Shingrix Completed?: No.    Education has been provided regarding the importance of this vaccine. Patient has been advised to call insurance company to determine out of pocket expense if they have not yet received this vaccine. Advised may also receive vaccine at local pharmacy or Health Dept. Verbalized acceptance and understanding. Patient declines Shingrix vaccine.  Screening Tests Health Maintenance  Topic Date Due   Zoster Vaccines- Shingrix (1 of 2) Never done   DTaP/Tdap/Td (2 - Tdap) 02/05/2019   COVID-19 Vaccine (6 - 2023-24 season) 06/07/2022   MAMMOGRAM  02/27/2024   Medicare Annual Wellness (AWV)  04/23/2024   Pneumonia Vaccine 37+ Years old  Completed   DEXA SCAN  Completed   Hepatitis C Screening  Completed   HPV VACCINES  Aged Out   Colonoscopy  Discontinued    Health Maintenance  Health Maintenance Due  Topic Date Due  Zoster Vaccines- Shingrix (1 of 2) Never done   DTaP/Tdap/Td (2 - Tdap) 02/05/2019   COVID-19 Vaccine (6 - 2023-24 season) 06/07/2022    Colorectal cancer screening: Type of screening: Colonoscopy. Completed 12/25/16. Repeat every 10 years  Mammogram status: Completed 02/27/23. Repeat every year  Bone Density status: Completed 01/03/22. Results reflect: Bone density results: OSTEOPENIA. Repeat every 2 years.   Lung Cancer Screening Referral: n/a  Additional Screening:  Hepatitis C Screening: does qualify; Completed 08/10/15  Vision Screening: Recommended annual ophthalmology exams for early detection of glaucoma and other disorders of the eye. Is the patient up to date with their annual eye exam?  Yes  Who is the provider or what is the name of the office in which the patient attends annual eye exams? [facility] - Dr Malachi Bonds If pt is not established with a provider, would they like to  be referred to a provider to establish care? No .   Dental Screening: Recommended annual dental exams for proper oral hygiene  Diabetic Foot Exam: n/a  Community Resource Referral / Chronic Care Management: CRR required this visit?  No   CCM required this visit?  No     Plan:     I have personally reviewed and noted the following in the patient's chart:   Medical and social history Use of alcohol, tobacco or illicit drugs  Current medications and supplements including opioid prescriptions. Patient is not currently taking opioid prescriptions. Functional ability and status Nutritional status Physical activity Advanced directives List of other physicians Hospitalizations, surgeries, and ER visits in previous 12 months Vitals Screenings to include cognitive, depression, and falls Referrals and appointments  In addition, I have reviewed and discussed with patient certain preventive protocols, quality metrics, and best practice recommendations. A written personalized care plan for preventive services as well as general preventive health recommendations were provided to patient.     Tora Kindred, CMA   04/24/2023   After Visit Summary: (MyChart) Due to this being a telephonic visit, the after visit summary with patients personalized plan was offered to patient via MyChart   Nurse Notes: Patient refuses Shingrix vaccine.

## 2023-05-05 DIAGNOSIS — F411 Generalized anxiety disorder: Secondary | ICD-10-CM | POA: Diagnosis not present

## 2023-05-12 ENCOUNTER — Other Ambulatory Visit: Payer: Self-pay | Admitting: Internal Medicine

## 2023-05-15 ENCOUNTER — Telehealth: Payer: Self-pay | Admitting: Internal Medicine

## 2023-05-15 NOTE — Telephone Encounter (Signed)
Requesting: clonazepam 0.5mg   Contract: 12/05/20 UDS: 01/01/23 Last Visit: 01/01/23 Next Visit: None Last Refill: 01/06/2023 and 2RF

## 2023-05-16 ENCOUNTER — Encounter: Payer: Self-pay | Admitting: Internal Medicine

## 2023-05-16 NOTE — Telephone Encounter (Signed)
Spoke with [facility], pt lat refill was 04/04/23    Requesting: klonopin  Contract: 12/13/20 UDS:01/01/23 Last Visit:02/21/23 Next Visit:04/27/24 Last Refill:04/04/23  Please Advise

## 2023-05-19 ENCOUNTER — Encounter: Payer: Self-pay | Admitting: Internal Medicine

## 2023-05-19 ENCOUNTER — Telehealth: Payer: Self-pay | Admitting: Internal Medicine

## 2023-05-19 DIAGNOSIS — F411 Generalized anxiety disorder: Secondary | ICD-10-CM | POA: Diagnosis not present

## 2023-05-19 MED ORDER — CLONAZEPAM 0.5 MG PO TABS: 0.5000 mg | ORAL_TABLET | Freq: Four times a day (QID) | ORAL | 2 refills | Status: DC | PRN

## 2023-05-19 NOTE — Telephone Encounter (Signed)
Refill request has been sent urgently to PCP.

## 2023-05-19 NOTE — Telephone Encounter (Signed)
Pt calling back to follow up on her Clonazepam refill. She has been communicating through [employer]. Message forwarded to provider at 4:40 pm on Friday. Please call patient back to advise

## 2023-05-19 NOTE — Telephone Encounter (Signed)
Rx sent by PCP.  

## 2023-05-19 NOTE — Telephone Encounter (Signed)
Rx sent 

## 2023-05-19 NOTE — Telephone Encounter (Signed)
PDMP okay, Rx sent 

## 2023-05-19 NOTE — Telephone Encounter (Signed)
Opened in error

## 2023-05-29 ENCOUNTER — Other Ambulatory Visit: Payer: Self-pay | Admitting: Medical Genetics

## 2023-05-29 DIAGNOSIS — Z006 Encounter for examination for normal comparison and control in clinical research program: Secondary | ICD-10-CM

## 2023-06-02 DIAGNOSIS — F411 Generalized anxiety disorder: Secondary | ICD-10-CM | POA: Diagnosis not present

## 2023-06-03 ENCOUNTER — Other Ambulatory Visit: Payer: Self-pay | Admitting: Internal Medicine

## 2023-06-06 ENCOUNTER — Encounter: Payer: Self-pay | Admitting: Internal Medicine

## 2023-06-10 DIAGNOSIS — L219 Seborrheic dermatitis, unspecified: Secondary | ICD-10-CM | POA: Diagnosis not present

## 2023-06-10 DIAGNOSIS — L661 Lichen planopilaris: Secondary | ICD-10-CM | POA: Diagnosis not present

## 2023-06-10 DIAGNOSIS — L658 Other specified nonscarring hair loss: Secondary | ICD-10-CM | POA: Diagnosis not present

## 2023-06-13 ENCOUNTER — Telehealth: Payer: Self-pay | Admitting: Gastroenterology

## 2023-06-13 ENCOUNTER — Telehealth: Payer: Self-pay | Admitting: Physician Assistant

## 2023-06-13 NOTE — Telephone Encounter (Signed)
Inbound call from patient request to speak about Nerva treatment that is related to IBS. Patient requesting to speak with a nurse if she this is something she would try. Please advise, thank you.

## 2023-06-13 NOTE — Telephone Encounter (Signed)
Error

## 2023-06-16 ENCOUNTER — Encounter: Payer: Self-pay | Admitting: *Deleted

## 2023-06-16 DIAGNOSIS — F411 Generalized anxiety disorder: Secondary | ICD-10-CM | POA: Diagnosis not present

## 2023-06-16 NOTE — Telephone Encounter (Signed)
Sent patient information regarding self Management for IBS. Information provided by Zannie Cove. Information sent to patient's MyChart email.

## 2023-06-30 DIAGNOSIS — F411 Generalized anxiety disorder: Secondary | ICD-10-CM | POA: Diagnosis not present

## 2023-07-14 DIAGNOSIS — F411 Generalized anxiety disorder: Secondary | ICD-10-CM | POA: Diagnosis not present

## 2023-07-22 ENCOUNTER — Ambulatory Visit (INDEPENDENT_AMBULATORY_CARE_PROVIDER_SITE_OTHER): Payer: Medicare PPO

## 2023-07-22 DIAGNOSIS — Z23 Encounter for immunization: Secondary | ICD-10-CM

## 2023-07-23 ENCOUNTER — Ambulatory Visit: Payer: Medicare PPO | Admitting: Obstetrics & Gynecology

## 2023-07-30 ENCOUNTER — Encounter: Payer: Self-pay | Admitting: Obstetrics & Gynecology

## 2023-07-30 ENCOUNTER — Ambulatory Visit: Payer: Medicare PPO | Admitting: Obstetrics & Gynecology

## 2023-07-30 VITALS — BP 144/64 | HR 79 | Ht 64.0 in | Wt 121.0 lb

## 2023-07-30 DIAGNOSIS — N904 Leukoplakia of vulva: Secondary | ICD-10-CM

## 2023-07-30 DIAGNOSIS — L900 Lichen sclerosus et atrophicus: Secondary | ICD-10-CM | POA: Diagnosis not present

## 2023-07-30 DIAGNOSIS — N362 Urethral caruncle: Secondary | ICD-10-CM | POA: Diagnosis not present

## 2023-07-30 MED ORDER — ESTRACE 0.1 MG/GM VA CREA: 0.5000 g | TOPICAL_CREAM | Freq: Every day | VAGINAL | 4 refills | Status: AC

## 2023-07-30 MED ORDER — CLOBETASOL PROPIONATE 0.05 % EX CREA
1.0000 | TOPICAL_CREAM | Freq: Two times a day (BID) | CUTANEOUS | 2 refills | Status: DC
Start: 2023-07-30 — End: 2023-07-30

## 2023-07-30 NOTE — Progress Notes (Signed)
History:  [name] here today for f/u of lichen sclerosus. She reports that she has had a flare up of IBS. No new GYN sx. She has run out of her steroid cream. Denies itching or burning.    The following portions of the patient's history were reviewed and updated as appropriate: allergies, current medications, past family history, past medical history, past social history, past surgical history and problem list.  Review of Systems:  Pertinent items are noted in HPI.    Objective:  Physical Exam Blood pressure (!) 144/64, pulse 79, height 5\' 4"  (1.626 m), weight 121 lb (54.9 kg).  CONSTITUTIONAL: Well-developed, well-nourished female in no acute distress.  HENT:  Normocephalic, atraumatic EYES: Conjunctivae and EOM are normal. No scleral icterus.  NECK: Normal range of motion SKIN: Skin is warm and dry. No rash noted. Not diaphoretic.No pallor. NEUROLGIC: Alert and oriented to person, place, and time. Normal coordination.  Pelvic: atrophy of the external genitalia; there are no lesions noted.   Assessment & Plan:   Linda Jefferson was seen today for follow-up.  Diagnoses and all orders for this visit:  Lichen sclerosus of female genitalia  Lichen sclerosus -     clobetasol cream (TEMOVATE) 0.05 %; Apply 1 Application topically 2 (two) times daily. Apply to affected area   F/u I 6 months or sooner prn   Oswell Say L. Harraway-Smith, M.D., [place]n Core

## 2023-07-30 NOTE — Addendum Note (Signed)
Addended by: Willodean Rosenthal on: 07/30/2023 04:27 PM   Modules accepted: Orders

## 2023-07-31 DIAGNOSIS — F411 Generalized anxiety disorder: Secondary | ICD-10-CM | POA: Diagnosis not present

## 2023-08-11 DIAGNOSIS — F411 Generalized anxiety disorder: Secondary | ICD-10-CM | POA: Diagnosis not present

## 2023-08-14 ENCOUNTER — Encounter: Payer: Self-pay | Admitting: Internal Medicine

## 2023-09-08 DIAGNOSIS — F411 Generalized anxiety disorder: Secondary | ICD-10-CM | POA: Diagnosis not present

## 2023-09-22 DIAGNOSIS — F411 Generalized anxiety disorder: Secondary | ICD-10-CM | POA: Diagnosis not present

## 2023-09-23 ENCOUNTER — Other Ambulatory Visit: Payer: Self-pay | Admitting: Physician Assistant

## 2023-09-23 ENCOUNTER — Other Ambulatory Visit: Payer: Self-pay | Admitting: Internal Medicine

## 2023-09-23 DIAGNOSIS — K649 Unspecified hemorrhoids: Secondary | ICD-10-CM

## 2023-10-03 ENCOUNTER — Encounter: Payer: Self-pay | Admitting: Internal Medicine

## 2023-10-03 NOTE — Telephone Encounter (Signed)
Requesting: Klonopin  Contract:12/13/2020 UDS:01/01/23 Last Visit:01/01/2023 Next Visit:01/06/2024 Last Refill:05/19/2023  Please Advise

## 2023-10-06 MED ORDER — CLONAZEPAM 0.5 MG PO TABS: 0.5000 mg | ORAL_TABLET | Freq: Four times a day (QID) | ORAL | 2 refills | Status: DC | PRN

## 2023-10-06 NOTE — Telephone Encounter (Signed)
PDMP okay, Rx sent 

## 2023-10-20 DIAGNOSIS — F411 Generalized anxiety disorder: Secondary | ICD-10-CM | POA: Diagnosis not present

## 2023-11-03 DIAGNOSIS — F411 Generalized anxiety disorder: Secondary | ICD-10-CM | POA: Diagnosis not present

## 2023-11-17 DIAGNOSIS — F411 Generalized anxiety disorder: Secondary | ICD-10-CM | POA: Diagnosis not present

## 2023-12-01 DIAGNOSIS — F411 Generalized anxiety disorder: Secondary | ICD-10-CM | POA: Diagnosis not present

## 2023-12-16 DIAGNOSIS — F411 Generalized anxiety disorder: Secondary | ICD-10-CM | POA: Diagnosis not present

## 2023-12-29 DIAGNOSIS — F411 Generalized anxiety disorder: Secondary | ICD-10-CM | POA: Diagnosis not present

## 2024-01-06 ENCOUNTER — Ambulatory Visit (INDEPENDENT_AMBULATORY_CARE_PROVIDER_SITE_OTHER): Payer: Medicare PPO | Admitting: Internal Medicine

## 2024-01-06 ENCOUNTER — Encounter: Payer: Self-pay | Admitting: Internal Medicine

## 2024-01-06 VITALS — BP 120/68 | HR 65 | Temp 98.3°F | Resp 16 | Ht 64.0 in | Wt 126.0 lb

## 2024-01-06 DIAGNOSIS — M8580 Other specified disorders of bone density and structure, unspecified site: Secondary | ICD-10-CM | POA: Diagnosis not present

## 2024-01-06 DIAGNOSIS — F419 Anxiety disorder, unspecified: Secondary | ICD-10-CM

## 2024-01-06 DIAGNOSIS — I10 Essential (primary) hypertension: Secondary | ICD-10-CM

## 2024-01-06 DIAGNOSIS — Z79899 Other long term (current) drug therapy: Secondary | ICD-10-CM

## 2024-01-06 DIAGNOSIS — E7849 Other hyperlipidemia: Secondary | ICD-10-CM | POA: Diagnosis not present

## 2024-01-06 DIAGNOSIS — Z0001 Encounter for general adult medical examination with abnormal findings: Secondary | ICD-10-CM | POA: Diagnosis not present

## 2024-01-06 DIAGNOSIS — Z0000 Encounter for general adult medical examination without abnormal findings: Secondary | ICD-10-CM | POA: Diagnosis not present

## 2024-01-06 LAB — COMPREHENSIVE METABOLIC PANEL WITH GFR
ALT: 21 U/L (ref 0–35)
AST: 32 U/L (ref 0–37)
Albumin: 4.2 g/dL (ref 3.5–5.2)
Alkaline Phosphatase: 59 U/L (ref 39–117)
BUN: 18 mg/dL (ref 6–23)
CO2: 29 meq/L (ref 19–32)
Calcium: 9.7 mg/dL (ref 8.4–10.5)
Chloride: 97 meq/L (ref 96–112)
Creatinine, Ser: 0.66 mg/dL (ref 0.40–1.20)
GFR: 83.75 mL/min (ref >60.00)
Glucose, Bld: 86 mg/dL (ref 70–99)
Potassium: 4.2 meq/L (ref 3.5–5.1)
Sodium: 134 meq/L — ABNORMAL LOW (ref 135–145)
Total Bilirubin: 0.6 mg/dL (ref 0.2–1.2)
Total Protein: 6.7 g/dL (ref 6.0–8.3)

## 2024-01-06 LAB — LIPID PANEL
Cholesterol: 149 mg/dL (ref 0–200)
HDL: 59 mg/dL (ref >39.00)
LDL Cholesterol: 73 mg/dL (ref 0–99)
NonHDL: 90.36
Total CHOL/HDL Ratio: 3
Triglycerides: 86 mg/dL (ref 0.0–149.0)
VLDL: 17.2 mg/dL (ref 0.0–40.0)

## 2024-01-06 LAB — CBC WITH DIFFERENTIAL/PLATELET
Basophils Absolute: 0 10*3/uL (ref 0.0–0.1)
Basophils Relative: 0.8 % (ref 0.0–3.0)
Eosinophils Absolute: 0.1 10*3/uL (ref 0.0–0.7)
Eosinophils Relative: 3.8 % (ref 0.0–5.0)
HCT: 38.9 % (ref 36.0–46.0)
Hemoglobin: 13.1 g/dL (ref 12.0–15.0)
Lymphocytes Relative: 27.7 % (ref 12.0–46.0)
Lymphs Abs: 0.9 10*3/uL (ref 0.7–4.0)
MCHC: 33.7 g/dL (ref 30.0–36.0)
MCV: 91.3 fl (ref 78.0–100.0)
Monocytes Absolute: 0.2 10*3/uL (ref 0.1–1.0)
Monocytes Relative: 7.4 % (ref 3.0–12.0)
Neutro Abs: 2 10*3/uL (ref 1.4–7.7)
Neutrophils Relative %: 60.3 % (ref 43.0–77.0)
Platelets: 306 10*3/uL (ref 150.0–400.0)
RBC: 4.26 Mil/uL (ref 3.87–5.11)
RDW: 13.1 % (ref 11.5–15.5)
WBC: 3.3 10*3/uL — ABNORMAL LOW (ref 4.0–10.5)

## 2024-01-06 MED ORDER — CLONAZEPAM 0.5 MG PO TABS: 0.5000 mg | ORAL_TABLET | Freq: Four times a day (QID) | ORAL | 2 refills | Status: DC | PRN

## 2024-01-06 NOTE — Patient Instructions (Addendum)
 Vaccines I recommend: Shingrix (shingles) RSV Flu shot every fall   Check the  blood pressure regularly Blood pressure goal:  between 110/65 and  135/85. If it is consistently higher or lower, let me know     GO TO THE LAB : Get the blood work     Please go to the front desk: Arrange for a physical exam in 1 year.  Sooner if needed.

## 2024-01-06 NOTE — Progress Notes (Unsigned)
 Subjective:    Patient ID: Linda Jefferson, female    DOB: May 05, 1946, 78 y.o.   MRN: [id number]  DOS:  01/06/2024 Type of visit - description: CPX  Here for CPX. Chronic medical problems addressed. Denies chest pain or difficulty breathing. Her IBS occasionally gave her symptoms, no blood in the stools.   Review of Systems  Other than above, a 14 point review of systems is negative       Past Medical History:  Diagnosis Date   Anxiety    Carotid artery disease (HCC)    per u/s 01/2009- repeated u/s 7/11 "mild dz, no stenosis"   Colitis    Dyslipidemia    Food allergy    mushrooms   Gallstones    Hyperlipidemia    Hypertension    IBS (irritable bowel syndrome)    LBP (low back pain) 3/11   after a local injection, f/u [facility]   Normal cardiac stress test 04/2009   Osteopenia    Seasonal allergies    Syncope 01/2009   holter atrail tachycardia   Wears hearing aid     Past Surgical History:  Procedure Laterality Date   BUNIONECTOMY Bilateral 03/2013   CARPAL TUNNEL RELEASE Right    CATARACT EXTRACTION Bilateral 2020   Dr.Beavis   CHOLECYSTECTOMY     TOTAL SHOULDER ARTHROPLASTY Left 04/2016   TRIGGER FINGER RELEASE  03/31/2012   Procedure: RELEASE TRIGGER FINGER/A-1 PULLEY;  Surgeon: Wyn Forster., MD;  Location: [facility];  Service: Orthopedics;  Laterality: Right;  right index   Social History   Socioeconomic History   Marital status: Married    Spouse name: Not on file   Number of children: 3   Years of education: Not on file   Highest education level: Not on file  Occupational History   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[employer]  Tobacco Use   Smoking status: Never   Smokeless tobacco: Never  Vaping Use   Vaping status: Never Used  Substance and Sexual Activity   Alcohol use: Not Currently    Alcohol/week: 2.0 standard drinks of alcohol    Types: 2 Glasses of wine per week   Drug use: No    Sexual activity: Not Currently  Other Topics Concern   Not on file  Social History Narrative   Household: pt and husband   3 children, one in [place] (PhD)               Social Drivers of Health   Financial Resource Strain: Low Risk  (04/23/2023)   Overall Financial Resource Strain (CARDIA)    Difficulty of Paying Living Expenses: Not hard at all  Food Insecurity: No Food Insecurity (04/23/2023)   Hunger Vital Sign    Worried About Running Out of Food in the Last Year: Never true    Ran Out of Food in the Last Year: Never true  Transportation Needs: No Transportation Needs (04/23/2023)   PRAPARE - Administrator, Civil Service (Medical): No    Lack of Transportation (Non-Medical): No  Physical Activity: Sufficiently Active (04/23/2023)   Exercise Vital Sign    Days of Exercise per Week: 7 days    Minutes of Exercise per Session: 30 min  Stress: Stress Concern Present (04/23/2023)   [employer] of Occupational Health - Occupational Stress Questionnaire    Feeling of Stress : To some extent  Social Connections: Socially Integrated (04/23/2023)  Social Connection and Isolation Panel [NHANES]    Frequency of Communication with Friends and Family: Three times a week    Frequency of Social Gatherings with Friends and Family: More than three times a week    Attends Religious Services: More than 4 times per year    Active Member of [employer] or Organizations: Yes    Attends Banker Meetings: Never    Marital Status: Married  Catering manager Violence: Not At Risk (04/22/2022)   Humiliation, Afraid, Rape, and Kick questionnaire    Fear of Current or Ex-Partner: No    Emotionally Abused: No    Physically Abused: No    Sexually Abused: No     Current Outpatient Medications  Medication Instructions   amLODipine-benazepril (LOTREL) 5-10 MG capsule 1 capsule, Oral, Daily   Artificial Tear Ointment (DRY EYES OP) 2 drops, As needed   azelastine (ASTELIN)  0.1 % nasal spray 2 sprays, Each Nare, 2 times daily   Biotin 5000 MCG CAPS 1 capsule, Daily   clonazePAM (KLONOPIN) 0.5 mg, Oral, 4 times daily PRN   diclofenac (VOLTAREN) 50 mg, Oral, 2 times daily PRN   diclofenac Sodium (VOLTAREN) 2 g, Topical, 4 times daily PRN   ESTRACE VAGINAL 0.5 g, Vaginal, Daily, Apply to affected area.   finasteride (PROSCAR) 5 mg, Oral, Daily   hydrocortisone (ANUSOL-HC) 2.5 % rectal cream 1 Application, Rectal, 2 times daily, For 7 days   lidocaine (XYLOCAINE) 2 % jelly 1 Application, Topical, As needed   minoxidil (LONITEN) 1.25 mg, Daily   Multiple Vitamin (MULTIVITAMIN) capsule 1 capsule, Daily   raloxifene (EVISTA) 60 MG tablet TAKE 1 TABLET BY MOUTH EVERY DAY   simvastatin (ZOCOR) 20 mg, Oral, Daily at bedtime   VITAMIN D PO 3,000 Units, Daily       Objective:   Physical Exam BP 120/68   Pulse 65   Temp 98.3 F (36.8 C) (Oral)   Resp 16   Ht 5\' 4"  (1.626 m)   Wt 126 lb (57.2 kg)   SpO2 96%   BMI 21.63 kg/m  General: Well developed, NAD, BMI noted Neck: No  thyromegaly  HEENT:  Normocephalic . Face symmetric, atraumatic Lungs:  CTA B Normal respiratory effort, no intercostal retractions, no accessory muscle use. Heart: RRR,  no murmur.  Abdomen:  Not distended, soft, non-tender. No rebound or rigidity.   Lower extremities: no pretibial edema bilaterally  Skin: Exposed areas without rash. Not pale. Not jaundice Neurologic:  alert & oriented X3.  Speech normal, gait appropriate for age and unassisted Strength symmetric and appropriate for age.  Psych: Cognition and judgment appear intact.  Cooperative with normal attention span and concentration.  Behavior appropriate. No anxious or depressed appearing.     Assessment     Assessment HTN Hyperlipidemia Osteopenia  Per dexa 2011,  2014 (Tt score  - 1.7), and 11/2016.  2023: T-score -1.9. On Evista d/t FH breast ca) Anxiety, insomnia -- on clonazepam qid  Seasonal (and food?)  allergies Dermatology: - Alopecia Female pattern, lichen planopilaris; saw derm 04-2017   -Lichen planus DX gynecology 03/2020 [facility], hearing aids MSK: --Back DJD and spinal stenosis per MRI 06-2017 --- pseudogout - sees rheumat. FH: +FH CAD, mother age 32 + FH Breast ca- mother, sister x2, aunt --- on Evista  GI: -IBS, saw GI 08/03/2018 -Admitted colitis 10-2016   PLAN: Here for CPX -Td 05/2023 -PNM 13: 05-2019; PNM 23: 11-2019 (had a local reaction) - COVID VAX done 07-2023 -  Rec: RSV,  Shingrex , Flu shot every fall    -Female care: Sees gyn   - Strong FH breast ca, pt reports previous  genetic testing: negative; MMG  02-2023  ([facility]); has declined clinical exams before.    -Colonoscopy:  04/18/2008, hemorrhoids. Cscope 12-2016 (done for colitis): no plyps.  10-year recall per GI letter -Palpable Ao: [place] neg for AAA 6-10 -diet-exercise:    -Labs: CMP FLP CBC - [facility] POA on file Other issues addressed HTN: BP looks good, recommend to check ambulatory BPs, continue Lotrel.  Checking labs. Hyperlipidemia: On simvastatin 20 mg, check FLP. Osteopenia: T-score 12-2021: -1.9-stable. She is active, takes walks, on vitamin D supplements. Breast cancer prevention, + FH breast cancer: On Evista long-term, patient wonders if it needs to be stopped, I found no clear diagnosis.  Continue for now Anxiety, insomnia, depression: + Stress lately, on clonazepam.  Listening therapy provided. PDMP okay, clonazepam refilled.  Alopecia: On finasteride RTC 1 year.  Sooner if needed

## 2024-01-07 ENCOUNTER — Encounter: Payer: Self-pay | Admitting: Internal Medicine

## 2024-01-07 NOTE — Assessment & Plan Note (Signed)
 Here for CPX  Other issues addressed HTN: BP looks good, recommend to check ambulatory BPs, continue Lotrel.  Checking labs. Hyperlipidemia: On simvastatin 20 mg, check FLP. Osteopenia: T-score 12-2021: -1.9-stable. She is active, takes walks, on vitamin D supplements. Breast cancer prevention, + FH breast cancer: On Evista long-term, patient wonders if it needs to be stopped, I found no clear diagnosis.  Continue for now Anxiety, insomnia, depression: + Stress lately, on clonazepam.  Listening therapy provided. PDMP okay, clonazepam refilled.  Alopecia: On finasteride RTC 1 year.  Sooner if needed

## 2024-01-07 NOTE — Assessment & Plan Note (Signed)
 Here for CPX -Td 05/2023 -PNM 13: 05-2019; PNM 23: 11-2019 (had a local reaction) - COVID VAX done 07-2023 - Rec: RSV,  Shingrex , Flu shot every fall    -Female care: Sees gyn   - Strong FH breast ca, pt reports previous  genetic testing: negative; MMG  02-2023  ([facility]); has declined clinical exams before.    -Colonoscopy:  04/18/2008, hemorrhoids. Cscope 12-2016 (done for colitis): no plyps.  10-year recall per GI letter -Palpable Ao: [place] neg for AAA 6-10 -diet-exercise:    -Labs: CMP FLP CBC - Healthcare POA on file

## 2024-01-09 LAB — DRUG MONITORING PANEL 375977 , URINE
Alcohol Metabolites: NEGATIVE ng/mL (ref <500)
Alphahydroxyalprazolam: NEGATIVE ng/mL (ref <25)
Alphahydroxymidazolam: NEGATIVE ng/mL (ref <50)
Alphahydroxytriazolam: NEGATIVE ng/mL (ref <50)
Aminoclonazepam: 467 ng/mL — ABNORMAL HIGH (ref <25)
Amphetamines: NEGATIVE ng/mL (ref <500)
Barbiturates: NEGATIVE ng/mL (ref <300)
Benzodiazepines: POSITIVE ng/mL — AB (ref <100)
Cocaine Metabolite: NEGATIVE ng/mL (ref <150)
Desmethyltramadol: NEGATIVE ng/mL (ref <100)
Hydroxyethylflurazepam: NEGATIVE ng/mL (ref <50)
Lorazepam: NEGATIVE ng/mL (ref <50)
Marijuana Metabolite: NEGATIVE ng/mL (ref <20)
Nordiazepam: NEGATIVE ng/mL (ref <50)
Opiates: NEGATIVE ng/mL (ref <100)
Oxazepam: NEGATIVE ng/mL (ref <50)
Oxycodone: NEGATIVE ng/mL (ref <100)
Temazepam: NEGATIVE ng/mL (ref <50)
Tramadol: NEGATIVE ng/mL (ref <100)

## 2024-01-09 LAB — DM TEMPLATE

## 2024-01-12 DIAGNOSIS — F411 Generalized anxiety disorder: Secondary | ICD-10-CM | POA: Diagnosis not present

## 2024-01-26 DIAGNOSIS — F411 Generalized anxiety disorder: Secondary | ICD-10-CM | POA: Diagnosis not present

## 2024-01-28 ENCOUNTER — Ambulatory Visit: Admitting: Obstetrics & Gynecology

## 2024-01-28 VITALS — BP 148/79 | HR 85 | Ht 64.0 in | Wt 122.0 lb

## 2024-01-28 DIAGNOSIS — N904 Leukoplakia of vulva: Secondary | ICD-10-CM | POA: Diagnosis not present

## 2024-01-28 NOTE — Progress Notes (Signed)
 History:  78 y.o. [id number] here today for f/u of lichen scleroses.  Pt denies any sx. No itching or burning.  UTD with annual screening. About to see a doc at [facility] for her shoulder pain.   The following portions of the patient's history were reviewed and updated as appropriate: allergies, current medications, past family history, past medical history, past social history, past surgical history and problem list.  Review of Systems:  Pertinent items are noted in HPI.    Objective:  Physical Exam Blood pressure (!) 148/79, pulse 85, height 5\' 4"  (1.626 m), weight 122 lb (55.3 kg).  CONSTITUTIONAL: Well-developed, well-nourished female in no acute distress.  HENT:  Normocephalic, atraumatic EYES: Conjunctivae and EOM are normal. No scleral icterus.  NECK: Normal range of motion SKIN: Skin is warm and dry. No rash noted. Not diaphoretic.No pallor. NEUROLGIC: Alert and oriented to person, place, and time. Normal coordination.  Abd: Soft, nontender and nondistended Pelvic: Normal appearing external genitalia; normal appearing vaginal mucosa and cervix.  Normal discharge.  Small uterus, no other palpable masses, no uterine or adnexal tenderness. Completely benign exam.    Assessment & Plan:  Cristalle was seen today for gynecologic exam.  Diagnoses and all orders for this visit:  Lichen sclerosus of female genitalia   Do well. Will have mammogram ordered by primary care provider.  Rec f/u in 6 months.   Khaleef Ruby L. Harraway-Smith, M.D., FACOG

## 2024-01-30 DIAGNOSIS — M25521 Pain in right elbow: Secondary | ICD-10-CM | POA: Diagnosis not present

## 2024-01-30 DIAGNOSIS — M1120 Other chondrocalcinosis, unspecified site: Secondary | ICD-10-CM | POA: Diagnosis not present

## 2024-01-30 DIAGNOSIS — Z79899 Other long term (current) drug therapy: Secondary | ICD-10-CM | POA: Diagnosis not present

## 2024-01-30 DIAGNOSIS — M1990 Unspecified osteoarthritis, unspecified site: Secondary | ICD-10-CM | POA: Diagnosis not present

## 2024-02-08 ENCOUNTER — Other Ambulatory Visit: Payer: Self-pay | Admitting: Internal Medicine

## 2024-02-09 DIAGNOSIS — F411 Generalized anxiety disorder: Secondary | ICD-10-CM | POA: Diagnosis not present

## 2024-02-17 DIAGNOSIS — L658 Other specified nonscarring hair loss: Secondary | ICD-10-CM | POA: Diagnosis not present

## 2024-02-17 DIAGNOSIS — L661 Lichen planopilaris, unspecified: Secondary | ICD-10-CM | POA: Diagnosis not present

## 2024-02-23 DIAGNOSIS — F411 Generalized anxiety disorder: Secondary | ICD-10-CM | POA: Diagnosis not present

## 2024-03-03 DIAGNOSIS — Z1231 Encounter for screening mammogram for malignant neoplasm of breast: Secondary | ICD-10-CM | POA: Diagnosis not present

## 2024-03-03 LAB — HM MAMMOGRAPHY

## 2024-03-08 DIAGNOSIS — F411 Generalized anxiety disorder: Secondary | ICD-10-CM | POA: Diagnosis not present

## 2024-03-10 DIAGNOSIS — M7711 Lateral epicondylitis, right elbow: Secondary | ICD-10-CM | POA: Diagnosis not present

## 2024-03-10 DIAGNOSIS — M7521 Bicipital tendinitis, right shoulder: Secondary | ICD-10-CM | POA: Diagnosis not present

## 2024-03-10 DIAGNOSIS — M75101 Unspecified rotator cuff tear or rupture of right shoulder, not specified as traumatic: Secondary | ICD-10-CM | POA: Diagnosis not present

## 2024-03-10 DIAGNOSIS — M778 Other enthesopathies, not elsewhere classified: Secondary | ICD-10-CM | POA: Diagnosis not present

## 2024-03-10 DIAGNOSIS — M19011 Primary osteoarthritis, right shoulder: Secondary | ICD-10-CM | POA: Diagnosis not present

## 2024-03-10 DIAGNOSIS — M85821 Other specified disorders of bone density and structure, right upper arm: Secondary | ICD-10-CM | POA: Diagnosis not present

## 2024-03-16 ENCOUNTER — Encounter: Payer: Self-pay | Admitting: Internal Medicine

## 2024-03-16 MED ORDER — DICLOFENAC SODIUM 50 MG PO TBEC: 50.0000 mg | DELAYED_RELEASE_TABLET | Freq: Two times a day (BID) | ORAL | 1 refills | Status: AC | PRN

## 2024-03-18 ENCOUNTER — Encounter: Payer: Self-pay | Admitting: Internal Medicine

## 2024-03-22 ENCOUNTER — Encounter: Payer: Self-pay | Admitting: Internal Medicine

## 2024-03-23 ENCOUNTER — Encounter: Payer: Self-pay | Admitting: Internal Medicine

## 2024-03-23 DIAGNOSIS — F411 Generalized anxiety disorder: Secondary | ICD-10-CM | POA: Diagnosis not present

## 2024-03-23 MED ORDER — CLONAZEPAM 0.5 MG PO TABS: 0.5000 mg | ORAL_TABLET | Freq: Four times a day (QID) | ORAL | 1 refills | Status: DC | PRN

## 2024-03-27 ENCOUNTER — Encounter: Payer: Self-pay | Admitting: Obstetrics & Gynecology

## 2024-03-27 ENCOUNTER — Encounter: Payer: Self-pay | Admitting: Internal Medicine

## 2024-03-28 ENCOUNTER — Other Ambulatory Visit: Payer: Self-pay | Admitting: Internal Medicine

## 2024-04-02 DIAGNOSIS — M19011 Primary osteoarthritis, right shoulder: Secondary | ICD-10-CM | POA: Diagnosis not present

## 2024-04-05 DIAGNOSIS — F411 Generalized anxiety disorder: Secondary | ICD-10-CM | POA: Diagnosis not present

## 2024-04-19 DIAGNOSIS — F411 Generalized anxiety disorder: Secondary | ICD-10-CM | POA: Diagnosis not present

## 2024-05-03 DIAGNOSIS — F411 Generalized anxiety disorder: Secondary | ICD-10-CM | POA: Diagnosis not present

## 2024-05-17 ENCOUNTER — Encounter: Payer: Self-pay | Admitting: Internal Medicine

## 2024-05-17 DIAGNOSIS — F411 Generalized anxiety disorder: Secondary | ICD-10-CM | POA: Diagnosis not present

## 2024-05-17 DIAGNOSIS — N904 Leukoplakia of vulva: Secondary | ICD-10-CM

## 2024-05-18 ENCOUNTER — Other Ambulatory Visit: Payer: Self-pay | Admitting: Internal Medicine

## 2024-05-18 MED ORDER — HYDROCORTISONE 2.5 % EX CREA: TOPICAL_CREAM | Freq: Two times a day (BID) | CUTANEOUS | 1 refills | Status: AC | PRN

## 2024-05-18 NOTE — Telephone Encounter (Signed)
 Please enter a referral to gynecology.

## 2024-05-18 NOTE — Telephone Encounter (Signed)
 Referral placed.

## 2024-05-18 NOTE — Addendum Note (Signed)
 Addended by: Alesha Jaffee D on: 05/18/2024 10:23 AM   Modules accepted: Orders

## 2024-05-31 DIAGNOSIS — F411 Generalized anxiety disorder: Secondary | ICD-10-CM | POA: Diagnosis not present

## 2024-06-13 ENCOUNTER — Encounter: Payer: Self-pay | Admitting: Internal Medicine

## 2024-06-14 ENCOUNTER — Other Ambulatory Visit: Payer: Self-pay | Admitting: Family

## 2024-06-14 ENCOUNTER — Telehealth: Payer: Self-pay | Admitting: Physician Assistant

## 2024-06-14 ENCOUNTER — Other Ambulatory Visit: Payer: Self-pay | Admitting: Internal Medicine

## 2024-06-14 MED ORDER — RALOXIFENE HCL 60 MG PO TABS: 60.0000 mg | ORAL_TABLET | Freq: Every day | ORAL | 3 refills | Status: AC

## 2024-06-14 MED ORDER — CLONAZEPAM 0.5 MG PO TABS: 0.5000 mg | ORAL_TABLET | Freq: Four times a day (QID) | ORAL | 0 refills | Status: DC | PRN

## 2024-06-14 NOTE — Telephone Encounter (Signed)
 Received a call from patient regarding IBS and is requesting a phone call with nurse to discuss in depth. Please review and advise  Thank you

## 2024-06-14 NOTE — Telephone Encounter (Signed)
 Requesting: clonazepam  0.5mg   Contract:12/13/20 UDS:01/06/24 Last Visit: 01/06/24 Next Visit: None Last Refill: 03/23/24 #120 and 0RF   Please Advise

## 2024-06-14 NOTE — Telephone Encounter (Signed)
 Reports she has had good control until a few weeks ago. She is now having runs every time she eats or drinks anything. She regularly takes IBguard. Patient feels she is having an IBS flare but cannot determine what may have been the cause. We discussed diet , stress, and exercise. She is content with a natural approach. Declines offer of an appointment at this time.

## 2024-06-14 NOTE — Telephone Encounter (Signed)
 Duplicate request

## 2024-06-15 ENCOUNTER — Other Ambulatory Visit: Payer: Self-pay | Admitting: Internal Medicine

## 2024-06-15 ENCOUNTER — Telehealth: Payer: Self-pay

## 2024-06-15 NOTE — Telephone Encounter (Signed)
 Tried calling Pt back, no answer.

## 2024-06-15 NOTE — Telephone Encounter (Signed)
 Pt called office- clonazepam  sent [facility] was supposed to go to [facility].    Padonda- I have called [facility] and cancelled the clonazepam . Can you resend to [facility] please?

## 2024-06-16 ENCOUNTER — Ambulatory Visit: Admitting: *Deleted

## 2024-06-16 VITALS — Ht 64.0 in | Wt 118.0 lb

## 2024-06-16 DIAGNOSIS — Z0000 Encounter for general adult medical examination without abnormal findings: Secondary | ICD-10-CM

## 2024-06-16 MED ORDER — CLONAZEPAM 0.5 MG PO TABS: 0.5000 mg | ORAL_TABLET | Freq: Four times a day (QID) | ORAL | 1 refills | Status: DC | PRN

## 2024-06-16 NOTE — Progress Notes (Addendum)
 Please attest this visit in the absence of patient primary care provider.    Subjective:   Linda Jefferson is a 78 y.o. who presents for a Medicare Wellness preventive visit.  As a reminder, Annual Wellness Visits don't include a physical exam, and some assessments may be limited, especially if this visit is performed virtually. We may recommend an in-person follow-up visit with your provider if needed.  Visit Complete: Virtual I connected with  Rollo LITTIE Linden on 06/16/24 by a audio enabled telemedicine application and verified that I am speaking with the correct person using two identifiers.  Patient Location: Home  Provider Location: Office/Clinic  I discussed the limitations of evaluation and management by telemedicine. The patient expressed understanding and agreed to proceed.  Vital Signs: Because this visit was a virtual/telehealth visit, some criteria may be missing or patient reported. Any vitals not documented were not able to be obtained and vitals that have been documented are patient reported.  VideoDeclined- This patient declined Librarian, academic. Therefore the visit was completed with audio only.  Persons Participating in Visit: Patient.  AWV Questionnaire: Yes: Patient Medicare AWV questionnaire was completed by the patient on 06/15/24; I have confirmed that all information answered by patient is correct and no changes since this date.  Cardiac Risk Factors include: advanced age (>38men, >51 women);hypertension;dyslipidemia;Other (see comment), Risk factor comments: CAD     Objective:    Today's Vitals   06/16/24 1342  Weight: 118 lb (53.5 kg)  Height: 5' 4 (1.626 m)   Body mass index is 20.25 kg/m.     06/16/2024    1:56 PM 04/24/2023    9:45 AM 04/22/2022    8:38 AM 04/16/2021    9:51 AM 12/17/2019    9:34 AM 12/15/2018    2:36 PM 12/12/2017    2:36 PM  Advanced Directives  Does Patient Have a Medical Advance Directive? Yes  Yes Yes Yes Yes Yes  Yes   Type of Estate agent of [employer] Power of [place];Living will Healthcare Power of [place];Living will Healthcare Power of [place];Living will Healthcare Power of [place];Living will Healthcare Power of [place];Living will Healthcare Power of [place];Living will  Does patient want to make changes to medical advance directive? No - Patient declined No - Patient declined   No - Patient declined No - Patient declined    Copy of Healthcare Power of Attorney in Chart? Yes - validated most recent copy scanned in chart (See row information) Yes - validated most recent copy scanned in chart (See row information) Yes - validated most recent copy scanned in chart (See row information) Yes - validated most recent copy scanned in chart (See row information) No - copy requested No - copy requested  No - copy requested      Data saved with a previous flowsheet row definition    Current Medications (verified) Outpatient Encounter Medications as of 06/16/2024  Medication Sig   amLODipine -benazepril  (LOTREL) 5-10 MG capsule Take 1 capsule by mouth daily.   azelastine  (ASTELIN ) 0.1 % nasal spray PLACE 2 SPRAYS INTO BOTH NOSTRILS 2 (TWO) TIMES DAILY.   Biotin 5000 MCG CAPS Take 1 capsule by mouth daily.   clonazePAM  (KLONOPIN ) 0.5 MG tablet Take 1 tablet (0.5 mg total) by mouth 4 (four) times daily as needed for anxiety.   diclofenac  (VOLTAREN ) 50 MG EC tablet Take 1 tablet (50 mg total) by mouth 2 (two) times daily as needed for moderate pain (pain  score 4-6).   diclofenac  Sodium (VOLTAREN ) 1 % GEL Apply 2 g topically 4 (four) times daily as needed.   ESTRACE  VAGINAL 0.1 MG/GM vaginal cream Place 0.5 g vaginally daily. Apply to affected area.   finasteride  (PROSCAR ) 5 MG tablet Take 1 tablet (5 mg total) by mouth daily.   hydrocortisone  (ANUSOL -HC) 2.5 % rectal cream Place 1 Application rectally 2 (two) times daily. For 7 days   hydrocortisone  2.5 %  cream Apply topically 2 (two) times daily as needed.   minoxidil  (LONITEN ) 2.5 MG tablet Take 1.25 mg by mouth daily.   Multiple Vitamin (MULTIVITAMIN) capsule Take 1 capsule by mouth daily.   raloxifene  (EVISTA ) 60 MG tablet Take 1 tablet (60 mg total) by mouth daily.   simvastatin  (ZOCOR ) 20 MG tablet Take 1 tablet (20 mg total) by mouth at bedtime.   VITAMIN D PO Take 3,000 Units by mouth daily.   Artificial Tear Ointment (DRY EYES OP) Place 2 drops into both eyes as needed (for dry eyes).   [DISCONTINUED] clonazePAM  (KLONOPIN ) 0.5 MG tablet Take 1 tablet (0.5 mg total) by mouth 4 (four) times daily as needed for anxiety.   [DISCONTINUED] lidocaine  (XYLOCAINE ) 2 % jelly Apply 1 Application topically as needed.   No facility-administered encounter medications on file as of 06/16/2024.    Allergies (verified) Microplegia msa-msg [plegisol], Mushroom extract complex (obsolete), Shellfish allergy, Oxycodone, Egg-derived products, Pseudoeph-doxylamine-dm-apap, and Tape   History: Past Medical History:  Diagnosis Date   Anxiety    Carotid artery disease (HCC)    per u/s 01/2009- repeated u/s 7/11 mild dz, no stenosis   Colitis    Dyslipidemia    Food allergy    mushrooms   Gallstones    Hyperlipidemia    Hypertension    IBS (irritable bowel syndrome)    LBP (low back pain) 3/11   after a local injection, f/u [facility]   Normal cardiac stress test 04/2009   Osteopenia    Seasonal allergies    Syncope 01/2009   holter atrail tachycardia   Wears hearing aid    Past Surgical History:  Procedure Laterality Date   BUNIONECTOMY Bilateral 03/2013   CARPAL TUNNEL RELEASE Right    CATARACT EXTRACTION Bilateral 2020   Dr.Beavis   CHOLECYSTECTOMY     TOTAL SHOULDER ARTHROPLASTY Left 04/2016   TRIGGER FINGER RELEASE  03/31/2012   Procedure: RELEASE TRIGGER FINGER/A-1 PULLEY;  Surgeon: Lamar LULLA Leonor Mickey., MD;  Location: [facility];  Service: Orthopedics;  Laterality:  Right;  right index   Family History  Problem Relation Age of Onset   Anxiety disorder Mother    Coronary artery disease Mother 67   Stroke Mother    Diabetes Mother    Breast cancer Mother    Hypertension Mother    Hypertension Father    Other Father        cerebral hemorrhage   Hypertension Sister    Anxiety disorder Daughter    Anxiety disorder Son    Breast cancer Maternal Aunt        x 2   Breast cancer Paternal Aunt    Colon cancer Neg Hx    Social History   Socioeconomic History   Marital status: Married    Spouse name: Not on file   Number of children: 3   Years of education: Not on file   Highest education level: Bachelor's degree (e.g., BA, AB, BS)  Occupational History   Occupation: TEACHER-- retired  Employer: [employer]  Tobacco Use   Smoking status: Never   Smokeless tobacco: Never  Vaping Use   Vaping status: Never Used  Substance and Sexual Activity   Alcohol use: Not Currently    Alcohol/week: 2.0 standard drinks of alcohol    Types: 2 Glasses of wine per week   Drug use: No   Sexual activity: Not Currently  Other Topics Concern   Not on file  Social History Narrative   Household: pt and husband   3 children, one in [place] (PhD)               Social Drivers of Health   Financial Resource Strain: Low Risk  (06/16/2024)   Overall Financial Resource Strain (CARDIA)    Difficulty of Paying Living Expenses: Not very hard  Food Insecurity: No Food Insecurity (06/15/2024)   Hunger Vital Sign    Worried About Running Out of Food in the Last Year: Never true    Ran Out of Food in the Last Year: Never true  Transportation Needs: No Transportation Needs (06/15/2024)   PRAPARE - Administrator, Civil Service (Medical): No    Lack of Transportation (Non-Medical): No  Physical Activity: Sufficiently Active (06/15/2024)   Exercise Vital Sign    Days of Exercise per Week: 7 days    Minutes of Exercise per Session: 30 min   Stress: Stress Concern Present (06/15/2024)   [employer] of Occupational Health - Occupational Stress Questionnaire    Feeling of Stress: To some extent  Social Connections: Socially Integrated (06/15/2024)   Social Connection and Isolation Panel    Frequency of Communication with Friends and Family: Three times a week    Frequency of Social Gatherings with Friends and Family: More than three times a week    Attends Religious Services: More than 4 times per year    Active Member of [employer] or Organizations: Yes    Attends Engineer, structural: More than 4 times per year    Marital Status: Married    Tobacco Counseling Counseling given: Not Answered    Clinical Intake:  Pre-visit preparation completed: Yes  Pain : No/denies pain     BMI - recorded: 20.25 Nutritional Status: BMI of 19-24  Normal Nutritional Risks: None Diabetes: No  No results found for: HGBA1C   How often do you need to have someone help you when you read instructions, pamphlets, or other written materials from your doctor or pharmacy?: 1 - Never What is the last grade level you completed in school?: Bachelor's degree  Interpreter Needed?: No  Information entered by :: Lolita Libra, CMA(AAMA)   Activities of Daily Living     06/15/2024    2:24 PM 04/20/2024   12:22 PM  In your present state of health, do you have any difficulty performing the following activities:  Hearing? 0 0  Vision? 0 0  Difficulty concentrating or making decisions? 0 0  Walking or climbing stairs? 0 0  Dressing or bathing? 0 0  Doing errands, shopping? 0 0  Preparing Food and eating ? N N  Using the Toilet? N N  In the past six months, have you accidently leaked urine? N N  Do you have problems with loss of bowel control? N N  Managing your Medications? N N  Managing your Finances? N N  Housekeeping or managing your Housekeeping? N N    Patient Care Team: Amon Aloysius BRAVO, MD as PCP - General (  Internal  Medicine) Cristino Rush, OD as Referring Physician (Optometry) Yvone Rush, MD as Consulting Physician (Orthopedic Surgery) Rosena Skipper, MD (Inactive) as Consulting Physician (Allergy and Immunology) Elnor Rocky PARAS, PA-C (Inactive) as Physician Assistant (Rheumatology)  I have updated your Care Teams any recent Medical Services you may have received from other providers in the past year.     Assessment:   This is a routine wellness examination for Jaylnn.  Hearing/Vision screen Hearing Screening - Comments:: Has hearing aids Vision Screening - Comments:: Up to date with routine eye exams with [facility].    Goals Addressed   None    Depression Screen     01/06/2024    9:06 AM 04/24/2023    9:40 AM 01/01/2023   10:43 AM 05/21/2022    2:27 PM 04/22/2022    8:42 AM 12/27/2021    8:16 AM 04/16/2021    9:58 AM  PHQ 2/9 Scores  PHQ - 2 Score 0 0 0 0 0 0 0  PHQ- 9 Score  0         Fall Risk     06/15/2024    2:24 PM 04/20/2024   12:22 PM 01/06/2024    9:06 AM 04/23/2023   11:41 AM 01/01/2023   10:43 AM  Fall Risk   Falls in the past year? 0 0 0 0 0  Number falls in past yr: 0  0 0 0  Injury with Fall? 0  0 0 0  Risk for fall due to :    No Fall Risks   Follow up Education provided  Falls evaluation completed;Education provided Falls prevention discussed Falls evaluation completed    MEDICARE RISK AT HOME:  Medicare Risk at Home Any stairs in or around the home?: (Patient-Rptd) Yes If so, are there any without handrails?: (Patient-Rptd) No Home free of loose throw rugs in walkways, pet beds, electrical cords, etc?: (Patient-Rptd) Yes Adequate lighting in your home to reduce risk of falls?: (Patient-Rptd) Yes Life alert?: (Patient-Rptd) No Use of a cane, walker or w/c?: (Patient-Rptd) No Grab bars in the bathroom?: (Patient-Rptd) Yes Shower chair or bench in shower?: (Patient-Rptd) Yes Elevated toilet seat or a handicapped toilet?: (Patient-Rptd) No  TIMED UP AND GO:  Was the  test performed?  No,audio  Cognitive Function: 6CIT completed    11/25/2016    2:37 PM  MMSE - Mini Mental State Exam  Orientation to time 5   Orientation to Place 5   Registration 3   Attention/ Calculation 5   Recall 3   Language- name 2 objects 2   Language- repeat 1  Language- follow 3 step command 3   Language- read & follow direction 1   Write a sentence 1   Copy design 1   Total score 30      Data saved with a previous flowsheet row definition        06/16/2024    1:57 PM 04/24/2023    9:46 AM  6CIT Screen  What Year? 0 points 0 points  What month? 0 points 0 points  What time? 0 points 0 points  Count back from 20 0 points 0 points  Months in reverse 0 points 0 points  Repeat phrase 0 points 0 points  Total Score 0 points 0 points    Immunizations Immunization History  Administered Date(s) Administered   Influenza Inj Mdck Quad Pf 07/13/2019, 07/14/2020, 07/17/2021, 07/16/2022   Influenza, Mdck, Trivalent,PF 6+ MOS(egg free) 07/22/2023   Influenza, Quadrivalent, Recombinant,  Inj, Pf 08/15/2016, 08/05/2017, 07/10/2018   Moderna Covid-19 Fall Seasonal Vaccine 24yrs & older 07/30/2022, 08/01/2023   Moderna Covid-19 Seasonal Vaccine 6 months thru 79years of age 67/09/2024   [facility] Covid-19 Vaccine Bivalent Booster 41yrs & up 08/17/2021   Moderna Sars-Covid-2 Vaccination 12/05/2019, 01/02/2020, 08/22/2020, 02/19/2021   Pneumococcal Conjugate-13 05/13/2019   Pneumococcal Polysaccharide-23 11/16/2019   Td 02/04/2009   Tdap 06/06/2023    Screening Tests Health Maintenance  Topic Date Due   Zoster Vaccines- Shingrix (1 of 2) 06/16/2025 (Originally 02/19/1965)   COVID-19 Vaccine (8 - Moderna risk 2024-25 season) 09/17/2024   Medicare Annual Wellness (AWV)  06/16/2025   DTaP/Tdap/Td (3 - Td or Tdap) 06/05/2033   Pneumococcal Vaccine: 50+ Years  Completed   DEXA SCAN  Completed   Hepatitis C Screening  Completed   HPV VACCINES  Aged Out   Meningococcal B  Vaccine  Aged Out   Colonoscopy  Discontinued    Health Maintenance Items Addressed: Declines mammogram, DEXA and shingles vaccines.  Additional Screening:  Vision Screening: Recommended annual ophthalmology exams for early detection of glaucoma and other disorders of the eye. Is the patient up to date with their annual eye exam?  Yes  Who is the provider or what is the name of the office in which the patient attends annual eye exams?  [facility]  Dental Screening: Recommended annual dental exams for proper oral hygiene  Community Resource Referral / Chronic Care Management: CRR required this visit?  No   CCM required this visit?  No   Plan:    I have personally reviewed and noted the following in the patient's chart:   Medical and social history Use of alcohol, tobacco or illicit drugs  Current medications and supplements including opioid prescriptions. Patient is not currently taking opioid prescriptions. Functional ability and status Nutritional status Physical activity Advanced directives List of other physicians Hospitalizations, surgeries, and ER visits in previous 12 months Vitals Screenings to include cognitive, depression, and falls Referrals and appointments  In addition, I have reviewed and discussed with patient certain preventive protocols, quality metrics, and best practice recommendations. A written personalized care plan for preventive services as well as general preventive health recommendations were provided to patient.   Lolita Libra, CMA   06/16/2024   After Visit Summary: (MyChart) Due to this being a telephonic visit, the after visit summary with patients personalized plan was offered to patient via MyChart   Notes: Nothing significant to report at this time.

## 2024-06-16 NOTE — Patient Instructions (Signed)
 [name] , Thank you for taking time out of your busy schedule to complete your Annual Wellness Visit with me. I enjoyed our conversation and look forward to speaking with you again next year. I, as well as your care team,  appreciate your ongoing commitment to your health goals. Please review the following plan we discussed and let me know if I can assist you in the future. Your Game plan/ To Do List   Referrals: If you haven't heard from the office you've been referred to, please reach out to them at the phone provided.   Mammogram / Bone Density:  let us  know if you decide to have either of these and we will place an order for you.  Follow up Visits: Next Medicare AWV with our clinical staff: Please let us  know if you change your mind and want to schedule your next medicare wellness visit. You will be due 06/17/25 or after.   Next Office Visit with your provider: 01/07/25  10am, physical with Dr Amon  Clinician Recommendations:  Aim for 30 minutes of exercise or brisk walking, 6-8 glasses of water, and 5 servings of fruits and vegetables each day.       This is a list of the screening recommended for you and due dates:  Health Maintenance  Topic Date Due   Zoster (Shingles) Vaccine (1 of 2) Never done   Medicare Annual Wellness Visit  04/23/2024   COVID-19 Vaccine (8 - Moderna risk 2024-25 season) 09/17/2024   DTaP/Tdap/Td vaccine (3 - Td or Tdap) 06/05/2033   Pneumococcal Vaccine for age over 73  Completed   DEXA scan (bone density measurement)  Completed   Hepatitis C Screening  Completed   HPV Vaccine  Aged Out   Meningitis B Vaccine  Aged Out   Colon Cancer Screening  Discontinued    Advanced directives: (In Chart) A copy of your advanced directives are scanned into your chart should your provider ever need it. Advance Care Planning is important because it:  [x]  Makes sure you receive the medical care that is consistent with your values, goals, and preferences  [x]  It provides  guidance to your family and loved ones and reduces their decisional burden about whether or not they are making the right decisions based on your wishes.  Follow the link provided in your after visit summary or read over the paperwork we have mailed to you to help you started getting your Advance Directives in place. If you need assistance in completing these, please reach out to us  so that we can help you!  See attachments for Preventive Care and Fall Prevention Tips.

## 2024-06-22 NOTE — Telephone Encounter (Signed)
 Inbound call from patient requesting to speak with nurse Beth. Please advise.

## 2024-06-23 NOTE — Telephone Encounter (Signed)
 Patient reports she continues to struggle with her bowel movements this week. She will go back to the recommendations given by the provider at her last office visit. If this is not effective, she should come into the office for evaluation.

## 2024-06-23 NOTE — Telephone Encounter (Signed)
 Called home number. Spouse said to call the patient's mobile phone. Called mobile phone. No answer. Left message of my call.

## 2024-06-28 DIAGNOSIS — F411 Generalized anxiety disorder: Secondary | ICD-10-CM | POA: Diagnosis not present

## 2024-07-12 DIAGNOSIS — F411 Generalized anxiety disorder: Secondary | ICD-10-CM | POA: Diagnosis not present

## 2024-07-21 ENCOUNTER — Ambulatory Visit (INDEPENDENT_AMBULATORY_CARE_PROVIDER_SITE_OTHER)

## 2024-07-21 DIAGNOSIS — Z23 Encounter for immunization: Secondary | ICD-10-CM | POA: Diagnosis not present

## 2024-07-26 DIAGNOSIS — F411 Generalized anxiety disorder: Secondary | ICD-10-CM | POA: Diagnosis not present

## 2024-07-28 ENCOUNTER — Ambulatory Visit: Admitting: Obstetrics and Gynecology

## 2024-07-28 ENCOUNTER — Encounter: Payer: Self-pay | Admitting: Obstetrics and Gynecology

## 2024-07-28 VITALS — BP 129/50 | HR 74 | Wt 119.0 lb

## 2024-07-28 DIAGNOSIS — L900 Lichen sclerosus et atrophicus: Secondary | ICD-10-CM | POA: Diagnosis not present

## 2024-07-28 NOTE — Progress Notes (Signed)
   ESTABLISHED GYNECOLOGY VISIT No chief complaint on file.   Subjective:  Linda Jefferson is a 78 y.o. [id number] presenting for lichen sclerosus follow up.  Diagnosed with lichen sclerosus 05/2019 and has had regular q 6 month follow up since then. She denies any symptoms today. She has been applying cream sporadically but is uncertain whether it is estrace  or clobetasol  cream. She states she has only been using one cream. Per chart review, she had clobetasol  refilled in 07/2023   Review of Systems:   Pertinent items are noted in HPI  Pertinent History Reviewed:  Reviewed past medical,surgical, social and family history.  Reviewed problem list, medications and allergies.  Objective:   Vitals:   07/28/24 1320  BP: (!) 129/50  Pulse: 74  Weight: 119 lb (54 kg)   Physical Examination:   General appearance - well appearing, and in no distress  Mental status - alert, oriented to person, place, and time  Psych:  normal mood and affect  Skin - warm and dry, normal color, no suspicious lesions noted  Abdomen - soft, nontender, nondistended, no masses or organomegaly  Pelvic -  VULVA: normal appearing vulva with no masses, tenderness or lesions, atrophic VAGINA: normal appearing vagina with normal color and discharge, no lesions    Extremities:  No swelling or varicosities noted  Chaperone present for exam  Assessment and Plan:  1. Lichen sclerosus (Primary) Reviewed maintenance steroid regimen to prevent flares and risks of vulvar cancer. She is uncertain whether she is using estrace  or clobetasol  but will check her prescription when she gets home and get back to me. If she is using estrace , will plan to give maintenance clobetasol  treatment   Return in about 6 months (around 01/26/2025).  Future Appointments  Date Time Provider Department Center  01/07/2025 10:00 AM Linda Aloysius BRAVO, MD [facility] [address] Linda Jefferson [name], MD, FACOG Obstetrician & Gynecologist, [facility] for [facility], [facility]

## 2024-07-29 ENCOUNTER — Other Ambulatory Visit: Payer: Self-pay | Admitting: Obstetrics and Gynecology

## 2024-07-29 DIAGNOSIS — L900 Lichen sclerosus et atrophicus: Secondary | ICD-10-CM

## 2024-07-29 MED ORDER — CLOBETASOL PROPIONATE 0.05 % EX OINT: TOPICAL_OINTMENT | CUTANEOUS | 5 refills | Status: AC

## 2024-07-30 ENCOUNTER — Telehealth: Payer: Self-pay

## 2024-07-30 NOTE — Telephone Encounter (Signed)
 Spoke with patient and got her scheduled on 10/27.

## 2024-08-02 ENCOUNTER — Ambulatory Visit: Admitting: Obstetrics and Gynecology

## 2024-08-02 VITALS — BP 125/60 | HR 72 | Ht 64.0 in | Wt 121.0 lb

## 2024-08-02 DIAGNOSIS — N393 Stress incontinence (female) (male): Secondary | ICD-10-CM | POA: Diagnosis not present

## 2024-08-02 DIAGNOSIS — R319 Hematuria, unspecified: Secondary | ICD-10-CM

## 2024-08-02 DIAGNOSIS — N3946 Mixed incontinence: Secondary | ICD-10-CM | POA: Diagnosis not present

## 2024-08-02 LAB — POCT URINALYSIS DIPSTICK
Bilirubin, UA: NEGATIVE
Glucose, UA: NEGATIVE
Ketones, UA: NEGATIVE
Nitrite, UA: NEGATIVE
Protein, UA: NEGATIVE
Spec Grav, UA: 1.01 (ref 1.010–1.025)
Urobilinogen, UA: NEGATIVE U/dL — AB
pH, UA: 7.5 (ref 5.0–8.0)

## 2024-08-02 NOTE — Progress Notes (Signed)
   ESTABLISHED GYNECOLOGY VISIT Chief Complaint  Patient presents with   Gynecologic Exam    Having issues with leaking with coughing and sneezing    Subjective:  Linda Jefferson is a 78 y.o. [id number] presenting for urinary leakage  She reports a gradual increase in urinary leakage over the last few years. Reports leakage with coughing/laughing/sneezing. Also reports leakage with urge to go to the bathroom but being unable to make it in time. This is not a daily occurrence but does bother her. She is using vaginal estrogen but not consistently  Review of Systems:   Pertinent items are noted in HPI  Pertinent History Reviewed:  Reviewed past medical,surgical, social and family history.  Reviewed problem list, medications and allergies.  Objective:   Vitals:   08/02/24 0934  BP: 125/60  Pulse: 72  Weight: 121 lb (54.9 kg)  Height: 5' 4 (1.626 m)   Physical Examination:   General appearance - well appearing, and in no distress  Mental status - alert, oriented to person, place, and time  Psych:  normal mood and affect    Assessment and Plan:  1. Mixed stress and urge urinary incontinence (Primary) Reviewed etiology of both stress and urge symptoms. Reviewed kegels, pelvic PT, pessary, surgical management for stress incontinence. Reviewed consistent use of vaginal estrogen for urge symptoms as well as other medical therapies. She opts for PT at this time.  - POCT Urinalysis Dipstick - Urine Culture - Ambulatory referral to Physical Therapy  2. Hematuria - f/u urine culture to exclude UTI  Future Appointments  Date Time Provider Department Center  01/07/2025 10:00 AM Amon Aloysius BRAVO, MD [facility] [address] ONEIDA [name], MD, FACOG Obstetrician & Gynecologist, [facility] for [facility], [facility]

## 2024-08-03 DIAGNOSIS — H5789 Other specified disorders of eye and adnexa: Secondary | ICD-10-CM | POA: Diagnosis not present

## 2024-08-04 ENCOUNTER — Ambulatory Visit: Payer: Self-pay | Admitting: Obstetrics and Gynecology

## 2024-08-04 DIAGNOSIS — R319 Hematuria, unspecified: Secondary | ICD-10-CM

## 2024-08-04 LAB — URINE CULTURE

## 2024-08-09 DIAGNOSIS — F411 Generalized anxiety disorder: Secondary | ICD-10-CM | POA: Diagnosis not present

## 2024-08-20 DIAGNOSIS — F411 Generalized anxiety disorder: Secondary | ICD-10-CM | POA: Diagnosis not present

## 2024-08-24 ENCOUNTER — Encounter: Payer: Self-pay | Admitting: Internal Medicine

## 2024-08-24 NOTE — Telephone Encounter (Signed)
 Duplicate request

## 2024-08-24 NOTE — Telephone Encounter (Signed)
 Requesting: clonazepam  0.5mg  Contract:12/13/20 UDS:01/06/24 Last Visit: 01/06/24 Next Visit:01/07/25 Last Refill: 06/16/24 #120 and 1RF   Please Advise

## 2024-08-25 ENCOUNTER — Other Ambulatory Visit: Payer: Self-pay | Admitting: Family

## 2024-08-25 MED ORDER — CLONAZEPAM 0.5 MG PO TABS: 0.5000 mg | ORAL_TABLET | Freq: Four times a day (QID) | ORAL | 1 refills | Status: DC | PRN

## 2024-09-06 ENCOUNTER — Other Ambulatory Visit (HOSPITAL_BASED_OUTPATIENT_CLINIC_OR_DEPARTMENT_OTHER): Payer: Self-pay

## 2024-09-06 ENCOUNTER — Ambulatory Visit: Admitting: Family Medicine

## 2024-09-06 ENCOUNTER — Ambulatory Visit: Payer: Self-pay

## 2024-09-06 VITALS — BP 110/64 | HR 83 | Temp 98.0°F | Ht 64.0 in | Wt 120.8 lb

## 2024-09-06 DIAGNOSIS — B029 Zoster without complications: Secondary | ICD-10-CM

## 2024-09-06 DIAGNOSIS — F411 Generalized anxiety disorder: Secondary | ICD-10-CM | POA: Diagnosis not present

## 2024-09-06 MED ORDER — VALACYCLOVIR HCL 1 G PO TABS
1000.0000 mg | ORAL_TABLET | Freq: Three times a day (TID) | ORAL | 0 refills | Status: AC
  Filled 2024-09-06 (×2): qty 21, 7d supply, fill #0

## 2024-09-06 MED ORDER — VALACYCLOVIR HCL 1 G PO TABS: 1000.0000 mg | ORAL_TABLET | Freq: Three times a day (TID) | ORAL | 0 refills | Status: DC

## 2024-09-06 NOTE — Telephone Encounter (Signed)
 FYI Only or Action Required?: FYI only for provider: appointment scheduled on 09/06/2024.  Patient was last seen in primary care on 01/06/2024 by Amon Aloysius BRAVO, MD.  Called Nurse Triage reporting Eye Pain.  Symptoms began today.  Interventions attempted: Prescription medications: ointment.  Symptoms are: gradually worsening.  Triage Disposition: See HCP Within 4 Hours (Or PCP Triage)  Patient/caregiver understands and will follow disposition?: Yes  Copied from CRM #[id number]. Topic: Clinical - Red Word Triage >> Sep 06, 2024 11:20 AM Carlyon D wrote: Red Word that prompted transfer to Nurse Triage: Severe depression due to her sister pt states she is a mess, Also  possible  Shingles in L eye, irritated, red, feels like something is stuck  in her eye as well. Reason for Disposition  MODERATE eye pain or discomfort (e.g., interferes with normal activities or awakens from sleep; more than mild)  Answer Assessment - Initial Assessment Questions Pt called in stating she frequently has issues with her eyes and has had shingles in the past. She tried to call her eye doctor but was unable to get in. She has used her prescribed ointment without relief. Pt reports being under heavy level of stress d/t her sister being hospitalized out of the state. She is worried that eye irritation is going to turn into another shingles flare up. She denied any vision change or loss but that it feels that she has an eyelash stuck under her eyelid. She reports no drainage or blister formation. Appointment scheduled for evaluation. Patient agrees with plan of care, and will call back if anything changes, or if symptoms worsen.     1. ONSET: When did the pain start? (e.g., minutes, hours, days)     This morning  2. TIMING: Does the pain come and go, or has it been constant since it started? (e.g., constant, intermittent, fleeting)     New onset  3. SEVERITY: How bad is the pain?  (Scale 1-10; mild, moderate or  severe)     Denies pain; more irritated   4. LOCATION: Where does it hurt?  (e.g., eyelid, eye, cheekbone)     Eye and eyelid   5. CAUSE: What do you think is causing the pain?     Pt thinks she is getting the shingles again; using previously prescribed ointment   6. VISION: Do you have blurred vision or changes in your vision?      None; pt reports having cataracts and wears glasses   7. EYE DISCHARGE: Is there any discharge (pus) from the eye(s)?  If Yes, ask: What color is it?      None   8. FEVER: Do you have a fever? If Yes, ask: What is it, how was it measured, and when did it start?      None  9. OTHER SYMPTOMS: Do you have any other symptoms? (e.g., headache, nasal discharge, facial rash)     Nasal discharge  Protocols used: Eye Pain and Other Symptoms-A-AH

## 2024-09-06 NOTE — Telephone Encounter (Signed)
 Appt scheduled

## 2024-09-06 NOTE — Progress Notes (Signed)
 [facility] [address] [place], [place] [address] [phone]  Date:  09/06/2024   Name:  Linda Jefferson   DOB:  1945/10/18   MRN:  [id number]  PCP:  Amon Aloysius BRAVO, MD    Chief Complaint: Follow-up (Follow up from Shingles ), Eye Pain (Onset started today L eye, feels like an eyelash is in the corner of eye), and Nasal Congestion (Onset today, nose running on the left side)   History of Present Illness:  Linda Jefferson is a 77 y.o. very pleasant female patient who presents with the following:  Pt seen today with concern of eye irritation Primary pt of Dr Lucianne have not seen her myself previously  Discussed the use of AI scribe software for clinical note transcription with the patient, who gave verbal consent to proceed.  History of Present Illness Linda Jefferson is a 78 year old female with a history of shingles who presents with left eye discomfort.  She experiences discomfort in her left eye, described as a sensation of a foreign body, like 'a giant eyelash under my eyelid.' The symptoms began this morning. She does not use contact lenses and only uses glasses due to allergies. She attempted to alleviate the discomfort with a saline eye wash, which did not provide much relief.  Approximately one month ago, she visited urgent care for similar symptoms in the same eye. At that time she was given Valtrex  due to concern of possible early shingles.  Patient reports a history of frequent shingles tacks which presented in a similar way.  She completed a week-long course of Valtrex  and got back to normal until today  She reports increased stress recently, related to her sister's diagnosis of Parkinson's disease. No rashes on her skin and no changes in her vision, stating vision is normal with her glasses on.  Her past medical history includes shingles, and she has previously experienced a severe episode affecting her  waist. She has not had a recurrence of shingles for several years until the recent episodes. She is currently not taking any specific eye medications other than the saline wash she used today.  Here today accompanied by her husband who is a retired development worker, international aid.  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, broadcasting/film/video  Patient Active Problem List   Diagnosis Date Noted   Primary osteoarthritis of right knee 01/26/2020   Irritable bowel syndrome 04/03/2018   Reactive airway disease 01/27/2018   Anaphylactic shock due to adverse food reaction 01/27/2018   Back pain (MRI 06/2017: DJD, spinal stenosis) 01/09/2018   PCP NOTES >>>>> 08/12/2015   Food allergy 08/02/2015   Other allergic rhinitis 08/02/2015   Allergic cough 02/14/2015   Alopecia 08/03/2014   Anxiety 07/23/2013   Annual physical exam 08/07/2011   Osteopenia 10/22/2010   Hyperlipemia 02/18/2009   Carotid artery disease 02/17/2009   HTN (hypertension) 05/20/2008    Past Medical History:  Diagnosis Date   Anxiety    Carotid artery disease    per u/s 01/2009- repeated u/s 7/11 mild dz, no stenosis   Colitis    Dyslipidemia    Food allergy    mushrooms   Gallstones    Hyperlipidemia    Hypertension    IBS (irritable bowel syndrome)    LBP (low back pain) 3/11   after a local injection, f/u [facility]   Normal cardiac stress test 04/2009   Osteopenia  Seasonal allergies    Syncope 01/2009   holter atrail tachycardia   Wears hearing aid     Past Surgical History:  Procedure Laterality Date   BUNIONECTOMY Bilateral 03/2013   CARPAL TUNNEL RELEASE Right    CATARACT EXTRACTION Bilateral 2020   Dr.Beavis   CHOLECYSTECTOMY     TOTAL SHOULDER ARTHROPLASTY Left 04/2016   TRIGGER FINGER RELEASE  03/31/2012   Procedure: RELEASE TRIGGER FINGER/A-1 PULLEY;  Surgeon: Lamar LULLA Leonor Mickey., MD;  Location: [facility];  Service: Orthopedics;  Laterality: Right;  right index    Social History   Tobacco Use   Smoking  status: Never   Smokeless tobacco: Never  Vaping Use   Vaping status: Never Used  Substance Use Topics   Alcohol use: Not Currently    Alcohol/week: 2.0 standard drinks of alcohol    Types: 2 Glasses of wine per week   Drug use: No    Family History  Problem Relation Age of Onset   Anxiety disorder Mother    Coronary artery disease Mother 89   Stroke Mother    Diabetes Mother    Breast cancer Mother    Hypertension Mother    Hypertension Father    Other Father        cerebral hemorrhage   Hypertension Sister    Anxiety disorder Daughter    Anxiety disorder Son    Breast cancer Maternal Aunt        x 2   Breast cancer Paternal Aunt    Colon cancer Neg Hx     Allergies  Allergen Reactions   Microplegia Msa-Msg [Plegisol] Shortness Of Breath and Palpitations   Mushroom Extract Complex (Obsolete) Shortness Of Breath   Shellfish Allergy Anaphylaxis   Oxycodone Itching and Other (See Comments)    skin crawling skin crawling   Egg Protein-Containing Drug Products Other (See Comments)    Only eats egg whites. Egg yolk causes nausea. Per patient   Pseudoeph-Doxylamine-Dm-Apap     High blood pressure   Tape Rash and Other (See Comments)    Patient states use paper tape only. All other tapes cause rash. Patient states use paper tape only. All other tapes cause rash. Patient states use paper tape only. All other tapes cause rash. Patient states use paper tape only. All other tapes cause rash.    Medication list has been reviewed and updated.  Current Outpatient Medications on File Prior to Visit  Medication Sig Dispense Refill   amLODipine -benazepril  (LOTREL) 5-10 MG capsule Take 1 capsule by mouth daily. 90 capsule 2   azelastine  (ASTELIN ) 0.1 % nasal spray PLACE 2 SPRAYS INTO BOTH NOSTRILS 2 (TWO) TIMES DAILY. 30 mL 6   Biotin 5000 MCG CAPS Take 1 capsule by mouth daily.     clobetasol  ointment (TEMOVATE ) 0.05 % Apply to affected area twice weekly 45 g 5    clonazePAM  (KLONOPIN ) 0.5 MG tablet Take 1 tablet (0.5 mg total) by mouth 4 (four) times daily as needed for anxiety. 120 tablet 1   clonazePAM  (KLONOPIN ) 0.5 MG tablet Take 1 tablet (0.5 mg total) by mouth 4 (four) times daily as needed for anxiety. 120 tablet 1   diclofenac  (VOLTAREN ) 50 MG EC tablet Take 1 tablet (50 mg total) by mouth 2 (two) times daily as needed for moderate pain (pain score 4-6). 60 tablet 1   diclofenac  Sodium (VOLTAREN ) 1 % GEL Apply 2 g topically 4 (four) times daily as needed. 100 g 1  ESTRACE  VAGINAL 0.1 MG/GM vaginal cream Place 0.5 g vaginally daily. Apply to affected area. 42.5 g 4   finasteride  (PROSCAR ) 5 MG tablet Take 1 tablet (5 mg total) by mouth daily. 90 tablet 3   hydrocortisone  (ANUSOL -HC) 2.5 % rectal cream Place 1 Application rectally 2 (two) times daily. For 7 days 90 each 1   hydrocortisone  2.5 % cream Apply topically 2 (two) times daily as needed. 60 g 1   minoxidil  (LONITEN ) 2.5 MG tablet Take 1.25 mg by mouth daily.     Multiple Vitamin (MULTIVITAMIN) capsule Take 1 capsule by mouth daily.     raloxifene  (EVISTA ) 60 MG tablet Take 1 tablet (60 mg total) by mouth daily. 90 tablet 3   simvastatin  (ZOCOR ) 20 MG tablet Take 1 tablet (20 mg total) by mouth at bedtime. 90 tablet 3   VITAMIN D PO Take 3,000 Units by mouth daily.     No current facility-administered medications on file prior to visit.    Review of Systems:  As per HPI- otherwise negative.   Physical Examination: Vitals:   09/06/24 1407  BP: 110/64  Pulse: 83  Temp: 98 F (36.7 C)  SpO2: 99%   Vitals:   09/06/24 1407  Weight: 120 lb 12.8 oz (54.8 kg)  Height: 5' 4 (1.626 m)   Body mass index is 20.74 kg/m. Ideal Body Weight: Weight in (lb) to have BMI = 25: 145.3  GEN: no acute distress.  Slender build, looks well HEENT: Atraumatic, Normocephalic.  There is mild injection of the left conjunctive with no visible discharge.  PEERL, no pain with extraocular mild limited  funduscopic exam is normal.  No visible abnormality of the cornea Bilateral TM wnl, oropharynx normal.  No visible rash or skin lesion on the face or scalp.  No lymphadenopathy Ears and Nose: No external deformity. CV: RRR, No M/G/R. No JVD. No thrill. No extra heart sounds. PULM: CTA B, no wheezes, crackles, rhonchi. No retractions. No resp. distress. No accessory muscle use. EXTR: No c/c/e PSYCH: Normally interactive. Conversant.    Assessment and Plan: Varicella zoster - Plan: valACYclovir  (VALTREX ) 1000 MG tablet, DISCONTINUED: valACYclovir  (VALTREX ) 1000 MG tablet  Assessment & Plan Patient seen today with concern of recurrent zoster, left eye Her physical exam does not show any strong evidence of this, but certainly Valtrex  should not be harmful.  This may actually be a nonspecific conjunctivitis  - Prescribed Valtrex  TID for one week. - Advised two doses of Valtrex  today, then TID starting tomorrow. - Instructed to contact eye doctor for corneal evaluation within a couple of days.  I asked her to please alert me if they are not able to get her seen and I am glad to make a phone call - Sent prescription to pharmacy for immediate pickup. She will let me know if there is anything else we can do to help  Signed Harlene Schroeder, MD

## 2024-09-07 DIAGNOSIS — B0239 Other herpes zoster eye disease: Secondary | ICD-10-CM | POA: Diagnosis not present

## 2024-09-22 ENCOUNTER — Other Ambulatory Visit: Payer: Self-pay

## 2024-09-22 ENCOUNTER — Ambulatory Visit: Admitting: Physical Therapy

## 2024-09-22 DIAGNOSIS — R279 Unspecified lack of coordination: Secondary | ICD-10-CM | POA: Diagnosis present

## 2024-09-22 DIAGNOSIS — R293 Abnormal posture: Secondary | ICD-10-CM | POA: Insufficient documentation

## 2024-09-22 DIAGNOSIS — N3946 Mixed incontinence: Secondary | ICD-10-CM | POA: Diagnosis not present

## 2024-09-22 DIAGNOSIS — M6281 Muscle weakness (generalized): Secondary | ICD-10-CM | POA: Insufficient documentation

## 2024-09-22 DIAGNOSIS — N393 Stress incontinence (female) (male): Secondary | ICD-10-CM | POA: Diagnosis present

## 2024-09-22 NOTE — Therapy (Signed)
 OUTPATIENT PHYSICAL THERAPY FEMALE PELVIC EVALUATION   Patient Name: Linda Jefferson MRN: [id number] DOB:[birth year], 78 y.o., female Today's Date: 09/22/2024  END OF SESSION:  PT End of Session - 09/22/24 1015     Visit Number 1    Date for Recertification  12/21/24    Authorization Type human[employer] medicare gelene)    Progress Note Due on Visit 10    PT Start Time 1015    PT Stop Time 1055    PT Time Calculation (min) 40 min    Activity Tolerance Patient tolerated treatment well    Behavior During Therapy [facility] for tasks assessed/performed          Past Medical History:  Diagnosis Date   Anxiety    Carotid artery disease    per u/s 01/2009- repeated u/s 7/11 mild dz, no stenosis   Colitis    Dyslipidemia    Food allergy    mushrooms   Gallstones    Hyperlipidemia    Hypertension    IBS (irritable bowel syndrome)    LBP (low back pain) 3/11   after a local injection, f/u [facility]   Normal cardiac stress test 04/2009   Osteopenia    Seasonal allergies    Syncope 01/2009   holter atrail tachycardia   Wears hearing aid    Past Surgical History:  Procedure Laterality Date   BUNIONECTOMY Bilateral 03/2013   CARPAL TUNNEL RELEASE Right    CATARACT EXTRACTION Bilateral 2020   Dr.Beavis   CHOLECYSTECTOMY     TOTAL SHOULDER ARTHROPLASTY Left 04/2016   TRIGGER FINGER RELEASE  03/31/2012   Procedure: RELEASE TRIGGER FINGER/A-1 PULLEY;  Surgeon: Lamar LULLA Leonor Mickey., MD;  Location: [facility];  Service: Orthopedics;  Laterality: Right;  right index   Patient Active Problem List   Diagnosis Date Noted   Primary osteoarthritis of right knee 01/26/2020   Irritable bowel syndrome 04/03/2018   Reactive airway disease 01/27/2018   Anaphylactic shock due to adverse food reaction 01/27/2018   Back pain (MRI 06/2017: DJD, spinal stenosis) 01/09/2018   PCP NOTES >>>>> 08/12/2015   Food allergy 08/02/2015   Other allergic rhinitis 08/02/2015   Allergic cough  02/14/2015   Alopecia 08/03/2014   Anxiety 07/23/2013   Annual physical exam 08/07/2011   Osteopenia 10/22/2010   Hyperlipemia 02/18/2009   Carotid artery disease 02/17/2009   HTN (hypertension) 05/20/2008    PCP: Amon Aloysius BRAVO, MD   REFERRING PROVIDER: [name] Rollo DASEN, MD  REFERRING DIAG: N39.46 (ICD-10-CM) - Mixed stress and urge urinary incontinence  THERAPY DIAG:  Stress incontinence  Muscle weakness (generalized)  Unspecified lack of coordination  Abnormal posture  Rationale for Evaluation and Treatment: Rehabilitation  ONSET DATE: chronic   SUBJECTIVE:  SUBJECTIVE STATEMENT: Needing to rush back to restroom to have an additional bladder void after emptying, has urinary incontinence with stressors (sneezing, laughing, coughing). No urinary incontinence with urge, but does feel like she doesn't empty well.  Lichens sclerosis is being followed and treated.  Fluid intake: water - 3-4 (20oz), maybe one decaf coffee   FUNCTIONAL LIMITATIONS: nothing   PERTINENT HISTORY:  Medications for current condition: no Surgeries: no Other: no Sexual abuse: No  PAIN:  Are you having pain? No   PRECAUTIONS: None  RED FLAGS: None   WEIGHT BEARING RESTRICTIONS: No  FALLS:  Has patient fallen in last 6 months? No  OCCUPATION: retired   ACTIVITY LEVEL : moderate   PLOF: Independent  PATIENT GOALS: to have no urinary incontinence    BOWEL MOVEMENT: Pain with bowel movement: No Has IBS but reports this is regularity now with diet and metamucil   URINATION: Pain with urination: No Fully empty bladder: Yes: sometimes                                          Post-void dribble: No Stream: Strong Urgency: sometimes  Frequency:not quicker than every 2 hours                                                       Nocturia: No   Leakage: Coughing, Sneezing, and Laughing Pads/briefs: No  INTERCOURSE:  Ability to have vaginal penetration Yes  but not active    PREGNANCY: Vaginal deliveries 3 Episiotomy Yes 3rd C-section deliveries 0 Currently pregnant No  PROLAPSE: None   OBJECTIVE:  Note: Objective measures were completed at Evaluation unless otherwise noted.   PATIENT SURVEYS:    PFIQ-7:  UIQ-7 14  COGNITION: Overall cognitive status: Within functional limits for tasks assessed     SENSATION: Light touch: Appears intact   FUNCTIONAL TESTS:   Bed mobility: needed min A reports Rt arm has been tender since recent mammogram   GAIT: WFL  POSTURE: rounded shoulders and forward head   LUMBARAROM/PROM:  A/PROM A/PROM  Eval (% available)  Flexion 100  Extension 100  Right lateral flexion 75  Left lateral flexion 75  Right rotation 75  Left rotation 75   (Blank rows = not tested)  LOWER EXTREMITY ROM:  Bil hamstrings and adductors limited by 25%  LOWER EXTREMITY MMT:  Bil hips grossly 3+/5 PALPATION:  General: tightness noted in bil lumbar paraspinals   Pelvic Alignment: WFL  Abdominal: no TTP                External Perineal Exam: dryness, tissue lightening with labia adhesions noted, clitoral hood adhesions noted as well                             Internal Pelvic Floor: no TTP  Patient confirms identification and approves PT to assess internal pelvic floor and treatment Yes No emotional/communication barriers or cognitive limitation. Patient is motivated to learn. Patient understands and agrees with treatment goals and plan. PT explains patient will be examined in standing, sitting, and lying down to see how their muscles and joints work. When they are ready, they will be  asked to remove their underwear so PT can examine their perineum. The patient is also given the option of providing their own chaperone as one is not provided  in our facility. The patient also has the right and is explained the right to defer or refuse any part of the evaluation or treatment including the internal exam. With the patient's consent, PT will use one gloved finger to gently assess the muscles of the pelvic floor, seeing how well it contracts and relaxes and if there is muscle symmetry. After, the patient will get dressed and PT and patient will discuss exam findings and plan of care. PT and patient discuss plan of care, schedule, attendance policy and HEP activities.  All internal or external pelvic floor assessments and/or treatments are completed with proper hand hygiene and gloves hands. If needed gloves are changed with hand hygiene during patient care time.  PELVIC MMT:   MMT eval  Vaginal 2/5, 10s, 3 reps  Internal Anal Sphincter   External Anal Sphincter   Puborectalis   (Blank rows = not tested)        TONE: Decreased   PROLAPSE: Possible anterior vaginal wall laxity noted with cough but not at rest and reduced   TODAY'S TREATMENT:                                                                                                                              DATE:   09/22/24 EVAL Examination completed, findings reviewed, pt educated on POC, HEP. Pt motivated to participate in PT and agreeable to attempt recommendations.     PATIENT EDUCATION:  Education details: [id number] Person educated: Patient Education method: Explanation, Demonstration, Tactile cues, Verbal cues, and Handouts Education comprehension: verbalized understanding, returned demonstration, verbal cues required, tactile cues required, and needs further education  HOME EXERCISE PROGRAM: [id number]  ASSESSMENT:  CLINICAL IMPRESSION: Patient is a 78 y.o. female  who was seen today for physical therapy evaluation and treatment for urinary incontinence. Pt report she does have history of lichens sclerosis but is managed, also IBS but managed with diet and reports  no concerns at this time. Pt demonstrates impaired posture, core and hip weakness, and decreased hip and spine flexibility. Patient consented to internal pelvic floor assessment vaginally this date and found to have decreased strength, endurance, and coordination. Patient benefited from verbal cues for improved technique with pelvic floor contractions and relaxation as she does have heavy compensation with core, glutes, and adductors. Pt improved slightly but reports sitting was easier to feel than hooklying. Pt would benefit from additional PT to further address deficits.    OBJECTIVE IMPAIRMENTS: decreased activity tolerance, decreased coordination, decreased endurance, decreased mobility, decreased ROM, decreased strength, increased muscle spasms, impaired flexibility, improper body mechanics, and postural dysfunction.   ACTIVITY LIMITATIONS: continence  PARTICIPATION LIMITATIONS: community activity  PERSONAL FACTORS: Time since onset of injury/illness/exacerbation are also affecting patient's functional outcome.   REHAB POTENTIAL: Good  CLINICAL DECISION  MAKING: Stable/uncomplicated  EVALUATION COMPLEXITY: Low   GOALS: Goals reviewed with patient? Yes  SHORT TERM GOALS: Target date: 10/20/24  Pt to be I with HEP for carry over and continuing recommendations for improved outcomes.   Baseline: Goal status: INITIAL  2.  Pt will be independent with the knack, urge suppression technique, and double voiding in order to improve bladder habits and decrease urinary incontinence.   Baseline:  Goal status: INITIAL   LONG TERM GOALS: Target date: 12/21/24  Pt to be I with advanced HEP for carry over and continuing recommendations for improved outcomes.   Baseline:  Goal status: INITIAL  2.  Pt to demonstrate at least 3/5 pelvic floor strength for improved pelvic stability and decreased strain at pelvic floor/ decrease leakage.   Baseline:  Goal status: INITIAL  3.  Pt to demonstrate  improved coordination of pelvic floor and breathing mechanics with 10# squat with appropriate synergistic patterns to decrease pain and leakage at least 75% of the time for improved ability to complete a 30 minute walk without strain at pelvic floor and symptoms.    Baseline:  Goal status: INITIAL  4.  Pt to report no liners needed due to decreased urinary incontinence for improved skin integrity.  Baseline:  Goal status: INITIAL   PLAN:  PT FREQUENCY: every other week  PT DURATION: 6 sessions  PLANNED INTERVENTIONS: 97110-Therapeutic exercises, 97530- Therapeutic activity, 97112- Neuromuscular re-education, 97535- Self Care, [id number]- Manual therapy, [phone]- Canalith repositioning, [id number]- Aquatic Therapy, [phone] (1-2 muscles), [id number] (3+ muscles)- Dry Needling, Patient/Family education, Taping, Joint mobilization, Spinal mobilization, Scar mobilization, DME instructions, Cryotherapy, Moist heat, and Biofeedback  PLAN FOR NEXT SESSION: knack, urge drill, coordination of pelvic floor with breathing, hip and core strength with pelvic floor activation    Darryle Navy, PT, DPT 12/17/202511:33 AM  [facility] [address] [place], [place] [address] Phone # [phone] Fax [phone]

## 2024-10-04 ENCOUNTER — Encounter: Payer: Self-pay | Admitting: Pharmacist

## 2024-10-04 NOTE — Progress Notes (Signed)
 Pharmacy Quality Measure Review  This patient is appearing on a report for being at risk of failing the adherence measure for cholesterol (statin) medications this calendar year.   Medication: simvastatin   Last fill date: 0726/2025 for 90 day supply per adherence report.   Reviewed recent refill history in Dr Annemarie database. Actual last refill date was 08/31/2024 for 90 day supply. Patient has 1 refill remaining. Next appointment with PCP is 01/07/2025.   Insurance report was not up to date. No action needed at this time.   Madelin Ray, PharmD Clinical Pharmacist [facility]

## 2024-10-14 ENCOUNTER — Ambulatory Visit: Attending: Obstetrics and Gynecology | Admitting: Physical Therapy

## 2024-10-14 DIAGNOSIS — N393 Stress incontinence (female) (male): Secondary | ICD-10-CM | POA: Insufficient documentation

## 2024-10-14 DIAGNOSIS — R293 Abnormal posture: Secondary | ICD-10-CM | POA: Insufficient documentation

## 2024-10-14 DIAGNOSIS — R279 Unspecified lack of coordination: Secondary | ICD-10-CM | POA: Insufficient documentation

## 2024-10-14 DIAGNOSIS — M6281 Muscle weakness (generalized): Secondary | ICD-10-CM | POA: Diagnosis present

## 2024-10-14 NOTE — Therapy (Signed)
 " OUTPATIENT PHYSICAL THERAPY FEMALE PELVIC TREATMENT   Patient Name: Linda Jefferson MRN: [id number] DOB:[birth year], 79 y.o., female Today's Date: 10/14/2024  END OF SESSION:  PT End of Session - 10/14/24 1150     Visit Number 2    Date for Recertification  12/21/24    Authorization Type [employer]    Authorization Time Period 09/22/2024 - 12/21/24    Authorization - Visit Number 2    Authorization - Number of Visits 6    Progress Note Due on Visit 10    PT Start Time 1148    PT Stop Time 1226    PT Time Calculation (min) 38 min    Activity Tolerance Patient tolerated treatment well    Behavior During Therapy WFL for tasks assessed/performed          Past Medical History:  Diagnosis Date   Anxiety    Carotid artery disease    per u/s 01/2009- repeated u/s 7/11 mild dz, no stenosis   Colitis    Dyslipidemia    Food allergy    mushrooms   Gallstones    Hyperlipidemia    Hypertension    IBS (irritable bowel syndrome)    LBP (low back pain) 3/11   after a local injection, f/u [facility]   Normal cardiac stress test 04/2009   Osteopenia    Seasonal allergies    Syncope 01/2009   holter atrail tachycardia   Wears hearing aid    Past Surgical History:  Procedure Laterality Date   BUNIONECTOMY Bilateral 03/2013   CARPAL TUNNEL RELEASE Right    CATARACT EXTRACTION Bilateral 2020   Dr.Beavis   CHOLECYSTECTOMY     TOTAL SHOULDER ARTHROPLASTY Left 04/2016   TRIGGER FINGER RELEASE  03/31/2012   Procedure: RELEASE TRIGGER FINGER/A-1 PULLEY;  Surgeon: Lamar LULLA Leonor Mickey., MD;  Location: [facility];  Service: Orthopedics;  Laterality: Right;  right index   Patient Active Problem List   Diagnosis Date Noted   Primary osteoarthritis of right knee 01/26/2020   Irritable bowel syndrome 04/03/2018   Reactive airway disease 01/27/2018   Anaphylactic shock due to adverse food reaction 01/27/2018   Back pain (MRI 06/2017: DJD, spinal stenosis) 01/09/2018   PCP  NOTES >>>>> 08/12/2015   Food allergy 08/02/2015   Other allergic rhinitis 08/02/2015   Allergic cough 02/14/2015   Alopecia 08/03/2014   Anxiety 07/23/2013   Annual physical exam 08/07/2011   Osteopenia 10/22/2010   Hyperlipemia 02/18/2009   Carotid artery disease 02/17/2009   HTN (hypertension) 05/20/2008    PCP: Amon Aloysius BRAVO, MD   REFERRING PROVIDER: [name] Rollo DASEN, MD  REFERRING DIAG: N39.46 (ICD-10-CM) - Mixed stress and urge urinary incontinence  THERAPY DIAG:  Stress incontinence  Muscle weakness (generalized)  Unspecified lack of coordination  Abnormal posture  Rationale for Evaluation and Treatment: Rehabilitation  ONSET DATE: chronic   SUBJECTIVE:  SUBJECTIVE STATEMENT: Bladder has been doing better, has been doing HEP daily. Did increase to twice daily. Started feeling a difference, only had once instance of urinary incontinence without cause since last visit. No other leakage even with coughing.  Had burning with urination the same day as internal assessment last visit and into next day but resolved.    Needing to rush back to restroom to have an additional bladder void after emptying, has urinary incontinence with stressors (sneezing, laughing, coughing). No urinary incontinence with urge, but does feel like she doesn't empty well.  Lichens sclerosis is being followed and treated.  Fluid intake: water - 3-4 (20oz), maybe one decaf coffee   FUNCTIONAL LIMITATIONS: nothing   PERTINENT HISTORY:  Medications for current condition: no Surgeries: no Other: no Sexual abuse: No  PAIN:  Are you having pain? No   PRECAUTIONS: None  RED FLAGS: None   WEIGHT BEARING RESTRICTIONS: No  FALLS:  Has patient fallen in last 6 months? No  OCCUPATION: retired   ACTIVITY  LEVEL : moderate   PLOF: Independent  PATIENT GOALS: to have no urinary incontinence    BOWEL MOVEMENT: Pain with bowel movement: No Has IBS but reports this is regularity now with diet and metamucil   URINATION: Pain with urination: No Fully empty bladder: Yes: sometimes                                          Post-void dribble: No Stream: Strong Urgency: sometimes  Frequency:not quicker than every 2 hours                                                      Nocturia: No   Leakage: Coughing, Sneezing, and Laughing Pads/briefs: No  INTERCOURSE:  Ability to have vaginal penetration Yes  but not active    PREGNANCY: Vaginal deliveries 3 Episiotomy Yes 3rd C-section deliveries 0 Currently pregnant No  PROLAPSE: None   OBJECTIVE:  Note: Objective measures were completed at Evaluation unless otherwise noted.   PATIENT SURVEYS:    PFIQ-7:  UIQ-7 14  COGNITION: Overall cognitive status: Within functional limits for tasks assessed     SENSATION: Light touch: Appears intact   FUNCTIONAL TESTS:   Bed mobility: needed min A reports Rt arm has been tender since recent mammogram   GAIT: WFL  POSTURE: rounded shoulders and forward head   LUMBARAROM/PROM:  A/PROM A/PROM  Eval (% available)  Flexion 100  Extension 100  Right lateral flexion 75  Left lateral flexion 75  Right rotation 75  Left rotation 75   (Blank rows = not tested)  LOWER EXTREMITY ROM:  Bil hamstrings and adductors limited by 25%  LOWER EXTREMITY MMT:  Bil hips grossly 3+/5 PALPATION:  General: tightness noted in bil lumbar paraspinals   Pelvic Alignment: WFL  Abdominal: no TTP                External Perineal Exam: dryness, tissue lightening with labia adhesions noted, clitoral hood adhesions noted as well                             Internal Pelvic Floor: no TTP  Patient confirms  identification and approves PT to assess internal pelvic floor and treatment Yes No  emotional/communication barriers or cognitive limitation. Patient is motivated to learn. Patient understands and agrees with treatment goals and plan. PT explains patient will be examined in standing, sitting, and lying down to see how their muscles and joints work. When they are ready, they will be asked to remove their underwear so PT can examine their perineum. The patient is also given the option of providing their own chaperone as one is not provided in our facility. The patient also has the right and is explained the right to defer or refuse any part of the evaluation or treatment including the internal exam. With the patient's consent, PT will use one gloved finger to gently assess the muscles of the pelvic floor, seeing how well it contracts and relaxes and if there is muscle symmetry. After, the patient will get dressed and PT and patient will discuss exam findings and plan of care. PT and patient discuss plan of care, schedule, attendance policy and HEP activities.  All internal or external pelvic floor assessments and/or treatments are completed with proper hand hygiene and gloves hands. If needed gloves are changed with hand hygiene during patient care time.  PELVIC MMT:   MMT eval  Vaginal 2/5, 10s, 3 reps  Internal Anal Sphincter   External Anal Sphincter   Puborectalis   (Blank rows = not tested)        TONE: Decreased   PROLAPSE: Possible anterior vaginal wall laxity noted with cough but not at rest and reduced   TODAY'S TREATMENT:                                                                                                                              DATE:   09/22/24 EVAL Examination completed, findings reviewed, pt educated on POC, HEP. Pt motivated to participate in PT and agreeable to attempt recommendations.    1/8/26BETHA Pander with exhale and pelvic floor contraction 2x10 Hooklying bil hip abduction 2x10 red loop with exhale and pelvic floor contraction Hooklying bil  marching red loop 2x10 with exhale and pelvic floor contraction Sidelying ball press with hip abduction 2x10 each side Sit to stands 3# with pelvic floor contraction and exhale 2x10  PATIENT EDUCATION:  Education details: [id number] Person educated: Patient Education method: Explanation, Demonstration, Tactile cues, Verbal cues, and Handouts Education comprehension: verbalized understanding, returned demonstration, verbal cues required, tactile cues required, and needs further education  HOME EXERCISE PROGRAM: [id number]  ASSESSMENT:  CLINICAL IMPRESSION: Patient is a 79 y.o. female  who was seen today for physical therapy treatment for urinary incontinence. Pt seeing improvement with bladder to now only having one instance of urinary incontinence since last visit. Pt very pleased with this. Pt tolerated session well today, is limited with sit to stands weighted resistance with Rt shoulder and elbow pain but did well with 3#. Pt did benefit from cues with coordination of breathing  with exercises but after several reps able to correct. Pt would benefit from additional PT to further address deficits.    OBJECTIVE IMPAIRMENTS: decreased activity tolerance, decreased coordination, decreased endurance, decreased mobility, decreased ROM, decreased strength, increased muscle spasms, impaired flexibility, improper body mechanics, and postural dysfunction.   ACTIVITY LIMITATIONS: continence  PARTICIPATION LIMITATIONS: community activity  PERSONAL FACTORS: Time since onset of injury/illness/exacerbation are also affecting patient's functional outcome.   REHAB POTENTIAL: Good  CLINICAL DECISION MAKING: Stable/uncomplicated  EVALUATION COMPLEXITY: Low   GOALS: Goals reviewed with patient? Yes  SHORT TERM GOALS: Target date: 10/20/24  Pt to be I with HEP for carry over and continuing recommendations for improved outcomes.   Baseline: Goal status: INITIAL  2.  Pt will be independent with the  knack, urge suppression technique, and double voiding in order to improve bladder habits and decrease urinary incontinence.   Baseline:  Goal status: INITIAL   LONG TERM GOALS: Target date: 12/21/24  Pt to be I with advanced HEP for carry over and continuing recommendations for improved outcomes.   Baseline:  Goal status: INITIAL  2.  Pt to demonstrate at least 3/5 pelvic floor strength for improved pelvic stability and decreased strain at pelvic floor/ decrease leakage.   Baseline:  Goal status: INITIAL  3.  Pt to demonstrate improved coordination of pelvic floor and breathing mechanics with 10# squat with appropriate synergistic patterns to decrease pain and leakage at least 75% of the time for improved ability to complete a 30 minute walk without strain at pelvic floor and symptoms.    Baseline:  Goal status: INITIAL  4.  Pt to report no liners needed due to decreased urinary incontinence for improved skin integrity.  Baseline:  Goal status: INITIAL   PLAN:  PT FREQUENCY: every other week  PT DURATION: 6 sessions  PLANNED INTERVENTIONS: 97110-Therapeutic exercises, 97530- Therapeutic activity, 97112- Neuromuscular re-education, 97535- Self Care, 02859- Manual therapy, [phone]- Canalith repositioning, [id number]- Aquatic Therapy, [phone] (1-2 muscles), 20561 (3+ muscles)- Dry Needling, Patient/Family education, Taping, Joint mobilization, Spinal mobilization, Scar mobilization, DME instructions, Cryotherapy, Moist heat, and Biofeedback  PLAN FOR NEXT SESSION: knack, urge drill, coordination of pelvic floor with breathing, hip and core strength with pelvic floor activation    Darryle Navy, PT, DPT 1/8/202612:28 PM  [facility] [address], [address] [place], [place] [address] Phone # [phone] Fax [phone]   "

## 2024-10-18 ENCOUNTER — Ambulatory Visit: Admitting: Physical Therapy

## 2024-10-18 DIAGNOSIS — R279 Unspecified lack of coordination: Secondary | ICD-10-CM

## 2024-10-18 DIAGNOSIS — M6281 Muscle weakness (generalized): Secondary | ICD-10-CM

## 2024-10-18 DIAGNOSIS — R293 Abnormal posture: Secondary | ICD-10-CM

## 2024-10-18 DIAGNOSIS — N393 Stress incontinence (female) (male): Secondary | ICD-10-CM | POA: Diagnosis not present

## 2024-10-18 NOTE — Therapy (Signed)
 " OUTPATIENT PHYSICAL THERAPY FEMALE PELVIC TREATMENT   Patient Name: CASIMIRA SUTPHIN MRN: [id number] DOB:02/22/1946, 79 y.o., female Today's Date: 10/18/2024  END OF SESSION:  PT End of Session - 10/18/24 1109     Visit Number 3    Date for Recertification  12/21/24    Authorization Type [employer]here    Authorization Time Period 09/22/2024 - 12/21/24    Authorization - Visit Number 3    Authorization - Number of Visits 6    Progress Note Due on Visit 10    PT Start Time 1102    PT Stop Time 1142    PT Time Calculation (min) 40 min    Activity Tolerance Patient tolerated treatment well    Behavior During Therapy [facility] for tasks assessed/performed          Past Medical History:  Diagnosis Date   Anxiety    Carotid artery disease    per u/s 01/2009- repeated u/s 7/11 mild dz, no stenosis   Colitis    Dyslipidemia    Food allergy    mushrooms   Gallstones    Hyperlipidemia    Hypertension    IBS (irritable bowel syndrome)    LBP (low back pain) 3/11   after a local injection, f/u [facility]   Normal cardiac stress test 04/2009   Osteopenia    Seasonal allergies    Syncope 01/2009   holter atrail tachycardia   Wears hearing aid    Past Surgical History:  Procedure Laterality Date   BUNIONECTOMY Bilateral 03/2013   CARPAL TUNNEL RELEASE Right    CATARACT EXTRACTION Bilateral 2020   Dr.Beavis   CHOLECYSTECTOMY     TOTAL SHOULDER ARTHROPLASTY Left 04/2016   TRIGGER FINGER RELEASE  03/31/2012   Procedure: RELEASE TRIGGER FINGER/A-1 PULLEY;  Surgeon: Lamar LULLA Leonor Mickey., MD;  Location: [facility];  Service: Orthopedics;  Laterality: Right;  right index   Patient Active Problem List   Diagnosis Date Noted   Primary osteoarthritis of right knee 01/26/2020   Irritable bowel syndrome 04/03/2018   Reactive airway disease 01/27/2018   Anaphylactic shock due to adverse food reaction 01/27/2018   Back pain (MRI 06/2017: DJD, spinal stenosis) 01/09/2018   PCP  NOTES >>>>> 08/12/2015   Food allergy 08/02/2015   Other allergic rhinitis 08/02/2015   Allergic cough 02/14/2015   Alopecia 08/03/2014   Anxiety 07/23/2013   Annual physical exam 08/07/2011   Osteopenia 10/22/2010   Hyperlipemia 02/18/2009   Carotid artery disease 02/17/2009   HTN (hypertension) 05/20/2008    PCP: Amon Aloysius BRAVO, MD   REFERRING PROVIDER: [name] Rollo DASEN, MD  REFERRING DIAG: N39.46 (ICD-10-CM) - Mixed stress and urge urinary incontinence  THERAPY DIAG:  Muscle weakness (generalized)  Unspecified lack of coordination  Abnormal posture  Rationale for Evaluation and Treatment: Rehabilitation  ONSET DATE: chronic   SUBJECTIVE:  SUBJECTIVE STATEMENT: Having no urinary incontinence, urgency getting much better too. Now only having urgency if she needs to get upstairs.    Needing to rush back to restroom to have an additional bladder void after emptying, has urinary incontinence with stressors (sneezing, laughing, coughing). No urinary incontinence with urge, but does feel like she doesn't empty well.  Lichens sclerosis is being followed and treated.  Fluid intake: water - 3-4 (20oz), maybe one decaf coffee   FUNCTIONAL LIMITATIONS: nothing   PERTINENT HISTORY:  Medications for current condition: no Surgeries: no Other: no Sexual abuse: No  PAIN:  Are you having pain? No   PRECAUTIONS: None  RED FLAGS: None   WEIGHT BEARING RESTRICTIONS: No  FALLS:  Has patient fallen in last 6 months? No  OCCUPATION: retired   ACTIVITY LEVEL : moderate   PLOF: Independent  PATIENT GOALS: to have no urinary incontinence    BOWEL MOVEMENT: Pain with bowel movement: No Has IBS but reports this is regularity now with diet and metamucil   URINATION: Pain with urination:  No Fully empty bladder: Yes: sometimes                                          Post-void dribble: No Stream: Strong Urgency: sometimes  Frequency:not quicker than every 2 hours                                                      Nocturia: No   Leakage: Coughing, Sneezing, and Laughing Pads/briefs: No  INTERCOURSE:  Ability to have vaginal penetration Yes  but not active    PREGNANCY: Vaginal deliveries 3 Episiotomy Yes 3rd C-section deliveries 0 Currently pregnant No  PROLAPSE: None   OBJECTIVE:  Note: Objective measures were completed at Evaluation unless otherwise noted.   PATIENT SURVEYS:    PFIQ-7:  UIQ-7 14  COGNITION: Overall cognitive status: Within functional limits for tasks assessed     SENSATION: Light touch: Appears intact   FUNCTIONAL TESTS:   Bed mobility: needed min A reports Rt arm has been tender since recent mammogram   GAIT: WFL  POSTURE: rounded shoulders and forward head   LUMBARAROM/PROM:  A/PROM A/PROM  Eval (% available)  Flexion 100  Extension 100  Right lateral flexion 75  Left lateral flexion 75  Right rotation 75  Left rotation 75   (Blank rows = not tested)  LOWER EXTREMITY ROM:  Bil hamstrings and adductors limited by 25%  LOWER EXTREMITY MMT:  Bil hips grossly 3+/5 PALPATION:  General: tightness noted in bil lumbar paraspinals   Pelvic Alignment: WFL  Abdominal: no TTP                External Perineal Exam: dryness, tissue lightening with labia adhesions noted, clitoral hood adhesions noted as well                             Internal Pelvic Floor: no TTP  Patient confirms identification and approves PT to assess internal pelvic floor and treatment Yes No emotional/communication barriers or cognitive limitation. Patient is motivated to learn. Patient understands and agrees with treatment goals and plan. PT explains patient will be  examined in standing, sitting, and lying down to see how their muscles  and joints work. When they are ready, they will be asked to remove their underwear so PT can examine their perineum. The patient is also given the option of providing their own chaperone as one is not provided in our facility. The patient also has the right and is explained the right to defer or refuse any part of the evaluation or treatment including the internal exam. With the patient's consent, PT will use one gloved finger to gently assess the muscles of the pelvic floor, seeing how well it contracts and relaxes and if there is muscle symmetry. After, the patient will get dressed and PT and patient will discuss exam findings and plan of care. PT and patient discuss plan of care, schedule, attendance policy and HEP activities.  All internal or external pelvic floor assessments and/or treatments are completed with proper hand hygiene and gloves hands. If needed gloves are changed with hand hygiene during patient care time.  PELVIC MMT:   MMT eval  Vaginal 2/5, 10s, 3 reps  Internal Anal Sphincter   External Anal Sphincter   Puborectalis   (Blank rows = not tested)        TONE: Decreased   PROLAPSE: Possible anterior vaginal wall laxity noted with cough but not at rest and reduced   TODAY'S TREATMENT:                                                                                                                              DATE:   09/22/24 EVAL Examination completed, findings reviewed, pt educated on POC, HEP. Pt motivated to participate in PT and agreeable to attempt recommendations.    1/8/26BETHA Pander with exhale and pelvic floor contraction 2x10 Hooklying bil hip abduction 2x10 red loop with exhale and pelvic floor contraction Hooklying bil marching red loop 2x10 with exhale and pelvic floor contraction Sidelying ball press with hip abduction 2x10 each side Sit to stands 3# with pelvic floor contraction and exhale 2x10  10/18/24: X15 pelvic floor contractions with exhale X15  pelvic floor contractions with holds 10s X15 quick flicks Blue band bil hip abduction x15 with pelvic floor contractions and exhale  Bridges x15 with pelvic floor contractions and exhale  6# sit to stands 2x10 with pelvic floor contractions and exhale  Palloffs red band 2x10 each with exhale and pelvic floor contraction standing Standing marching with red band palloff iso hold x15 each  PATIENT EDUCATION:  Education details: [id number] Person educated: Patient Education method: Explanation, Demonstration, Tactile cues, Verbal cues, and Handouts Education comprehension: verbalized understanding, returned demonstration, verbal cues required, tactile cues required, and needs further education  HOME EXERCISE PROGRAM: [id number]  ASSESSMENT:  CLINICAL IMPRESSION: Patient is a 79 y.o. female  who was seen today for physical therapy treatment for urinary incontinence. Pt continues to see improvement hasn't had leakage this week and urgency is much lower usually only with  stairs right now. Session tolerated well with progression to more standing exercises with coordination of pelvic floor and breathing. Pt does benefit from cues for core activation and coordination. Pt would benefit from additional PT to further address deficits.    OBJECTIVE IMPAIRMENTS: decreased activity tolerance, decreased coordination, decreased endurance, decreased mobility, decreased ROM, decreased strength, increased muscle spasms, impaired flexibility, improper body mechanics, and postural dysfunction.   ACTIVITY LIMITATIONS: continence  PARTICIPATION LIMITATIONS: community activity  PERSONAL FACTORS: Time since onset of injury/illness/exacerbation are also affecting patient's functional outcome.   REHAB POTENTIAL: Good  CLINICAL DECISION MAKING: Stable/uncomplicated  EVALUATION COMPLEXITY: Low   GOALS: Goals reviewed with patient? Yes  SHORT TERM GOALS: Target date: 10/20/24  Pt to be I with HEP for carry over  and continuing recommendations for improved outcomes.   Baseline: Goal status: MET 10/18/24  2.  Pt will be independent with the knack, urge suppression technique, and double voiding in order to improve bladder habits and decrease urinary incontinence.   Baseline:  Goal status: MET 10/18/24   LONG TERM GOALS: Target date: 12/21/24  Pt to be I with advanced HEP for carry over and continuing recommendations for improved outcomes.   Baseline:  Goal status: on going 10/18/24  2.  Pt to demonstrate at least 3/5 pelvic floor strength for improved pelvic stability and decreased strain at pelvic floor/ decrease leakage.   Baseline:  Goal status: on going 10/18/24  3.  Pt to demonstrate improved coordination of pelvic floor and breathing mechanics with 10# squat with appropriate synergistic patterns to decrease pain and leakage at least 75% of the time for improved ability to complete a 30 minute walk without strain at pelvic floor and symptoms.    Baseline:  Goal status: on going 10/18/24  4.  Pt to report no liners needed due to decreased urinary incontinence for improved skin integrity.  Baseline:  Goal status: on going 10/18/24   PLAN:  PT FREQUENCY: every other week  PT DURATION: 6 sessions  PLANNED INTERVENTIONS: 97110-Therapeutic exercises, 97530- Therapeutic activity, 97112- Neuromuscular re-education, 97535- Self Care, 02859- Manual therapy, [phone]- Canalith repositioning, [id number]- Aquatic Therapy, [phone] (1-2 muscles), [id number] (3+ muscles)- Dry Needling, Patient/Family education, Taping, Joint mobilization, Spinal mobilization, Scar mobilization, DME instructions, Cryotherapy, Moist heat, and Biofeedback  PLAN FOR NEXT SESSION: coordination of pelvic floor with breathing, hip and core strength with pelvic floor activation    Darryle Navy, PT, DPT 1/12/202611:42 AM  [facility] [address], [address] [place], [place] [address] Phone # [phone] Fax  [phone]   "

## 2024-10-28 ENCOUNTER — Ambulatory Visit: Admitting: Physical Therapy

## 2024-11-04 ENCOUNTER — Ambulatory Visit: Admitting: Physical Therapy

## 2024-11-04 DIAGNOSIS — N393 Stress incontinence (female) (male): Secondary | ICD-10-CM | POA: Diagnosis not present

## 2024-11-04 DIAGNOSIS — M6281 Muscle weakness (generalized): Secondary | ICD-10-CM

## 2024-11-04 DIAGNOSIS — R279 Unspecified lack of coordination: Secondary | ICD-10-CM

## 2024-11-04 NOTE — Therapy (Signed)
 " OUTPATIENT PHYSICAL THERAPY FEMALE PELVIC TREATMENT   Patient Name: Linda Jefferson MRN: [id number] DOB:Jun 06, 1946, 79 y.o., female Today's Date: 11/04/2024  END OF SESSION:  PT End of Session - 11/04/24 1020     Visit Number 4    Date for Recertification  12/21/24    Authorization Type [employer]    Authorization Time Period 09/22/2024 - 12/21/24    Authorization - Visit Number 4    Authorization - Number of Visits 6    Progress Note Due on Visit 10    PT Start Time 1016    PT Stop Time 1057    PT Time Calculation (min) 41 min    Activity Tolerance Patient tolerated treatment well    Behavior During Therapy [facility] for tasks assessed/performed           Past Medical History:  Diagnosis Date   Anxiety    Carotid artery disease    per u/s 01/2009- repeated u/s 7/11 mild dz, no stenosis   Colitis    Dyslipidemia    Food allergy    mushrooms   Gallstones    Hyperlipidemia    Hypertension    IBS (irritable bowel syndrome)    LBP (low back pain) 3/11   after a local injection, f/u [facility]   Normal cardiac stress test 04/2009   Osteopenia    Seasonal allergies    Syncope 01/2009   holter atrail tachycardia   Wears hearing aid    Past Surgical History:  Procedure Laterality Date   BUNIONECTOMY Bilateral 03/2013   CARPAL TUNNEL RELEASE Right    CATARACT EXTRACTION Bilateral 2020   Dr.Beavis   CHOLECYSTECTOMY     TOTAL SHOULDER ARTHROPLASTY Left 04/2016   TRIGGER FINGER RELEASE  03/31/2012   Procedure: RELEASE TRIGGER FINGER/A-1 PULLEY;  Surgeon: Lamar LULLA Leonor Mickey., MD;  Location: [facility];  Service: Orthopedics;  Laterality: Right;  right index   Patient Active Problem List   Diagnosis Date Noted   Primary osteoarthritis of right knee 01/26/2020   Irritable bowel syndrome 04/03/2018   Reactive airway disease 01/27/2018   Anaphylactic shock due to adverse food reaction 01/27/2018   Back pain (MRI 06/2017: DJD, spinal stenosis) 01/09/2018    PCP NOTES >>>>> 08/12/2015   Food allergy 08/02/2015   Other allergic rhinitis 08/02/2015   Allergic cough 02/14/2015   Alopecia 08/03/2014   Anxiety 07/23/2013   Annual physical exam 08/07/2011   Osteopenia 10/22/2010   Hyperlipemia 02/18/2009   Carotid artery disease 02/17/2009   HTN (hypertension) 05/20/2008    PCP: Amon Aloysius BRAVO, MD   REFERRING PROVIDER: [name] Rollo DASEN, MD  REFERRING DIAG: N39.46 (ICD-10-CM) - Mixed stress and urge urinary incontinence  THERAPY DIAG:  Muscle weakness (generalized)  Unspecified lack of coordination  Rationale for Evaluation and Treatment: Rehabilitation  ONSET DATE: chronic   SUBJECTIVE:  SUBJECTIVE STATEMENT: No longer having symptoms, reports greatly improved. Has seen medical provider for her Rt shoulder pain and reports she plans to have replacement.    Needing to rush back to restroom to have an additional bladder void after emptying, has urinary incontinence with stressors (sneezing, laughing, coughing). No urinary incontinence with urge, but does feel like she doesn't empty well.  Lichens sclerosis is being followed and treated.  Fluid intake: water - 3-4 (20oz), maybe one decaf coffee   FUNCTIONAL LIMITATIONS: nothing   PERTINENT HISTORY:  Medications for current condition: no Surgeries: no Other: no Sexual abuse: No  PAIN:  Are you having pain? No   PRECAUTIONS: None  RED FLAGS: None   WEIGHT BEARING RESTRICTIONS: No  FALLS:  Has patient fallen in last 6 months? No  OCCUPATION: retired   ACTIVITY LEVEL : moderate   PLOF: Independent  PATIENT GOALS: to have no urinary incontinence    BOWEL MOVEMENT: Pain with bowel movement: No Has IBS but reports this is regularity now with diet and metamucil   URINATION: Pain with  urination: No Fully empty bladder: Yes: sometimes                                          Post-void dribble: No Stream: Strong Urgency: sometimes  Frequency:not quicker than every 2 hours                                                      Nocturia: No   Leakage: Coughing, Sneezing, and Laughing Pads/briefs: No  INTERCOURSE:  Ability to have vaginal penetration Yes  but not active    PREGNANCY: Vaginal deliveries 3 Episiotomy Yes 3rd C-section deliveries 0 Currently pregnant No  PROLAPSE: None   OBJECTIVE:  Note: Objective measures were completed at Evaluation unless otherwise noted.   PATIENT SURVEYS:    PFIQ-7:  UIQ-7 14  COGNITION: Overall cognitive status: Within functional limits for tasks assessed     SENSATION: Light touch: Appears intact   FUNCTIONAL TESTS:   Bed mobility: needed min A reports Rt arm has been tender since recent mammogram   GAIT: WFL  POSTURE: rounded shoulders and forward head   LUMBARAROM/PROM:  A/PROM A/PROM  Eval (% available)  Flexion 100  Extension 100  Right lateral flexion 75  Left lateral flexion 75  Right rotation 75  Left rotation 75   (Blank rows = not tested)  LOWER EXTREMITY ROM:  Bil hamstrings and adductors limited by 25%  LOWER EXTREMITY MMT:  Bil hips grossly 3+/5 PALPATION:  General: tightness noted in bil lumbar paraspinals   Pelvic Alignment: WFL  Abdominal: no TTP                External Perineal Exam: dryness, tissue lightening with labia adhesions noted, clitoral hood adhesions noted as well                             Internal Pelvic Floor: no TTP  Patient confirms identification and approves PT to assess internal pelvic floor and treatment Yes No emotional/communication barriers or cognitive limitation. Patient is motivated to learn. Patient understands and agrees with treatment goals and plan. PT  explains patient will be examined in standing, sitting, and lying down to see how  their muscles and joints work. When they are ready, they will be asked to remove their underwear so PT can examine their perineum. The patient is also given the option of providing their own chaperone as one is not provided in our facility. The patient also has the right and is explained the right to defer or refuse any part of the evaluation or treatment including the internal exam. With the patient's consent, PT will use one gloved finger to gently assess the muscles of the pelvic floor, seeing how well it contracts and relaxes and if there is muscle symmetry. After, the patient will get dressed and PT and patient will discuss exam findings and plan of care. PT and patient discuss plan of care, schedule, attendance policy and HEP activities.  All internal or external pelvic floor assessments and/or treatments are completed with proper hand hygiene and gloves hands. If needed gloves are changed with hand hygiene during patient care time.  PELVIC MMT:   MMT eval  Vaginal 2/5, 10s, 3 reps  Internal Anal Sphincter   External Anal Sphincter   Puborectalis   (Blank rows = not tested)        TONE: Decreased   PROLAPSE: Possible anterior vaginal wall laxity noted with cough but not at rest and reduced   TODAY'S TREATMENT:                                                                                                                              DATE:   09/22/24 EVAL Examination completed, findings reviewed, pt educated on POC, HEP. Pt motivated to participate in PT and agreeable to attempt recommendations.    1/8/26BETHA Pander with exhale and pelvic floor contraction 2x10 Hooklying bil hip abduction 2x10 red loop with exhale and pelvic floor contraction Hooklying bil marching red loop 2x10 with exhale and pelvic floor contraction Sidelying ball press with hip abduction 2x10 each side Sit to stands 3# with pelvic floor contraction and exhale 2x10  10/18/24: X15 pelvic floor contractions with  exhale X15 pelvic floor contractions with holds 10s X15 quick flicks Blue band bil hip abduction x15 with pelvic floor contractions and exhale  Bridges x15 with pelvic floor contractions and exhale  6# sit to stands 2x10 with pelvic floor contractions and exhale  Palloffs red band 2x10 each with exhale and pelvic floor contraction standing Standing marching with red band palloff iso hold x15 each  11/04/24: Updated HEP Reviewed goals and progress Pt educated on techniques for progressing HEP as needed at home and continuing incorporating pelvic floor with all exercises.  Pt completed new HEP 2-3 reps each  PATIENT EDUCATION:  Education details: [email or web address] Person educated: Patient Education method: Explanation, Demonstration, Tactile cues, Verbal cues, and Handouts Education comprehension: verbalized understanding, returned demonstration, verbal cues required, tactile cues required, and needs further education  HOME EXERCISE PROGRAM: Access Code: [email or web address]  URL: [email or web address] Date: 11/04/2024 Prepared by: Darryle  Exercises - Seated Kegel  - 1 x daily - 7 x weekly - 1 sets - 10 reps - pelvic floor contract and holds  - 1 x daily - 7 x weekly - 1 sets - 10 reps - 5s holds - Seated Cough with Pelvic Floor Contraction and Hand to Mouth  - 1 x daily - 7 x weekly - 1 sets - 10 reps - Pillow squeezes with kegel  - 1 x daily - 7 x weekly - 1 sets - 10 reps - bridge with kegel on lift up  - 1 x daily - 7 x weekly - 1 sets - 10 reps - clamshell and marching  - 1 x daily - 7 x weekly - 1 sets - 10 reps - sit to stand with breath out and squeeze between legs  - 1 x daily - 7 x weekly - 1 sets - 10 reps - same side hand/knee press  - 1 x daily - 7 x weekly - 1 sets - 10 reps - standing band press and then standing band with marching   - 1 x daily - 7 x weekly - 1 sets - 10 reps - Modified Cat Cow on Counter  - 1 x daily - 7 x weekly - 1 sets - 10 reps - Seated Hip Internal  Rotation with Ball and Resistance  - 1 x daily - 7 x weekly - 1 sets - 10 reps - Side Stepping with Resistance at Ankles and Counter Support  - 1 x daily - 7 x weekly - 1 sets - 5 reps - squat with inhale coming down exhale coming up with kegel  - 1 x daily - 7 x weekly - 1 sets - 10 reps - 3 way hip at counter  - 1 x daily - 7 x weekly - 1 sets - 10 reps - Bird Dog on Counter  - 1 x daily - 7 x weekly - 1 sets - 10 reps - Bird Dog on Counter  - 1 x daily - 7 x weekly - 1 sets - 10 reps  ASSESSMENT:  CLINICAL IMPRESSION: Patient is a 79 y.o. female  who was seen today for physical therapy treatment for urinary incontinence. Pt continues to see improvement hasn't had leakage this week and urgency is much lower usually only with stairs right now. Session tolerated well with progression to more standing exercises with coordination of pelvic floor and breathing. Pt does benefit from cues for core activation and coordination. Pt would benefit from additional PT to further address deficits.    OBJECTIVE IMPAIRMENTS: decreased activity tolerance, decreased coordination, decreased endurance, decreased mobility, decreased ROM, decreased strength, increased muscle spasms, impaired flexibility, improper body mechanics, and postural dysfunction.   ACTIVITY LIMITATIONS: continence  PARTICIPATION LIMITATIONS: community activity  PERSONAL FACTORS: Time since onset of injury/illness/exacerbation are also affecting patient's functional outcome.   REHAB POTENTIAL: Good  CLINICAL DECISION MAKING: Stable/uncomplicated  EVALUATION COMPLEXITY: Low   GOALS: Goals reviewed with patient? Yes  SHORT TERM GOALS: Target date: 10/20/24  Pt to be I with HEP for carry over and continuing recommendations for improved outcomes.   Baseline: Goal status: MET 10/18/24  2.  Pt will be independent with the knack, urge suppression technique, and double voiding in order to improve bladder habits and decrease urinary  incontinence.   Baseline:  Goal status: MET 10/18/24   LONG TERM GOALS: Target date: 12/21/24  Pt to be  I with advanced HEP for carry over and continuing recommendations for improved outcomes.   Baseline:  Goal status: MET 10/18/24  2.  Pt to demonstrate at least 3/5 pelvic floor strength for improved pelvic stability and decreased strain at pelvic floor/ decrease leakage.   Baseline:  Goal status: not reassessed as pt reports she is no longer having symptoms   3.  Pt to demonstrate improved coordination of pelvic floor and breathing mechanics with 10# squat with appropriate synergistic patterns to decrease pain and leakage at least 75% of the time for improved ability to complete a 30 minute walk without strain at pelvic floor and symptoms.    Baseline:  Goal status: weight difficult due to shoulder pain but good mechanics with sit to stands/squats and able to hold 5#  4.  Pt to report no liners needed due to decreased urinary incontinence for improved skin integrity.  Baseline:  Goal status: MET1/12/26   PLAN:  PT FREQUENCY: every other week  PT DURATION: 6 sessions  PLANNED INTERVENTIONS: 97110-Therapeutic exercises, 97530- Therapeutic activity, 97112- Neuromuscular re-education, 97535- Self Care, 02859- Manual therapy, [phone]- Canalith repositioning, [id number]- Aquatic Therapy, [phone] (1-2 muscles), [id number] (3+ muscles)- Dry Needling, Patient/Family education, Taping, Joint mobilization, Spinal mobilization, Scar mobilization, DME instructions, Cryotherapy, Moist heat, and Biofeedback  PLAN FOR NEXT SESSION:  PHYSICAL THERAPY DISCHARGE SUMMARY  Visits from Start of Care: 4  Current functional level related to goals / functional outcomes: All ST goals met, 2/4 LTG met one deferred due to progress and pt deferred internal reassessment today as symptoms resolved, and other pt limited with shoulder pain for weighted squats    Remaining deficits: Not pelvic floor symptoms remain, pt  does have right shoulder pain and reports she has seen medical provider about this and has plan in place for care    Education / Equipment: HEP   Patient agrees to discharge. Patient goals were partially met. Patient is being discharged due to being pleased with the current functional level.    Darryle Navy, PT, DPT 11/04/2609:04 AM  [facility] [address] [place], [place] [address] Phone # [phone] Fax [phone]   "

## 2024-11-08 ENCOUNTER — Encounter: Payer: Self-pay | Admitting: Internal Medicine

## 2024-11-08 NOTE — Telephone Encounter (Signed)
 Requesting: clonazepam  0.5mg  Contract:12/13/20 UDS:01/06/24 Last Visit: 01/06/24 Next Visit:01/07/25 Last Refill: 08/25/24 #120 and 1RF

## 2024-11-09 ENCOUNTER — Telehealth: Payer: Self-pay | Admitting: Family

## 2024-11-09 MED ORDER — CLONAZEPAM 0.5 MG PO TABS: 0.5000 mg | ORAL_TABLET | Freq: Four times a day (QID) | ORAL | 1 refills | Status: DC | PRN

## 2024-11-09 NOTE — Telephone Encounter (Signed)
 PDMP okay, Rx sent

## 2024-11-09 NOTE — Telephone Encounter (Signed)
 Rx sent to wrong pharmacy- needs sent to [facility].

## 2024-11-09 NOTE — Telephone Encounter (Signed)
 Sent!

## 2024-11-10 NOTE — Telephone Encounter (Signed)
 Linda Jefferson   11/10/2024  3:16 PM  Patient called and asked to speak to Dr. Berdine nurse, regarding a message she sent to Dr. Amon. Specialist sent over CRM, requesting to speak with pcp nurse which lacked context and did not include what the patient wanted to speak to the nurse about. Resolving as e2c2 error. Upon review, outreach no longer needed by e2c2 because clinic has reached out to patient. Thank you for your submission.

## 2024-11-10 NOTE — Telephone Encounter (Signed)
 Copied from CRM #[id number]. Topic: General - Other >> Nov 09, 2024  2:10 PM Deleta RAMAN wrote: Reason for CRM: requesting to speak with pcp nurse >> Nov 09, 2024  2:17 PM CMA Lila C wrote: E2C2 agent tried reaching out to me while Pt was on the phone, was in a room w/ another Pt. However, need more information as to what Pt is calling about.

## 2024-11-11 ENCOUNTER — Ambulatory Visit: Admitting: Physical Therapy

## 2024-11-18 ENCOUNTER — Ambulatory Visit: Admitting: Physical Therapy

## 2025-01-07 ENCOUNTER — Encounter: Admitting: Internal Medicine

## 2025-02-07 ENCOUNTER — Encounter: Admitting: Internal Medicine
